# Patient Record
Sex: Female | Born: 1955 | Race: White | Hispanic: No | Marital: Single | Smoking: Never smoker
Health system: Southern US, Community
[De-identification: ages and names within clinical notes are randomized; demographics above are authoritative.]

## PROBLEM LIST (undated history)

## (undated) DIAGNOSIS — I341 Nonrheumatic mitral (valve) prolapse: Secondary | ICD-10-CM

## (undated) DIAGNOSIS — Z87442 Personal history of urinary calculi: Secondary | ICD-10-CM

## (undated) DIAGNOSIS — F32A Depression, unspecified: Secondary | ICD-10-CM

## (undated) DIAGNOSIS — D649 Anemia, unspecified: Secondary | ICD-10-CM

## (undated) DIAGNOSIS — E785 Hyperlipidemia, unspecified: Secondary | ICD-10-CM

## (undated) DIAGNOSIS — C801 Malignant (primary) neoplasm, unspecified: Secondary | ICD-10-CM

## (undated) DIAGNOSIS — R011 Cardiac murmur, unspecified: Secondary | ICD-10-CM

## (undated) DIAGNOSIS — I10 Essential (primary) hypertension: Secondary | ICD-10-CM

## (undated) DIAGNOSIS — R7303 Prediabetes: Secondary | ICD-10-CM

## (undated) DIAGNOSIS — F329 Major depressive disorder, single episode, unspecified: Secondary | ICD-10-CM

## (undated) DIAGNOSIS — G44209 Tension-type headache, unspecified, not intractable: Secondary | ICD-10-CM

## (undated) DIAGNOSIS — S72001A Fracture of unspecified part of neck of right femur, initial encounter for closed fracture: Secondary | ICD-10-CM

## (undated) DIAGNOSIS — D259 Leiomyoma of uterus, unspecified: Secondary | ICD-10-CM

## (undated) DIAGNOSIS — N959 Unspecified menopausal and perimenopausal disorder: Secondary | ICD-10-CM

## (undated) DIAGNOSIS — M25569 Pain in unspecified knee: Secondary | ICD-10-CM

## (undated) DIAGNOSIS — L719 Rosacea, unspecified: Secondary | ICD-10-CM

## (undated) DIAGNOSIS — N200 Calculus of kidney: Secondary | ICD-10-CM

## (undated) HISTORY — DX: Fracture of unspecified part of neck of right femur, initial encounter for closed fracture: S72.001A

## (undated) HISTORY — DX: Tension-type headache, unspecified, not intractable: G44.209

## (undated) HISTORY — DX: Pain in unspecified knee: M25.569

## (undated) HISTORY — PX: EYE SURGERY: SHX253

## (undated) HISTORY — DX: Rosacea, unspecified: L71.9

## (undated) HISTORY — DX: Calculus of kidney: N20.0

## (undated) HISTORY — DX: Essential (primary) hypertension: I10

## (undated) HISTORY — DX: Major depressive disorder, single episode, unspecified: F32.9

## (undated) HISTORY — DX: Leiomyoma of uterus, unspecified: D25.9

## (undated) HISTORY — DX: Cardiac murmur, unspecified: R01.1

## (undated) HISTORY — DX: Depression, unspecified: F32.A

## (undated) HISTORY — DX: Hyperlipidemia, unspecified: E78.5

## (undated) HISTORY — DX: Unspecified menopausal and perimenopausal disorder: N95.9

---

## 1997-08-19 ENCOUNTER — Encounter: Admission: RE | Admit: 1997-08-19 | Discharge: 1997-08-19 | Payer: Self-pay | Admitting: Sports Medicine

## 1997-11-26 ENCOUNTER — Encounter: Admission: RE | Admit: 1997-11-26 | Discharge: 1997-11-26 | Payer: Self-pay | Admitting: Sports Medicine

## 1998-05-24 ENCOUNTER — Encounter: Payer: Self-pay | Admitting: Internal Medicine

## 1998-05-24 ENCOUNTER — Ambulatory Visit (HOSPITAL_COMMUNITY): Admission: RE | Admit: 1998-05-24 | Discharge: 1998-05-24 | Payer: Self-pay | Admitting: Internal Medicine

## 1998-08-10 ENCOUNTER — Other Ambulatory Visit: Admission: RE | Admit: 1998-08-10 | Discharge: 1998-08-10 | Payer: Self-pay | Admitting: Obstetrics & Gynecology

## 1999-08-15 ENCOUNTER — Other Ambulatory Visit: Admission: RE | Admit: 1999-08-15 | Discharge: 1999-08-15 | Payer: Self-pay | Admitting: Obstetrics & Gynecology

## 2000-05-31 ENCOUNTER — Encounter: Payer: Self-pay | Admitting: Internal Medicine

## 2000-05-31 ENCOUNTER — Ambulatory Visit (HOSPITAL_COMMUNITY): Admission: RE | Admit: 2000-05-31 | Discharge: 2000-05-31 | Payer: Self-pay | Admitting: Internal Medicine

## 2000-09-03 ENCOUNTER — Other Ambulatory Visit: Admission: RE | Admit: 2000-09-03 | Discharge: 2000-09-03 | Payer: Self-pay | Admitting: Obstetrics & Gynecology

## 2001-04-10 ENCOUNTER — Encounter: Admission: RE | Admit: 2001-04-10 | Discharge: 2001-04-10 | Payer: Self-pay | Admitting: Sports Medicine

## 2001-06-03 ENCOUNTER — Ambulatory Visit (HOSPITAL_COMMUNITY): Admission: RE | Admit: 2001-06-03 | Discharge: 2001-06-03 | Payer: Self-pay | Admitting: Sports Medicine

## 2001-09-08 ENCOUNTER — Other Ambulatory Visit: Admission: RE | Admit: 2001-09-08 | Discharge: 2001-09-08 | Payer: Self-pay | Admitting: Obstetrics & Gynecology

## 2002-04-30 ENCOUNTER — Encounter: Admission: RE | Admit: 2002-04-30 | Discharge: 2002-04-30 | Payer: Self-pay | Admitting: Sports Medicine

## 2002-05-19 ENCOUNTER — Encounter: Admission: RE | Admit: 2002-05-19 | Discharge: 2002-05-19 | Payer: Self-pay | Admitting: Family Medicine

## 2002-06-18 ENCOUNTER — Encounter: Admission: RE | Admit: 2002-06-18 | Discharge: 2002-06-18 | Payer: Self-pay | Admitting: Family Medicine

## 2002-07-16 ENCOUNTER — Encounter: Admission: RE | Admit: 2002-07-16 | Discharge: 2002-07-16 | Payer: Self-pay | Admitting: Family Medicine

## 2002-09-11 ENCOUNTER — Other Ambulatory Visit: Admission: RE | Admit: 2002-09-11 | Discharge: 2002-09-11 | Payer: Self-pay | Admitting: Family Medicine

## 2003-05-13 ENCOUNTER — Encounter: Admission: RE | Admit: 2003-05-13 | Discharge: 2003-05-13 | Payer: Self-pay | Admitting: Sports Medicine

## 2003-07-20 ENCOUNTER — Encounter: Admission: RE | Admit: 2003-07-20 | Discharge: 2003-07-20 | Payer: Self-pay | Admitting: Family Medicine

## 2003-08-19 ENCOUNTER — Encounter: Admission: RE | Admit: 2003-08-19 | Discharge: 2003-08-19 | Payer: Self-pay | Admitting: Sports Medicine

## 2003-08-26 ENCOUNTER — Encounter: Admission: RE | Admit: 2003-08-26 | Discharge: 2003-08-26 | Payer: Self-pay | Admitting: Family Medicine

## 2003-09-09 ENCOUNTER — Encounter: Admission: RE | Admit: 2003-09-09 | Discharge: 2003-09-09 | Payer: Self-pay | Admitting: Sports Medicine

## 2003-09-13 ENCOUNTER — Other Ambulatory Visit: Admission: RE | Admit: 2003-09-13 | Discharge: 2003-09-13 | Payer: Self-pay | Admitting: Obstetrics & Gynecology

## 2004-06-08 ENCOUNTER — Ambulatory Visit: Payer: Self-pay | Admitting: Sports Medicine

## 2004-06-28 ENCOUNTER — Ambulatory Visit: Payer: Self-pay | Admitting: Family Medicine

## 2004-08-01 ENCOUNTER — Ambulatory Visit: Payer: Self-pay | Admitting: Sports Medicine

## 2004-08-07 ENCOUNTER — Encounter: Admission: RE | Admit: 2004-08-07 | Discharge: 2004-08-07 | Payer: Self-pay | Admitting: Sports Medicine

## 2004-10-05 ENCOUNTER — Other Ambulatory Visit: Admission: RE | Admit: 2004-10-05 | Discharge: 2004-10-05 | Payer: Self-pay | Admitting: Obstetrics & Gynecology

## 2005-09-20 ENCOUNTER — Ambulatory Visit: Payer: Self-pay | Admitting: Sports Medicine

## 2005-11-01 ENCOUNTER — Ambulatory Visit: Payer: Self-pay | Admitting: Family Medicine

## 2005-11-08 ENCOUNTER — Ambulatory Visit: Payer: Self-pay | Admitting: Family Medicine

## 2005-12-13 ENCOUNTER — Ambulatory Visit: Payer: Self-pay | Admitting: Sports Medicine

## 2006-06-20 DIAGNOSIS — G44209 Tension-type headache, unspecified, not intractable: Secondary | ICD-10-CM

## 2006-06-20 DIAGNOSIS — L719 Rosacea, unspecified: Secondary | ICD-10-CM

## 2006-06-20 DIAGNOSIS — D259 Leiomyoma of uterus, unspecified: Secondary | ICD-10-CM | POA: Insufficient documentation

## 2006-06-20 HISTORY — DX: Rosacea, unspecified: L71.9

## 2006-06-20 HISTORY — DX: Tension-type headache, unspecified, not intractable: G44.209

## 2006-06-20 HISTORY — DX: Leiomyoma of uterus, unspecified: D25.9

## 2006-09-26 ENCOUNTER — Ambulatory Visit: Payer: Self-pay | Admitting: Sports Medicine

## 2006-09-26 DIAGNOSIS — F329 Major depressive disorder, single episode, unspecified: Secondary | ICD-10-CM

## 2006-09-26 DIAGNOSIS — F324 Major depressive disorder, single episode, in partial remission: Secondary | ICD-10-CM | POA: Insufficient documentation

## 2006-09-26 DIAGNOSIS — I10 Essential (primary) hypertension: Secondary | ICD-10-CM | POA: Insufficient documentation

## 2006-09-26 DIAGNOSIS — M542 Cervicalgia: Secondary | ICD-10-CM | POA: Insufficient documentation

## 2006-09-26 LAB — CONVERTED CEMR LAB
Albumin: 4.7 g/dL (ref 3.5–5.2)
Alkaline Phosphatase: 41 units/L (ref 39–117)
BUN: 13 mg/dL (ref 6–23)
CO2: 27 meq/L (ref 19–32)
Cholesterol: 189 mg/dL (ref 0–200)
Glucose, Bld: 85 mg/dL (ref 70–99)
HCT: 36.8 %
HDL: 58 mg/dL (ref >39)
Hemoglobin: 13 g/dL
LDL Cholesterol: 119 mg/dL — ABNORMAL HIGH (ref 0–99)
Total Bilirubin: 1.6 mg/dL — ABNORMAL HIGH (ref 0.3–1.2)
Triglycerides: 62 mg/dL (ref <150)
WBC: 4.9 10*3/uL

## 2006-10-01 ENCOUNTER — Encounter (INDEPENDENT_AMBULATORY_CARE_PROVIDER_SITE_OTHER): Payer: Self-pay | Admitting: *Deleted

## 2006-10-02 ENCOUNTER — Telehealth: Payer: Self-pay | Admitting: *Deleted

## 2006-10-02 ENCOUNTER — Telehealth (INDEPENDENT_AMBULATORY_CARE_PROVIDER_SITE_OTHER): Payer: Self-pay | Admitting: *Deleted

## 2006-10-31 ENCOUNTER — Ambulatory Visit: Payer: Self-pay | Admitting: Sports Medicine

## 2006-11-12 ENCOUNTER — Encounter: Payer: Self-pay | Admitting: Family Medicine

## 2006-11-12 LAB — CONVERTED CEMR LAB

## 2006-12-02 ENCOUNTER — Encounter: Payer: Self-pay | Admitting: Family Medicine

## 2006-12-02 LAB — CONVERTED CEMR LAB

## 2006-12-17 ENCOUNTER — Encounter: Payer: Self-pay | Admitting: Sports Medicine

## 2007-01-09 ENCOUNTER — Ambulatory Visit: Payer: Self-pay | Admitting: Sports Medicine

## 2007-07-21 ENCOUNTER — Ambulatory Visit: Payer: Self-pay | Admitting: Sports Medicine

## 2007-10-09 ENCOUNTER — Ambulatory Visit: Payer: Self-pay | Admitting: Sports Medicine

## 2007-10-09 LAB — CONVERTED CEMR LAB
ALT: 16 units/L (ref 0–35)
AST: 16 units/L (ref 0–37)
Alkaline Phosphatase: 40 units/L (ref 39–117)
BUN: 13 mg/dL (ref 6–23)
Calcium: 10.1 mg/dL (ref 8.4–10.5)
Chloride: 103 meq/L (ref 96–112)
Creatinine, Ser: 0.69 mg/dL (ref 0.40–1.20)
HCT: 38.9 % (ref 36.0–46.0)
MCHC: 32.4 g/dL (ref 30.0–36.0)
Platelets: 258 10*3/uL (ref 150–400)
RDW: 13 % (ref 11.5–15.5)
Total Bilirubin: 1.7 mg/dL — ABNORMAL HIGH (ref 0.3–1.2)

## 2007-10-10 ENCOUNTER — Encounter: Payer: Self-pay | Admitting: Sports Medicine

## 2007-10-10 LAB — CONVERTED CEMR LAB
HDL: 61 mg/dL (ref >39)
LDL Cholesterol: 130 mg/dL — ABNORMAL HIGH (ref 0–99)
Total CHOL/HDL Ratio: 3.3
Triglycerides: 44 mg/dL (ref <150)
VLDL: 9 mg/dL (ref 0–40)

## 2008-01-14 ENCOUNTER — Telehealth: Payer: Self-pay | Admitting: *Deleted

## 2008-01-14 ENCOUNTER — Ambulatory Visit: Payer: Self-pay | Admitting: Family Medicine

## 2008-01-26 ENCOUNTER — Encounter: Payer: Self-pay | Admitting: Family Medicine

## 2008-04-02 ENCOUNTER — Ambulatory Visit: Payer: Self-pay | Admitting: Sports Medicine

## 2008-09-28 ENCOUNTER — Encounter: Payer: Self-pay | Admitting: Family Medicine

## 2008-10-04 ENCOUNTER — Telehealth: Payer: Self-pay | Admitting: *Deleted

## 2008-10-21 ENCOUNTER — Ambulatory Visit: Payer: Self-pay | Admitting: Family Medicine

## 2008-10-28 ENCOUNTER — Encounter: Payer: Self-pay | Admitting: Family Medicine

## 2008-10-28 ENCOUNTER — Ambulatory Visit: Payer: Self-pay | Admitting: Family Medicine

## 2008-10-28 LAB — CONVERTED CEMR LAB
ALT: 17 units/L (ref 0–35)
AST: 14 units/L (ref 0–37)
CO2: 26 meq/L (ref 19–32)
Calcium: 9.6 mg/dL (ref 8.4–10.5)
Chloride: 103 meq/L (ref 96–112)
Cholesterol: 227 mg/dL — ABNORMAL HIGH (ref 0–200)
Creatinine, Ser: 0.83 mg/dL (ref 0.40–1.20)
Platelets: 279 10*3/uL (ref 150–400)
Potassium: 4.7 meq/L (ref 3.5–5.3)
RDW: 12.9 % (ref 11.5–15.5)
Sodium: 141 meq/L (ref 135–145)
Total CHOL/HDL Ratio: 3.6
Total Protein: 7.2 g/dL (ref 6.0–8.3)
WBC: 5.4 10*3/uL (ref 4.0–10.5)

## 2008-11-01 ENCOUNTER — Encounter: Payer: Self-pay | Admitting: Family Medicine

## 2009-01-28 ENCOUNTER — Encounter: Payer: Self-pay | Admitting: Family Medicine

## 2009-01-28 LAB — CONVERTED CEMR LAB

## 2009-03-03 ENCOUNTER — Emergency Department (HOSPITAL_COMMUNITY): Admission: EM | Admit: 2009-03-03 | Discharge: 2009-03-04 | Payer: Self-pay | Admitting: Emergency Medicine

## 2009-03-04 ENCOUNTER — Encounter: Payer: Self-pay | Admitting: Family Medicine

## 2009-03-24 ENCOUNTER — Encounter: Payer: Self-pay | Admitting: Family Medicine

## 2009-03-31 ENCOUNTER — Encounter: Payer: Self-pay | Admitting: Family Medicine

## 2009-05-13 ENCOUNTER — Encounter: Payer: Self-pay | Admitting: Family Medicine

## 2009-10-28 ENCOUNTER — Encounter: Payer: Self-pay | Admitting: Family Medicine

## 2009-10-31 ENCOUNTER — Ambulatory Visit: Payer: Self-pay | Admitting: Family Medicine

## 2009-10-31 DIAGNOSIS — L255 Unspecified contact dermatitis due to plants, except food: Secondary | ICD-10-CM | POA: Insufficient documentation

## 2009-11-03 ENCOUNTER — Ambulatory Visit: Payer: Self-pay | Admitting: Family Medicine

## 2009-11-07 ENCOUNTER — Ambulatory Visit: Payer: Self-pay | Admitting: Family Medicine

## 2009-11-07 DIAGNOSIS — N959 Unspecified menopausal and perimenopausal disorder: Secondary | ICD-10-CM | POA: Insufficient documentation

## 2009-11-07 DIAGNOSIS — M25569 Pain in unspecified knee: Secondary | ICD-10-CM

## 2009-11-07 DIAGNOSIS — R635 Abnormal weight gain: Secondary | ICD-10-CM | POA: Insufficient documentation

## 2009-11-07 HISTORY — DX: Unspecified menopausal and perimenopausal disorder: N95.9

## 2009-11-07 HISTORY — DX: Pain in unspecified knee: M25.569

## 2009-11-07 LAB — CONVERTED CEMR LAB
BUN: 22 mg/dL (ref 6–23)
Chloride: 103 meq/L (ref 96–112)
Direct LDL: 135 mg/dL — ABNORMAL HIGH
Glucose, Bld: 82 mg/dL (ref 70–99)
Potassium: 4 meq/L (ref 3.5–5.3)
Sodium: 142 meq/L (ref 135–145)

## 2009-11-21 ENCOUNTER — Encounter: Payer: Self-pay | Admitting: Family Medicine

## 2009-11-23 ENCOUNTER — Encounter: Payer: Self-pay | Admitting: Family Medicine

## 2009-11-29 ENCOUNTER — Encounter: Payer: Self-pay | Admitting: Family Medicine

## 2009-12-02 ENCOUNTER — Encounter: Payer: Self-pay | Admitting: Family Medicine

## 2010-01-24 ENCOUNTER — Inpatient Hospital Stay (HOSPITAL_COMMUNITY): Admission: EM | Admit: 2010-01-24 | Discharge: 2010-01-27 | Payer: Self-pay | Admitting: Emergency Medicine

## 2010-01-24 HISTORY — PX: OTHER SURGICAL HISTORY: SHX169

## 2010-05-23 NOTE — Assessment & Plan Note (Signed)
Summary: f/u mon visit/eo   Vital Signs:  Patient profile:   55 year old female Height:      65.0 inches Weight:      151 pounds BMI:     25.22 BSA:     1.76 Temp:     98.3 degrees F Pulse rate:   85 / minute BP sitting:   145 / 83  Vitals Entered By: Jone Baseman CMA (November 03, 2009 9:04 AM) CC: f/u poison ivy Is Patient Diabetic? No Pain Assessment Patient in pain? no        Primary Care Provider:  Sarah Swaziland MD  CC:  f/u poison ivy.  History of Present Illness: poison ivy: overall reports it is less itchy.  she has been taking meds as prescribed.  also less swollen.  she denies fevers.  she feels some of the spots are starting to heal up as well.   Habits & Providers  Alcohol-Tobacco-Diet     Tobacco Status: never  Current Medications (verified): 1)  Fluoxetine Hcl 20 Mg  Caps (Fluoxetine Hcl) .Marland Kitchen.. 1 By Mouth Qd 2)  Lisinopril 10 Mg  Tabs (Lisinopril) .Marland Kitchen.. 1 By Mouth Qd 3)  Flexeril 10 Mg Tabs (Cyclobenzaprine Hcl) .... 1/2 To 1 Tablet By Mouth Three Times A Day As Needed Low Back Pain. 4)  Ibuprofen 800 Mg Tabs (Ibuprofen) .... One Tab By Mouth Q8 Hours As Needed Low Back Pain 5)  Tramadol Hcl 50 Mg Tabs (Tramadol Hcl) .... Take One Tab Q6h As Needed Pain 6)  Doxycycline Hyclate 100 Mg Tabs (Doxycycline Hyclate) .Marland Kitchen.. 1 By Mouth Two Times A Day For 10 Days 7)  Prednisone 10 Mg Tabs (Prednisone) .... Taper As Directed  Allergies (verified): No Known Drug Allergies  Past History:  Past medical, surgical, family and social histories (including risk factors) reviewed for relevance to current acute and chronic problems.  Past Medical History: fracture of shoulder in college  pruritis vulvae  rosacea  migraine headaches for which execedrin mig form works - while father dying  NECK PAIN, CHRONIC (ICD-723.1) DISORDER, DEPRESSIVE NEC (ICD-311), Anxiety disorder HYPERTENSION (ICD-401.9) UTERINE FIBROID (ICD-218.9)  h/o microscopic hematuria followed by  [facility]   Past Surgical History: Reviewed history from 10/09/2007 and no changes required. Colonoscopy - 04/23/2000   ETT  9.7 METs - 05/24/2001/ no repeat since then   TAB - 04/30/2002  Family History: Reviewed history from 10/21/2008 and no changes required. father - pancreatic cancer, angioplasty, hip fracture died at 1; HBP   mother - HBP, cough age 71 - still active/ living in retirement home   sister - breast cancer/ thyroid probs  (premenopausal)  Brother - healthy  Social History: Reviewed history from 10/09/2007 and no changes required. 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  no more yellow crusting.  mild surrounding erythema but no longer warm.  mildly indurated however still.  no fluctance.  along remainder of left forearm and along right wrist and right medial knees are linear streaks of blisters that appear to be healing   Impression & Recommendations:  Problem # 1:  RHUS DERMATITIS (ICD-692.6) Assessment Improved  overall improving. continue course. has appt monday for routine physical so can follow up at that time.  precepted with Dr Leveda Anna who agrees with above  Her updated medication list for this problem includes:    Prednisone 10 Mg Tabs (Prednisone) .Marland Kitchen... Taper as directed  Orders: [facility]- Est Level  3 (44010)  Complete Medication List: 1)  Fluoxetine Hcl 20 Mg Caps (Fluoxetine hcl) .Marland Kitchen.. 1 by mouth qd 2)  Lisinopril 10 Mg Tabs (Lisinopril) .Marland Kitchen.. 1 by mouth qd 3)  Flexeril 10 Mg Tabs (Cyclobenzaprine hcl)  .... 1/2 to 1 tablet by mouth three times a day as needed low back pain. 4)  Ibuprofen 800 Mg Tabs (Ibuprofen) .... One tab by mouth q8 hours as needed low back pain 5)  Tramadol Hcl 50 Mg Tabs (Tramadol hcl) .... Take one tab q6h as needed pain 6)  Doxycycline Hyclate 100 Mg Tabs (Doxycycline hyclate) .Marland Kitchen.. 1 by mouth two times a day for 10 days 7)  Prednisone 10 Mg Tabs (Prednisone) .... Taper as directed

## 2010-05-23 NOTE — Letter (Signed)
Summary: Generic Letter  [facility]  [address]   [place], [place] [address]   Phone: [phone]  Fax: [phone]    11/21/2009  April Castillo [address] [place], [place]  [address]  Dear April Castillo, Your LDL cholesterol was 135 at your last visit.  This is fine.  I would continue to try to eat plenty of fruits and vegetables and to try to include exercise in your day.  It was nice to see you last week.  Please feel free to contact us if you need anything.  Sincerely,   Garyson Stelly Swaziland MD  Appended Document: Generic Letter mailed

## 2010-05-23 NOTE — Assessment & Plan Note (Signed)
Summary: cpp/eo   Vital Signs:  Patient profile:   55 year old female Weight:      150 pounds Temp:     98.7 degrees F oral Pulse rate:   71 / minute Pulse rhythm:   regular BP sitting:   150 / 81  (right arm) Cuff size:   regular  Vitals Entered By: Loralee Pacas CMA (November 07, 2009 9:33 AM)  Serial Vital Signs/Assessments:  Time      Position  BP       Pulse  Resp  Temp     By                     142/74                         Sarah Swaziland MD  CC: cpe   Primary Care Provider:  Sarah Swaziland MD  CC:  cpe.  History of Present Illness: Pt here for CPE.  Gets paps, mammograms through Dr. Arlyce Dice.  Also with following issues:  Knee pain - [place] (s[place]pped) onto knee.  Was fine, but a few weeks ago felt sharp pain when kneeling in garden, as though glass was in knee. Felt knee was a little swollen at that time, but no redness.  No pain with walking or climbing stairs. Since then, has been careful squatting and kneeling and knee feels fine.  Feeling bloated for a few days.  Has been on prednisone since last week and feels menses is coming.  Hot flashes - not really bothering her.  Not keeping her up at night.  Some vaginal dryness, but not affecting her life.  Has irreg menses, mostly spotting every few months.    Depression/anxiety  is well controlled.  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  No problems with her HTN meds.  No chest pain or trouble breathing.    Current Medications (verified): 1)  Fluoxetine Hcl 20 Mg  Caps (Fluoxetine Hcl) .Marland Kitchen.. 1 By Mouth Qd 2)  Lisinopril 10 Mg  Tabs (Lisinopril) .Marland Kitchen.. 1 By Mouth Qd 3)  Ibuprofen 800 Mg Tabs (Ibuprofen) .... One Tab By Mouth Q8 Hours As Needed Low Back Pain 4)  Tramadol Hcl 50 Mg Tabs (Tramadol Hcl) .... Take Daily Po As Needed Pain 5)  Doxycycline Hyclate 100 Mg Tabs (Doxycycline Hyclate) .Marland Kitchen.. 1 By Mouth Two Times A Day For 10 Days 6)  Prednisone 10 Mg Tabs (Prednisone) .... Taper As  Directed  Allergies (verified): No Known Drug Allergies  Family History: father - pancreatic cancer, angioplasty, hip fracture died at 66; HBP   mother - HBP, cough age 24 - still active/ living in retirement home   sister - breast cancer - mastectomy 30 years ago/ thyroid probs  (premenopausal) alive and well.  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  The patient denies anorexia, fever, weight loss, vision loss, decreased hearing, chest pain, syncope, dyspnea on exertion, prolonged cough, headaches, hemoptysis, abdominal pain, melena, hematochezia, hematuria, incontinence, genital sores, suspicious skin lesions, depression, abnormal bleeding, and breast masses.    Physical Exam  General:  Well-developed,well-nourished,in  no acute distress; alert,appropriate and cooperative throughout examination Head:  Normocephalic and atraumatic without obvious abnormalities. No apparent alopecia or balding. Eyes:  No corneal or conjunctival inflammation noted. EOMI. Perrla.  Vision grossly normal. Ears:  External ear exam shows no significant lesions or deformities.  Otoscopic examination reveals clear canals, tympanic membranes are intact bilaterally without bulging, retraction, inflammation or discharge. Hearing is grossly normal bilaterally. Mouth:  Oral mucosa and oropharynx without lesions or exudates.  Teeth in good repair. Breasts:  No mass, nodules, thickening, tenderness, bulging, retraction, inflamation, nipple discharge or skin changes noted.   Lungs:  Normal respiratory effort, chest expands symmetrically. Lungs are clear to auscultation, no crackles or wheezes. Heart:  Normal rate and regular rhythm.  S1 and S2 normal without gallop, murmur, click, rub or other extra sounds. Abdomen:  Bowel sounds positive,abdomen soft and non-tender without masses, organomegaly or hernias noted. Msk:  B knees FROM.  Stable ant/post/med/lat.  No erythema or swelling noted. Skin:  Healing areas of plaques L forearm and R leg.  No excoriations,  no surrounding erythema.   Psych:  Cognition and judgment appear intact. Alert and cooperative with normal attention span and concentration. No apparent delusions, illusions, hallucinations   Impression & Recommendations:  Problem # 1:  Preventive Health Care (ICD-V70.0) Doing well.  Due for colonoscopy - she will check with co-workers for [employer] on GI doc with easier prep.  Due for mammogram, but can schedule self at [facility].  Breast exam benign today.  Problem # 2:  PERIMENOPAUSAL SYNDROME (ICD-627.9)  Discussed with pt. She declines any pharmacologic treatment today.  Orders: [facility] - Est  40-64 yrs ([address]81)  Problem # 3:  HYPERTENSION (ICD-401.9) Refill meds.  Repeat 142/74.  Pt would like to try dietary changes and increasing exercise before making med adjustments, and pt is so close to goal that I think this is reasonable.  Check labs today. Her updated medication list for this problem includes:    Lisinopril 10 Mg Tabs (Lisinopril) .Marland Kitchen... 1 by mouth qd  Orders: T-Basic Metabolic Panel [id number]) Direct LDL-[facility] [phone]) [facility] - Est  40-64 yrs ([address]29)  Problem # 4:  DISORDER, DEPRESSIVE NEC (ICD-311)  With anxiety.  Well controlled.  Refill meds. Her updated medication list for this problem includes:    Fluoxetine Hcl 20 Mg Caps (Fluoxetine hcl) .Marland Kitchen... 1 by mouth qd  Orders: [facility] - Est  40-64 yrs ([address])  Problem # 5:  KNEE PAIN, LEFT (ICD-719.46)  Exam benign.  F/u if worsens or affects her activities. The following medications were removed from the medication list:    Flexeril 10 Mg Tabs (Cyclobenzaprine hcl) .Marland Kitchen... 1/2 to 1 tablet by mouth  three times a day as needed low back pain. Her updated medication list for this problem includes:    Ibuprofen 800 Mg Tabs (Ibuprofen) ..... One tab by mouth q8 hours as needed low back pain    Tramadol Hcl 50 Mg Tabs (Tramadol hcl) .Marland Kitchen... Take daily po as needed pain  Orders: [facility] - Est  40-64 yrs ([address]40)  Problem # 6:  WEIGHT GAIN (ICD-783.1)  Pt feels this is due to decreasing exercise as she is now working from home.  Encouraged to try to increase exercise.  If she loses soem weight, this will likely help with HTN as well.    Orders: [facility] - Est  40-64 yrs ([address])  Problem # 7:  RHUS DERMATITIS (ICD-692.6)  Much improved.  C/W prednisone taper.   Her updated  medication list for this problem includes:    Prednisone 10 Mg Tabs (Prednisone) .Marland Kitchen... Taper as directed  Orders: [facility] - Est  40-64 yrs ([address])  Complete Medication List: 1)  Fluoxetine Hcl 20 Mg Caps (Fluoxetine hcl) .Marland Kitchen.. 1 by mouth qd 2)  Lisinopril 10 Mg Tabs (Lisinopril) .Marland Kitchen.. 1 by mouth qd 3)  Ibuprofen 800 Mg Tabs (Ibuprofen) .... One tab by mouth q8 hours as needed low back pain 4)  Tramadol Hcl 50 Mg Tabs (Tramadol hcl) .... Take daily po as needed pain 5)  Doxycycline Hyclate 100 Mg Tabs (Doxycycline hyclate) .Marland Kitchen.. 1 by mouth two times a day for 10 days 6)  Prednisone 10 Mg Tabs (Prednisone) .... Taper as directed  Patient Instructions: 1)  It was good to see you today. 2)  I think you are smart to try to add some exercise in to your day and to watch your intake of salt.  Fruits and veggies are great for you, too, and 5 servings a day  (or more) are suggested. 3)  Come see [place] in a year or sooner if you need [place].   Prevention & Chronic Care Immunizations   Influenza vaccine: Not documented    Tetanus booster: Not documented    Pneumococcal vaccine: Not documented  Colorectal Screening   Hemoccult: Not documented    Colonoscopy: Not documented  Other Screening   Pap smear: Not documented    Mammogram: Not  documented   Smoking status: never  (11/03/2009)  Lipids   Total Cholesterol: 227  (10/28/2008)   LDL: 148  (10/28/2008)   LDL Direct: Not documented   HDL: 63  (10/28/2008)   Triglycerides: 82  (10/28/2008)  Hypertension   Last Blood Pressure: 150 / 81  (11/07/2009)   Serum creatinine: 0.83  (10/28/2008)   BMP action: Ordered   Serum potassium 4.7  (10/28/2008)    Hypertension flowsheet reviewed?: Yes   Progress toward BP goal: At goal  Self-Management Support :    Hypertension self-management support: Not documented    Hypertension self-management support not done because: Good outcomes  (11/07/2009)

## 2010-05-23 NOTE — Miscellaneous (Signed)
Summary: paps and mammograms entered.     Clinical Lists Changes  Observations: Added new observation of DM PROGRESS: N/A (11/29/2009 15:12) Added new observation of DM FSREVIEW: N/A (11/29/2009 15:12) Added new observation of LIPID PROGRS: N/A (11/29/2009 15:12) Added new observation of LIPID FSREVW: N/A (11/29/2009 15:12) Added new observation of PAP SMEAR: Specimen Adequacy: Satisfactory for evaluation.   Interpretation/Result:Negative for intraepithelial Lesion or Malignancy.    (01/28/2009 15:16) Added new observation of PAP SMEAR: Specimen Adequacy: Satisfactory for evaluation.   Interpretation/Result:Negative for intraepithelial Lesion or Malignancy.    (01/26/2008 15:32) Added new observation of PAP SMEAR: Specimen Adequacy: Satisfactory for evaluation.   Interpretation/Result:Negative for intraepithelial Lesion or Malignancy.    (12/02/2006 15:44) Added new observation of MAMMOGRAM: No specific mammographic evidence of malignancy.  Assessment: BIRADS 1.  (12/02/2006 15:43) Added new observation of PAP SMEAR: Specimen Adequacy: Satisfactory for evaluation.   Interpretation/Result:Negative for intraepithelial Lesion or Malignancy.    (11/12/2006 15:46)      Prevention & Chronic Care Immunizations   Influenza vaccine: Not documented    Tetanus booster: Not documented    Pneumococcal vaccine: Not documented  Colorectal Screening   Hemoccult: Not documented    Colonoscopy: Not documented  Other Screening   Pap smear: Specimen Adequacy: Satisfactory for evaluation.   Interpretation/Result:Negative for intraepithelial Lesion or Malignancy.     (01/28/2009)    Mammogram: No specific mammographic evidence of malignancy.  Assessment: BIRADS 1.   (12/02/2006)   Smoking status: never  (11/03/2009)  Lipids   Total Cholesterol: 227  (10/28/2008)   LDL: 148  (10/28/2008)   LDL Direct: 135  (11/07/2009)   HDL: 63  (10/28/2008)   Triglycerides: 82   (10/28/2008)  Hypertension   Last Blood Pressure: 150 / 81  (11/07/2009)   Serum creatinine: 0.75  (11/07/2009)   BMP action: Ordered   Serum potassium 4.0  (11/07/2009)  Self-Management Support :    Hypertension self-management support: Not documented    Hypertension self-management support not done because: Good outcomes  (11/07/2009)   Pap Smear  Procedure date:  01/28/2009  Findings:      Specimen Adequacy: Satisfactory for evaluation.   Interpretation/Result:Negative for intraepithelial Lesion or Malignancy.     Pap Smear  Procedure date:  01/26/2008  Findings:      Specimen Adequacy: Satisfactory for evaluation.   Interpretation/Result:Negative for intraepithelial Lesion or Malignancy.     Mammogram  Procedure date:  12/02/2006  Findings:      No specific mammographic evidence of malignancy.  Assessment: BIRADS 1.   Pap Smear  Procedure date:  11/12/2006  Findings:      Specimen Adequacy: Satisfactory for evaluation.   Interpretation/Result:Negative for intraepithelial Lesion or Malignancy.    Last 3 paps entered.  Also with neg paps on: 10/11/05 10/04/04 09/14/03 09/11/02 09/08/01 09/03/00 08/15/99   Neg mammograms on 10/02/03 09/24/02 (repeat from 09/11/02, which was Bi-Rads 0) 04/07/01 06/06/00 (with ultrasound for palpable mass)

## 2010-05-23 NOTE — Assessment & Plan Note (Signed)
Summary: poison ivy,df   Vital Signs:  Patient profile:   55 year old female Height:      65.0 inches Weight:      145 pounds BMI:     24.22 BSA:     1.73 Temp:     98.2 degrees F Pulse rate:   78 / minute BP sitting:   120 / 76  Vitals Entered By: Jone Baseman CMA (October 31, 2009 10:07 AM) CC: poison ivy x 5 days Is Patient Diabetic? No Pain Assessment Patient in pain? no        Primary Care Provider:  Sarah Swaziland MD  CC:  poison ivy x 5 days.  History of Present Illness: poison ivy: broke out with it about 5 days ago. started as what she thought was a bug bite (she does spend a great deal of time outdoors and around horses) but then later this area on left forearm got swollen, painful and  she developed streaks of blisters around it, around wrists bilaterally and on knees bilaterally consistent with previous poison ivy exposures. she has been using ivyrid (calamine lotion), neosporin and benadryl cream.  when this didn't help and main lesion on left forearm got worse she [facility] and was dx with infection and started on PCN VK.  she has been taking this but despite this she thinks it continues to get worse.  the rash is very itchy. it is also warm over main lesion left forearm. she denies systemic symptoms and denies fevers  Habits & Providers  Alcohol-Tobacco-Diet     Tobacco Status: never  Current Medications (verified): 1)  Fluoxetine Hcl 20 Mg  Caps (Fluoxetine Hcl) .Marland Kitchen.. 1 By Mouth Qd 2)  Lisinopril 10 Mg  Tabs (Lisinopril) .Marland Kitchen.. 1 By Mouth Qd 3)  Flexeril 10 Mg Tabs (Cyclobenzaprine Hcl) .... 1/2 To 1 Tablet By Mouth Three Times A Day As Needed Low Back Pain. 4)  Ibuprofen 800 Mg Tabs (Ibuprofen) .... One Tab By Mouth Q8 Hours As Needed Low Back Pain 5)  Tramadol Hcl 50 Mg Tabs (Tramadol Hcl) .... Take One Tab Q6h As Needed Pain 6)  Doxycycline Hyclate 100 Mg Tabs (Doxycycline Hyclate) .Marland Kitchen.. 1 By Mouth Two Times A Day For 10 Days 7)  Prednisone 10  Mg Tabs (Prednisone) .... Taper As Directed  Allergies (verified): No Known Drug Allergies  Past History:  Past medical, surgical, family and social histories (including risk factors) reviewed for relevance to current acute and chronic problems.  Past Medical History: fracture of shoulder in college  pruritis vulvae  rosacea  migraine headaches for which execedrin mig form works - while father dying  NECK PAIN, CHRONIC (ICD-723.1) DISORDER, DEPRESSIVE NEC (ICD-311) HYPERTENSION (ICD-401.9) UTERINE FIBROID (ICD-218.9)  Past Surgical History: Reviewed history from 10/09/2007 and no changes required. Colonoscopy - 04/23/2000   ETT  9.7 METs - 05/24/2001/ no repeat since then   TAB - 04/30/2002  Family History: Reviewed history from 10/21/2008 and no changes required. father - pancreatic cancer, angioplasty, hip fracture died at 53; HBP   mother - HBP, cough age 15 - still active/ living in retirement home   sister - breast cancer/ thyroid probs  (premenopausal)  Brother - healthy  Social History: Reviewed history from 10/09/2007 and no changes required. 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  no fluctance.  along remainder of left forearm and along right wrist and bilateral medial knees are linear streaks of blisters with some yellow crusting   Impression & Recommendations:  Problem # 1:  RHUS DERMATITIS (ICD-692.6) Assessment New  given that main lesions looks  infected and isn't getting better with PCN will cover for possible MRSA with doxycycline.  to help with associated inflammation and itching will add prednisone taper as well.  keep lesion clean and if desired with ointment - polysporin, bacitracin or vaseline (i wonder if she has some irritation from neomycin in neosporin) f/u later this week or if worsens sooner.   Her updated medication list for this problem includes:    Prednisone 10 Mg Tabs (Prednisone) .Marland Kitchen... Taper as directed  Orders: [facility]- Est Level  3 ([address])  Complete Medication List: 1)  Fluoxetine Hcl 20 Mg Caps (Fluoxetine hcl) .Marland Kitchen.. 1 by mouth qd 2)  Lisinopril 10 Mg Tabs (Lisinopril) .Marland Kitchen.. 1 by mouth qd 3)  Flexeril 10 Mg Tabs (Cyclobenzaprine hcl) .... 1/2 to 1 tablet by mouth three times a day as needed low back pain. 4)  Ibuprofen 800 Mg Tabs (Ibuprofen) .... One tab by mouth q8 hours as needed low back pain 5)  Tramadol Hcl 50 Mg Tabs (Tramadol hcl) .... Take one tab q6h as needed pain 6)  Doxycycline Hyclate 100 Mg Tabs (Doxycycline hyclate) .Marland Kitchen.. 1 by mouth two times a day for 10 days 7)  Prednisone 10 Mg Tabs (Prednisone) .... Taper as directed  Patient Instructions: 1)  Please follow up thursday or friday to see how your infection/poison ivy is doing. 2)  Take the antibiotic - it makes you senstiive to the sun so be careful. 3)  Take the prednisone to help calm down the poison ivy - it will make you hungry and feel a little jittery.  it may also keep you awake at night.  just be prepared for these symptoms.  4)  Directions for the prednisone: 6 tabs by mouth daily for 2 days then 5 tabs/day for 2 days then 4 tabs/day for 2d then 3/day for 2d then 2/day for 2d then 1/day for 2d then 1/2 for 2d and stop.  5)  Call if things get worse. Prescriptions: PREDNISONE 10 MG TABS (PREDNISONE) taper as directed  #43 x 0   Entered and Authorized by:   Ancil Boozer  MD   Signed by:   Ancil Boozer  MD on 10/31/2009   Method used:    Electronically to        [facility]. [phone]* (retail)       [address]       [place], [place]  [address]       Ph: [phone]       Fax: [phone]   RxID:   [id number] DOXYCYCLINE HYCLATE 100 MG TABS (DOXYCYCLINE HYCLATE) 1 by mouth two times a day for 10 days  #20 x 0   Entered and Authorized by:   Ancil Boozer  MD   Signed by:   Ancil Boozer  MD on 10/31/2009   Method used:   Electronically  to        [facility]. [phone]* (retail)       [address]       [place], [place]  [address]       Ph: [phone]       Fax: [phone]   RxID:   [id number]

## 2010-05-23 NOTE — Consult Note (Signed)
[facility]   Im[facility] By: Clydell Hakim 11/22/2009 16:14:22  _____________________________________________________________________  External Attachment:    Type:   Image     Comment:   External Document

## 2010-05-23 NOTE — Miscellaneous (Signed)
Summary: [facility]   Imported By: Bradly Bienenstock 11/23/2009 12:54:18  _____________________________________________________________________  External Attachment:    Type:   Image     Comment:   External Document

## 2010-05-26 NOTE — Consult Note (Signed)
Summary: [facility]   Imported By: Clydell Hakim 05/18/2009 11:54:29  _____________________________________________________________________  External Attachment:    Type:   Image     Comment:   External Document

## 2010-07-06 LAB — CBC
Platelets: 268 10*3/uL (ref 150–400)
RBC: 3.81 MIL/uL — ABNORMAL LOW (ref 3.87–5.11)
RDW: 12.8 % (ref 11.5–15.5)
WBC: 9.6 10*3/uL (ref 4.0–10.5)

## 2010-07-06 LAB — BASIC METABOLIC PANEL
BUN: 19 mg/dL (ref 6–23)
Creatinine, Ser: 0.93 mg/dL (ref 0.4–1.2)
GFR calc Af Amer: >60 mL/min (ref >60)
GFR calc non Af Amer: >60 mL/min (ref >60)
Potassium: 3.7 mEq/L (ref 3.5–5.1)

## 2010-07-06 LAB — DIFFERENTIAL
Basophils Absolute: 0 10*3/uL (ref 0.0–0.1)
Lymphocytes Relative: 16 % (ref 12–46)
Lymphs Abs: 1.6 10*3/uL (ref 0.7–4.0)
Neutro Abs: 7.5 10*3/uL (ref 1.7–7.7)
Neutrophils Relative %: 78 % — ABNORMAL HIGH (ref 43–77)

## 2010-07-06 LAB — ABO/RH: ABO/RH(D): A POS

## 2010-07-06 LAB — TYPE AND SCREEN
ABO/RH(D): A POS
Antibody Screen: NEGATIVE

## 2010-07-06 LAB — PROTIME-INR: INR: 1.04 (ref 0.00–1.49)

## 2010-07-26 LAB — URINALYSIS, ROUTINE W REFLEX MICROSCOPIC
Glucose, UA: NEGATIVE mg/dL
Protein, ur: NEGATIVE mg/dL
Specific Gravity, Urine: 1.017 (ref 1.005–1.030)
pH: 7.5 (ref 5.0–8.0)

## 2010-09-23 ENCOUNTER — Encounter: Payer: Self-pay | Admitting: Gastroenterology

## 2010-11-23 ENCOUNTER — Ambulatory Visit (INDEPENDENT_AMBULATORY_CARE_PROVIDER_SITE_OTHER): Payer: Managed Care, Other (non HMO) | Admitting: Family Medicine

## 2010-11-23 ENCOUNTER — Encounter: Payer: Self-pay | Admitting: Family Medicine

## 2010-11-23 DIAGNOSIS — N6310 Unspecified lump in the right breast, unspecified quadrant: Secondary | ICD-10-CM | POA: Insufficient documentation

## 2010-11-23 DIAGNOSIS — F329 Major depressive disorder, single episode, unspecified: Secondary | ICD-10-CM

## 2010-11-23 DIAGNOSIS — S72009A Fracture of unspecified part of neck of unspecified femur, initial encounter for closed fracture: Secondary | ICD-10-CM

## 2010-11-23 DIAGNOSIS — I10 Essential (primary) hypertension: Secondary | ICD-10-CM

## 2010-11-23 DIAGNOSIS — S72001A Fracture of unspecified part of neck of right femur, initial encounter for closed fracture: Secondary | ICD-10-CM

## 2010-11-23 DIAGNOSIS — N63 Unspecified lump in unspecified breast: Secondary | ICD-10-CM

## 2010-11-23 DIAGNOSIS — F3289 Other specified depressive episodes: Secondary | ICD-10-CM

## 2010-11-23 HISTORY — DX: Fracture of unspecified part of neck of right femur, initial encounter for closed fracture: S72.001A

## 2010-11-23 MED ORDER — FLUOXETINE HCL 20 MG PO CAPS: 20.0000 mg | ORAL_CAPSULE | Freq: Every day | ORAL | Status: DC

## 2010-11-23 MED ORDER — LISINOPRIL 10 MG PO TABS: 10.0000 mg | ORAL_TABLET | Freq: Every day | ORAL | Status: DC

## 2010-11-23 NOTE — Assessment & Plan Note (Signed)
Traumatic fracture.  Will check vit D level.

## 2010-11-23 NOTE — Assessment & Plan Note (Signed)
WEll controlled on low does of lisinopril.  Check BMP and lipids today.

## 2010-11-23 NOTE — Assessment & Plan Note (Signed)
Very TTP, so unlikely to be of concern.  Pt is due for mammogram anyway - will schedule.  F/u in 2 weeks if not scheduled for mammogram

## 2010-11-23 NOTE — Progress Notes (Signed)
  Subjective:    Patient ID: April Castillo, female    DOB: 1955/07/14, 55 y.o.   MRN: [id number]  HPI 21 you here for CPE.  Has pap done at gyn.  Would like breast exam today. Doing well except had R hip fracture January 24, 2010 when fell off horse.  No other injury, in fact, helped her neck.  Had surgical repair with 3 screws,  Doing wel except some limited range of motion, saw PT already with relief. She is riding horses again without problems.  O/w no concerns. Mood is fine on fluoxetine. Denies hematuria or other urinary c/o.  No symptoms of kidney stone.  Had urine checked at gyn.    Review of Systems see HPI     Objective:   Physical Exam  Constitutional: She appears well-developed and well-nourished. No distress.  HENT:  Head: Normocephalic and atraumatic.  Right Ear: External ear normal.  Left Ear: External ear normal.  Nose: Nose normal.  Mouth/Throat: Oropharynx is clear and moist. No oropharyngeal exudate.  Eyes: Conjunctivae and EOM are normal. Pupils are equal, round, and reactive to light. Right eye exhibits no discharge. Left eye exhibits no discharge. No scleral icterus.  Neck: Normal range of motion. Neck supple. No tracheal deviation present. No thyromegaly present.  Cardiovascular: Normal rate, regular rhythm and normal heart sounds.  Exam reveals no gallop and no friction rub.   No murmur heard. Pulmonary/Chest: Effort normal and breath sounds normal. No stridor. No respiratory distress. She has no wheezes. She has no rales.       Breast exam: No axillary LAD.  NO nipple discharge.  R breast with tender, mobile smooth nodule 1.5 cm x 0.5 cm. Proximal to nipple at 4 o'clock postion.  L breast without mass.   Abdominal: Soft. She exhibits no distension.  Musculoskeletal:       Well healed scar R hip.  Decreased external motion R hip.  O/w normal exam without erythema.   Lymphadenopathy:    She has no cervical adenopathy.  Skin: She is not diaphoretic.          Assessment & Plan:

## 2010-11-23 NOTE — Assessment & Plan Note (Signed)
Doing well on current dose of fluoxetine.  Refill meds.

## 2010-11-23 NOTE — Patient Instructions (Addendum)
Please let us know if you have trouble scheduling your colonoscopy.  I think [facility] does the easier prep. Your last mammogram was 12/02/09, so it is almost time for another one. Please come back for a lab appointment (fasting) and we will check your cholesterol, electrolytes, vitamin d levels. Please come back and see me in 1 year if your blood pressure stays under 140/90 at home.  Feel free to come back sooner if you need anything.

## 2010-11-27 ENCOUNTER — Other Ambulatory Visit: Payer: Managed Care, Other (non HMO)

## 2010-11-27 DIAGNOSIS — I10 Essential (primary) hypertension: Secondary | ICD-10-CM

## 2010-11-27 DIAGNOSIS — E785 Hyperlipidemia, unspecified: Secondary | ICD-10-CM

## 2010-11-27 DIAGNOSIS — S72009A Fracture of unspecified part of neck of unspecified femur, initial encounter for closed fracture: Secondary | ICD-10-CM

## 2010-11-27 LAB — LIPID PANEL
LDL Cholesterol: 149 mg/dL — ABNORMAL HIGH (ref 0–99)
Triglycerides: 109 mg/dL (ref <150)

## 2010-11-27 LAB — BASIC METABOLIC PANEL
BUN: 17 mg/dL (ref 6–23)
Chloride: 104 mEq/L (ref 96–112)
Creat: 0.72 mg/dL (ref 0.50–1.10)

## 2010-11-27 NOTE — Progress Notes (Signed)
BMP,FLP AND VIT D DONE TODAY April Castillo

## 2010-11-28 LAB — VITAMIN D 25 HYDROXY (VIT D DEFICIENCY, FRACTURES): Vit D, 25-Hydroxy: 43 ng/mL (ref 30–89)

## 2010-12-05 ENCOUNTER — Telehealth: Payer: Self-pay | Admitting: Family Medicine

## 2010-12-05 NOTE — Telephone Encounter (Signed)
Pt is there for mammogram and she told them that Dr Sw[place]and felt a nodule in breast.  They need orders for diagnostic test.  pls fax to [phone] - patient is there.

## 2010-12-18 ENCOUNTER — Encounter: Payer: Self-pay | Admitting: Family Medicine

## 2010-12-18 NOTE — Progress Notes (Signed)
Addended by: [place], Adrick Kestler T on: 12/18/2010 10:52 AM   Modules accepted: Orders

## 2011-03-02 ENCOUNTER — Telehealth: Payer: Self-pay | Admitting: Family Medicine

## 2011-03-02 NOTE — Telephone Encounter (Signed)
Pt faxed physical form here for her job, recently had a physical done by Dr. Sw[place]iland, needs this filled out asap and faxed back to the # on the form. Given to D. Loring for any clinical completion.

## 2011-03-05 ENCOUNTER — Encounter: Payer: Self-pay | Admitting: *Deleted

## 2011-03-05 NOTE — Telephone Encounter (Signed)
Forwarded to d. Loring.Loralee Pacas [place]

## 2011-03-05 NOTE — Telephone Encounter (Signed)
Made in error

## 2011-03-05 NOTE — Telephone Encounter (Signed)
Form completed and faxed out.

## 2011-09-10 ENCOUNTER — Ambulatory Visit (INDEPENDENT_AMBULATORY_CARE_PROVIDER_SITE_OTHER): Payer: Managed Care, Other (non HMO) | Admitting: Family Medicine

## 2011-09-10 ENCOUNTER — Encounter: Payer: Self-pay | Admitting: Family Medicine

## 2011-09-10 VITALS — BP 133/78 | HR 93 | Temp 98.6°F | Ht 66.0 in | Wt 156.0 lb

## 2011-09-10 DIAGNOSIS — L255 Unspecified contact dermatitis due to plants, except food: Secondary | ICD-10-CM

## 2011-09-10 DIAGNOSIS — L237 Allergic contact dermatitis due to plants, except food: Secondary | ICD-10-CM | POA: Insufficient documentation

## 2011-09-10 MED ORDER — TRIAMCINOLONE ACETONIDE 0.1 % EX LOTN: TOPICAL_LOTION | Freq: Two times a day (BID) | CUTANEOUS | Status: DC

## 2011-09-10 NOTE — Progress Notes (Signed)
  Subjective:    Patient ID: April Castillo, female    DOB: [birth year], 56 y.o.   MRN: [id number]  HPI  Ms. Nobile presents to clinic with poison ivy.  She says it started about a week ago after she was outside working in the yard.  It was initially just a few spots on fore arms, but she has scratched it and it was gotten worse, more spots on fore arms, a few on upper arms.  Denies tongue swelling, wheezing, difficulty breathing.   Review of Systems Pertinent items in HPI.    Objective:   Physical Exam BP 133/78  Pulse 93  Temp(Src) 98.6 F (37 C) (Oral)  Ht 5\' 6"  (1.676 m)  Wt 156 lb (70.761 kg)  BMI 25.18 kg/m2 General appearance: alert, cooperative and no distress Skin: 1.5x10 cm raised contact dermatitis on left fore arm, smaller 2x5 cm lesion on right fore arm.  There are excoriations and some small pustules surrounding the lesions.        Assessment & Plan:

## 2011-09-10 NOTE — Assessment & Plan Note (Signed)
Localized to arms, no signs of systemic reaction.  Will Rx triamcinolone ointment for treatment.  Patient given warning signs for supra-infection that she should return to, also instructed to return if not getting better with triamcinolone.

## 2011-09-10 NOTE — Patient Instructions (Signed)
Poison Ivy Poison ivy is a inflammation of the skin (contact dermatitis) caused by touching the allergens on the leaves of the ivy plant following previous exposure to the plant. The rash usually appears 48 hours after exposure. The rash is usually bumps (papules) or blisters (vesicles) in a linear pattern. Depending on your own sensitivity, the rash may simply cause redness and itching, or it may also progress to blisters which may break open. These must be well cared for to prevent secondary bacterial (germ) infection, followed by scarring. Keep any open areas dry, clean, dressed, and covered with an antibacterial ointment if needed. The eyes may also get puffy. The puffiness is worst in the morning and gets better as the day progresses. This dermatitis usually heals without scarring, within 2 to 3 weeks without treatment. HOME CARE INSTRUCTIONS  Thoroughly wash with soap and water as soon as you have been exposed to poison ivy. You have about one half hour to remove the plant resin before it will cause the rash. This washing will destroy the oil or antigen on the skin that is causing, or will cause, the rash. Be sure to wash under your fingernails as any plant resin there will continue to spread the rash. Do not rub skin vigorously when washing affected area. Poison ivy cannot spread if no oil from the plant remains on your body. A rash that has progressed to weeping sores will not spread the rash unless you have not washed thoroughly. It is also important to wash any clothes you have been wearing as these may carry active allergens. The rash will return if you wear the unwashed clothing, even several days later. Avoidance of the plant in the future is the best measure. Poison ivy plant can be recognized by the number of leaves. Generally, poison ivy has three leaves with flowering branches on a single stem. Diphenhydramine may be purchased over the counter and used as needed for itching. Do not drive with  this medication if it makes you drowsy.Ask your caregiver about medication for children. SEEK MEDICAL CARE IF:  Open sores develop.   Redness spreads beyond area of rash.   You notice purulent (pus-like) discharge.   You have increased pain.   Other signs of infection develop (such as fever).  Document Released: 04/06/2000 Document Revised: 03/29/2011 Document Reviewed: 02/23/2009 [employer] Patient Information 2012 [employer]. 

## 2011-12-12 ENCOUNTER — Ambulatory Visit (INDEPENDENT_AMBULATORY_CARE_PROVIDER_SITE_OTHER): Payer: Managed Care, Other (non HMO) | Admitting: Sports Medicine

## 2011-12-12 ENCOUNTER — Encounter: Payer: Self-pay | Admitting: Sports Medicine

## 2011-12-12 VITALS — BP 137/76 | HR 79 | Temp 98.9°F | Ht 66.0 in | Wt 156.5 lb

## 2011-12-12 DIAGNOSIS — Z0000 Encounter for general adult medical examination without abnormal findings: Secondary | ICD-10-CM

## 2011-12-12 DIAGNOSIS — F3289 Other specified depressive episodes: Secondary | ICD-10-CM

## 2011-12-12 DIAGNOSIS — I10 Essential (primary) hypertension: Secondary | ICD-10-CM

## 2011-12-12 DIAGNOSIS — F329 Major depressive disorder, single episode, unspecified: Secondary | ICD-10-CM

## 2011-12-12 DIAGNOSIS — R635 Abnormal weight gain: Secondary | ICD-10-CM

## 2011-12-12 DIAGNOSIS — J309 Allergic rhinitis, unspecified: Secondary | ICD-10-CM

## 2011-12-12 LAB — LIPID PANEL
LDL Cholesterol: 169 mg/dL — ABNORMAL HIGH (ref 0–99)
Total CHOL/HDL Ratio: 4.7 Ratio

## 2011-12-12 LAB — BASIC METABOLIC PANEL
BUN: 14 mg/dL (ref 6–23)
CO2: 27 mEq/L (ref 19–32)
Calcium: 9.7 mg/dL (ref 8.4–10.5)
Glucose, Bld: 85 mg/dL (ref 70–99)

## 2011-12-12 LAB — TSH: TSH: 1.127 u[IU]/mL (ref 0.350–4.500)

## 2011-12-12 MED ORDER — FLUOXETINE HCL 20 MG PO CAPS: 20.0000 mg | ORAL_CAPSULE | Freq: Every day | ORAL | Status: DC

## 2011-12-12 MED ORDER — FLUTICASONE PROPIONATE 50 MCG/ACT NA SUSP: 2.0000 | Freq: Every day | NASAL | Status: DC

## 2011-12-12 MED ORDER — LISINOPRIL 10 MG PO TABS: 10.0000 mg | ORAL_TABLET | Freq: Every day | ORAL | Status: DC

## 2011-12-12 NOTE — Patient Instructions (Addendum)
It was nice to meet you today.  We are checking labs.  I have refilled your Prozac and Lisinopril  I have started you on Flonase to use daily for your allergies.  Please follow up with a Gastroenterologist for a repeat Colonoscopy.  Call your insurance to help you find the most affordable option.  We will see you in 1 year.  Allergic Rhinitis Allergic rhinitis is when the mucous membranes in the nose respond to allergens. Allergens are particles in the air that cause your body to have an allergic reaction. This causes you to release allergic antibodies. Through a chain of events, these eventually cause you to release histamine into the blood stream (hence the use of antihistamines). Although meant to be protective to the body, it is this release that causes your discomfort, such as frequent sneezing, congestion and an itchy runny nose.  CAUSES  The pollen allergens may come from grasses, trees, and weeds. This is seasonal allergic rhinitis, or "hay fever." Other allergens cause year-round allergic rhinitis (perennial allergic rhinitis) such as house dust mite allergen, pet dander and mold spores.  SYMPTOMS   Nasal stuffiness (congestion).   Runny, itchy nose with sneezing and tearing of the eyes.   There is often an itching of the mouth, eyes and ears.  It cannot be cured, but it can be controlled with medications. DIAGNOSIS  If you are unable to determine the offending allergen, skin or blood testing may find it. TREATMENT   Avoid the allergen.   Medications and allergy shots (immunotherapy) can help.   Hay fever may often be treated with antihistamines in pill or nasal spray forms. Antihistamines block the effects of histamine. There are over-the-counter medicines that may help with nasal congestion and swelling around the eyes. Check with your caregiver before taking or giving this medicine.  If the treatment above does not work, there are many new medications your caregiver can  prescribe. Stronger medications may be used if initial measures are ineffective. Desensitizing injections can be used if medications and avoidance fails. Desensitization is when a patient is given ongoing shots until the body becomes less sensitive to the allergen. Make sure you follow up with your caregiver if problems continue. SEEK MEDICAL CARE IF:   You develop fever (more than 100.5 F (38.1 C).   You develop a cough that does not stop easily (persistent).   You have shortness of breath.   You start wheezing.   Symptoms interfere with normal daily activities.  Document Released: 01/02/2001 Document Revised: 03/29/2011 Document Reviewed: 07/14/2008 [facility] Patient Information 2012 [place], [place].

## 2011-12-20 NOTE — Progress Notes (Signed)
  [facility]  Patient name: April Castillo MRN [id number]  Date of birth: 09/22/1955  CC & HPI:  April Castillo is a 56 y.o. female presenting today for complete physical examination  HYPERTENSION: chronic well controlled. taking medications as instructed, no medication side effects noted, no TIA's, no chest pain on exertion, no dyspnea on exertion and no swelling of ankles.   Depression: doing well on Prozac.  No acute concerns  Allergies.  Reports nasal congestion and pressures.  Rhinorrhea   ROS:  No chest pain, dyspnea, orthopnea or claudication. No fevers, no chills. No melena, no hematachezia  Pertinent History Reviewed:  Medical & Surgical Hx:  Reviewed: Significant for recent hip Fx s/p ORIF, chronic HTN, tension headaches, postmenopausal.  Followed at GYN for mammogram and PAP.  Has not had a colonoscopy. Medications: Reviewed & Updated - see associated section Social History: Reviewed - Significant for non-smoker  Objective Findings:  Vitals:  Filed Vitals:   12/12/11 0905  BP: 137/76  Pulse: 79  Temp: 98.9 F (37.2 C)    PE: GENERAL:  adult female. In no discomfort; no respiratory distress. PSYCH: Alert and appropriately interactive; Insight:Good   H&N: AT/[place], trachea midline EENT:  MMM, no scleral icterus, EOMi HEART: RRR, S1/S2 heard, no murmur LUNGS: CTA B, no wheezes, no crackles Abdomen:  EXTREMITIES: Moves all 4 extremities spontaneously, warm well perfused, no edema, bilateral DP and PT pulses 2/4.   PELVIC: deferred  Assessment & Plan:

## 2011-12-20 NOTE — Assessment & Plan Note (Signed)
Start flonase

## 2011-12-20 NOTE — Assessment & Plan Note (Signed)
Check TSH 

## 2011-12-20 NOTE — Assessment & Plan Note (Signed)
Stable/Well Controlled - no changes at this time. Check bmet and lipids

## 2011-12-20 NOTE — Assessment & Plan Note (Signed)
Pt reports she will discuss with her insurance provider screening colonoscopy. Up to date on mammogram and PAP.  Fasting lab work in 1-2 weeks

## 2011-12-20 NOTE — Assessment & Plan Note (Addendum)
Fill prozac Will check TSH

## 2011-12-21 ENCOUNTER — Encounter: Payer: Self-pay | Admitting: Sports Medicine

## 2012-01-09 ENCOUNTER — Encounter: Payer: Self-pay | Admitting: Gastroenterology

## 2013-01-01 ENCOUNTER — Ambulatory Visit (INDEPENDENT_AMBULATORY_CARE_PROVIDER_SITE_OTHER): Payer: Managed Care, Other (non HMO) | Admitting: Sports Medicine

## 2013-01-01 ENCOUNTER — Encounter: Payer: Self-pay | Admitting: Sports Medicine

## 2013-01-01 VITALS — BP 126/69 | HR 79 | Temp 99.2°F | Ht 66.0 in | Wt 156.0 lb

## 2013-01-01 DIAGNOSIS — Z23 Encounter for immunization: Secondary | ICD-10-CM

## 2013-01-01 DIAGNOSIS — J309 Allergic rhinitis, unspecified: Secondary | ICD-10-CM

## 2013-01-01 DIAGNOSIS — G44209 Tension-type headache, unspecified, not intractable: Secondary | ICD-10-CM

## 2013-01-01 DIAGNOSIS — Z0000 Encounter for general adult medical examination without abnormal findings: Secondary | ICD-10-CM

## 2013-01-01 DIAGNOSIS — I10 Essential (primary) hypertension: Secondary | ICD-10-CM

## 2013-01-01 DIAGNOSIS — M25562 Pain in left knee: Secondary | ICD-10-CM

## 2013-01-01 DIAGNOSIS — L719 Rosacea, unspecified: Secondary | ICD-10-CM

## 2013-01-01 DIAGNOSIS — D259 Leiomyoma of uterus, unspecified: Secondary | ICD-10-CM

## 2013-01-01 DIAGNOSIS — N959 Unspecified menopausal and perimenopausal disorder: Secondary | ICD-10-CM

## 2013-01-01 DIAGNOSIS — M25569 Pain in unspecified knee: Secondary | ICD-10-CM

## 2013-01-01 DIAGNOSIS — E785 Hyperlipidemia, unspecified: Secondary | ICD-10-CM | POA: Insufficient documentation

## 2013-01-01 HISTORY — DX: Hyperlipidemia, unspecified: E78.5

## 2013-01-01 LAB — COMPREHENSIVE METABOLIC PANEL
ALT: 15 U/L (ref 0–35)
Alkaline Phosphatase: 76 U/L (ref 39–117)
CO2: 28 mEq/L (ref 19–32)
Chloride: 107 mEq/L (ref 96–112)
Potassium: 4.2 mEq/L (ref 3.5–5.3)
Total Bilirubin: 0.8 mg/dL (ref 0.3–1.2)
Total Protein: 6.8 g/dL (ref 6.0–8.3)

## 2013-01-01 LAB — LIPID PANEL
Cholesterol: 239 mg/dL — ABNORMAL HIGH (ref 0–200)
Total CHOL/HDL Ratio: 4.7 Ratio
Triglycerides: 100 mg/dL (ref <150)
VLDL: 20 mg/dL (ref 0–40)

## 2013-01-01 MED ORDER — FLUOXETINE HCL 20 MG PO CAPS: 20.0000 mg | ORAL_CAPSULE | Freq: Every day | ORAL | Status: DC

## 2013-01-01 MED ORDER — LISINOPRIL 10 MG PO TABS: 10.0000 mg | ORAL_TABLET | Freq: Every day | ORAL | Status: DC

## 2013-01-01 MED ORDER — FLUTICASONE PROPIONATE 50 MCG/ACT NA SUSP: 2.0000 | Freq: Every day | NASAL | Status: DC

## 2013-01-01 NOTE — Assessment & Plan Note (Signed)
Stable/Well Controlled - no changes at this time. 

## 2013-01-01 NOTE — Patient Instructions (Addendum)
It was nice to see you today, thanks for coming in!  1. HYPERTENSION Keep taking your lisinopril  2. Health care maintenance I would like to get records from [facility] regarding your colonoscopy  3. Other and unspecified hyperlipidemia We are checking your cholesterol & would like to start a medication if it is still elevated  4. Allergic rhinitis Restart your: - fluticasone (FLONASE) 50 MCG/ACT nasal spray; Place 2 sprays into the nose daily.  Dispense: 16 g; Refill: 6    Please plan to return to see me in 6 months if  We'll start a new to for your cholesterol; otherwise please return in one year..    If you need anything prior to your next visit please call the clinic. Please Bring all medications or accurate medication list with you to each appointment; an accurate medication list is essential in providing you the best care possible.

## 2013-01-01 NOTE — Progress Notes (Signed)
  [facility]  KHADEEJA ELDEN - 57 y.o. female MRN [id number]  Date of birth: 01-29-1956  HPI & ROS  April Castillo presents today for Annual exam.  Reports doing well. Some mild hearing changes in the past 6 weeks.  No fevers, chills, cough, congestion, rhinorrhea.  No taking allergy medication.  Reports mood is stable given hx of depression.  No Side effects from prozac.  HTN: The patient is taking hypertensive medications compliantly without side effects.  Denies chest pain, dyspnea, edema, or TIA's.  HLD: was elevated previously.  Not been on statin.  No prior MI/CVA/TIA.  No stroke like symptoms  Care Coordination & Pertinent History  See above for pertinent hx.  No prior MI/CVA.  Otherwise please see associated EMR sections for complete problem List, past medical history, past surgical history, family history and social history. Medications   Prior to Admission medications   Medication Sig Start Date End Date Taking? Authorizing Provider  FLUoxetine (PROZAC) 20 MG capsule Take 1 capsule (20 mg total) by mouth daily. 01/01/13   Andrena Mews, DO  fluticasone (FLONASE) 50 MCG/ACT nasal spray Place 2 sprays into the nose daily. 01/01/13 01/01/14  Andrena Mews, DO  ibuprofen (ADVIL,MOTRIN) 800 MG tablet Take 800 mg by mouth every 8 (eight) hours as needed. for low back pain    Historical Provider, MD  lisinopril (PRINIVIL,ZESTRIL) 10 MG tablet Take 1 tablet (10 mg total) by mouth daily. 01/01/13   Andrena Mews, DO    Objective Findings:  Vitals: BP 126/69  Pulse 79  Temp(Src) 99.2 F (37.3 C) (Oral)  Ht 5\' 6"  (1.676 m)  Wt 156 lb (70.761 kg)  BMI 25.19 kg/m2 PE: GENERAL:   Adult caucasian  female.  In no discomfort; no respiratory distress.   PSYCH:   Alert and appropriate.  Good insight; slightly flattened affect but pleasant and interactive HNEENT:   B middle ear effusions without erythema, no air fluid level.  No TM erythema or pus CARDIO:   RRR, S1/S2  heard, no murmur LUNGS:   CTA B, no wheezes, no crackles ABDOMEN:   +BS soft, nontender EXTREMITIES:   No edema, warm well perfused, no lesions.  No lateralizations GU:    SKIN:    NEUROMSK:    Assessment & Plan:  See problem associated charting

## 2013-01-01 NOTE — Assessment & Plan Note (Signed)
Recheck FLP today; CMET for liver enzymes Start statin if indicated after new cholesterol meds

## 2013-01-01 NOTE — Assessment & Plan Note (Signed)
Uptodate on Colonoscopy Upcoming appointment with Dr. Arlyce Dice for PAP smear Flu shot today Tetanus <5 years

## 2013-01-01 NOTE — Assessment & Plan Note (Signed)
?   Hearing loss associated with middle ear effusion Restart Flonase.

## 2013-01-02 ENCOUNTER — Encounter: Payer: Self-pay | Admitting: Sports Medicine

## 2013-01-16 ENCOUNTER — Encounter: Payer: Self-pay | Admitting: Sports Medicine

## 2013-08-03 HISTORY — PX: CATARACT EXTRACTION W/ INTRAOCULAR LENS IMPLANT: SHX1309

## 2013-08-10 ENCOUNTER — Encounter: Payer: Self-pay | Admitting: Sports Medicine

## 2013-10-28 ENCOUNTER — Telehealth: Payer: Self-pay | Admitting: Family Medicine

## 2013-10-28 NOTE — Telephone Encounter (Signed)
Refill request for Lisinopril & Prozac. Patient is not due for her yearly physical until 12/2013 and will make an appt then. Please refill 90 supply.

## 2013-11-02 MED ORDER — LISINOPRIL 10 MG PO TABS: 10.0000 mg | ORAL_TABLET | Freq: Every day | ORAL | Status: DC

## 2013-11-02 MED ORDER — FLUOXETINE HCL 20 MG PO CAPS: 20.0000 mg | ORAL_CAPSULE | Freq: Every day | ORAL | Status: DC

## 2013-11-02 NOTE — Telephone Encounter (Signed)
Refilled lisinopril and prozac.

## 2013-11-02 NOTE — Telephone Encounter (Signed)
LVM for patient to call back. ?

## 2013-12-16 ENCOUNTER — Ambulatory Visit (INDEPENDENT_AMBULATORY_CARE_PROVIDER_SITE_OTHER): Payer: Managed Care, Other (non HMO) | Admitting: Family Medicine

## 2013-12-16 ENCOUNTER — Encounter: Payer: Self-pay | Admitting: Family Medicine

## 2013-12-16 VITALS — BP 124/68 | HR 74 | Temp 97.1°F | Ht 66.0 in | Wt 157.4 lb

## 2013-12-16 DIAGNOSIS — Z0000 Encounter for general adult medical examination without abnormal findings: Secondary | ICD-10-CM

## 2013-12-16 DIAGNOSIS — I10 Essential (primary) hypertension: Secondary | ICD-10-CM

## 2013-12-16 LAB — CBC WITH DIFFERENTIAL/PLATELET
Basophils Absolute: 0 10*3/uL (ref 0.0–0.1)
Basophils Relative: 0 % (ref 0–1)
Eosinophils Absolute: 0.1 10*3/uL (ref 0.0–0.7)
Eosinophils Relative: 2 % (ref 0–5)
HCT: 36.4 % (ref 36.0–46.0)
Hemoglobin: 12.1 g/dL (ref 12.0–15.0)
LYMPHS ABS: 1.2 10*3/uL (ref 0.7–4.0)
LYMPHS PCT: 28 % (ref 12–46)
MCH: 30.5 pg (ref 26.0–34.0)
MCHC: 33.2 g/dL (ref 30.0–36.0)
MCV: 91.7 fL (ref 78.0–100.0)
Monocytes Absolute: 0.3 10*3/uL (ref 0.1–1.0)
Monocytes Relative: 7 % (ref 3–12)
NEUTROS ABS: 2.8 10*3/uL (ref 1.7–7.7)
NEUTROS PCT: 63 % (ref 43–77)
Platelets: 289 10*3/uL (ref 150–400)
RBC: 3.97 MIL/uL (ref 3.87–5.11)
RDW: 13.3 % (ref 11.5–15.5)
WBC: 4.4 10*3/uL (ref 4.0–10.5)

## 2013-12-16 LAB — COMPREHENSIVE METABOLIC PANEL
ALBUMIN: 4.6 g/dL (ref 3.5–5.2)
ALK PHOS: 67 U/L (ref 39–117)
ALT: 13 U/L (ref 0–35)
AST: 14 U/L (ref 0–37)
BILIRUBIN TOTAL: 1 mg/dL (ref 0.2–1.2)
BUN: 21 mg/dL (ref 6–23)
CO2: 29 mEq/L (ref 19–32)
Calcium: 9.5 mg/dL (ref 8.4–10.5)
Chloride: 105 mEq/L (ref 96–112)
Creat: 0.71 mg/dL (ref 0.50–1.10)
GLUCOSE: 94 mg/dL (ref 70–99)
Potassium: 4 mEq/L (ref 3.5–5.3)
Sodium: 141 mEq/L (ref 135–145)
Total Protein: 7 g/dL (ref 6.0–8.3)

## 2013-12-16 LAB — LIPID PANEL
CHOL/HDL RATIO: 4.1 ratio
CHOLESTEROL: 216 mg/dL — AB (ref 0–200)
HDL: 53 mg/dL (ref >39)
LDL Cholesterol: 144 mg/dL — ABNORMAL HIGH (ref 0–99)
Triglycerides: 93 mg/dL (ref <150)
VLDL: 19 mg/dL (ref 0–40)

## 2013-12-16 LAB — POCT GLYCOSYLATED HEMOGLOBIN (HGB A1C): Hemoglobin A1C: 5.7

## 2013-12-16 MED ORDER — LISINOPRIL 10 MG PO TABS: 10.0000 mg | ORAL_TABLET | Freq: Every day | ORAL | Status: DC

## 2013-12-16 MED ORDER — FLUOXETINE HCL 20 MG PO CAPS: 20.0000 mg | ORAL_CAPSULE | Freq: Every day | ORAL | Status: DC

## 2013-12-16 NOTE — Progress Notes (Signed)
   Subjective:    Patient ID: April Castillo, female    DOB: [birth year], 58 y.o.   MRN: [id number]  HPI  Patient presents for yearly physical  Concerns: None  Past Medical History  Diagnosis Date  . Depression   . Heart murmur   . Hypertension   . Kidney stone   . Other and unspecified hyperlipidemia 01/01/2013  . Tension headache 06/20/2006        . Rosacea 06/20/2006  . PERIMENOPAUSAL SYNDROME 11/07/2009  . KNEE PAIN, LEFT 11/07/2009  . Hip fracture, right 11/23/2010  . UTERINE FIBROID 06/20/2006   Past Surgical History  Procedure Laterality Date  . R hip fracture and repair  January 24, 2010  . Cataract extraction w/ intraocular lens implant Left 08/03/13    Dr. Katy Fitch @ [facility]  . Eye surgery Right     Cataract   Family History  Problem Relation Age of Onset  . Cancer Father   . Hypertension Father   . Cancer Sister   . Thyroid disease Sister   . Breast cancer Sister   . Cancer Maternal Uncle   . Diabetes Neg Hx   . Heart disease Neg Hx    History  Substance Use Topics  . Smoking status: Never Smoker   . Smokeless tobacco: Never Used  . Alcohol Use: 1.8 oz/week    2 Cans of beer, 1 Glasses of wine per week     Comment: few drinks a week   Current Outpatient Prescriptions on File Prior to Visit  Medication Sig Dispense Refill  . fluticasone (FLONASE) 50 MCG/ACT nasal spray Place 2 sprays into the nose daily.  16 g  6  . ibuprofen (ADVIL,MOTRIN) 800 MG tablet Take 800 mg by mouth every 8 (eight) hours as needed. for low back pain       No current facility-administered medications on file prior to visit.   No Known Allergies  Review of Systems  All other systems reviewed and are negative.      Objective:   Physical Exam  Constitutional: She is oriented to person, place, and time. She appears well-developed and well-nourished.  Eyes: Conjunctivae and EOM are normal. Pupils are equal, round, and reactive to light.  Neck: Normal range of motion. Neck supple.  No thyromegaly present.  Cardiovascular: Normal rate, regular rhythm and normal heart sounds.   Pulmonary/Chest: Effort normal and breath sounds normal. No respiratory distress. She has no wheezes.  Abdominal: Soft. Bowel sounds are normal. She exhibits no distension. There is no tenderness.  Musculoskeletal: Normal range of motion. She exhibits no edema and no tenderness.  Neurological: She is alert and oriented to person, place, and time. She has normal reflexes. No cranial nerve deficit.  Skin: Skin is warm and dry.        Assessment & Plan:   58yo here for routine well adult visit  - see OB/GYN for pap smears - CBC, Cmet, Lipid panel and A1C - Follow-up in 3 months - Refill medication (Lisinopril and Prozac)

## 2013-12-16 NOTE — Patient Instructions (Addendum)
Thank you for coming to see me today. It was a pleasure. Today we talked about your overall health. We made some goals: Lose 10 lbs by exercising (biking 3 times per week) and healthier eating (eating more fruits and vegetables and regular meals) in 6 months. I have gotten some lab work as well. You will be called with the results.  Please make an appointment to see me in 3 months for a follow-up.  If you have any questions or concerns, please do not hesitate to call the office at [phone].  Sincerely,  Cordelia Poche, MD

## 2013-12-21 ENCOUNTER — Telehealth: Payer: Self-pay | Admitting: *Deleted

## 2013-12-21 ENCOUNTER — Encounter: Payer: Self-pay | Admitting: Family Medicine

## 2013-12-21 NOTE — Telephone Encounter (Signed)
Message copied by Corinna Capra on Mon Dec 21, 2013  8:57 AM ------      Message from: April Castillo A      Created: Mon Dec 21, 2013 12:03 AM       Patient has high cholesterol. Otherwise labs are within normal limits. Will sent some information via mail to patient regarding cholesterol. Will hopefully Prevent starting new medication if patient makes some lifestyle changes. ------

## 2014-03-26 ENCOUNTER — Ambulatory Visit (INDEPENDENT_AMBULATORY_CARE_PROVIDER_SITE_OTHER): Payer: Managed Care, Other (non HMO) | Admitting: Family Medicine

## 2014-03-26 ENCOUNTER — Encounter: Payer: Self-pay | Admitting: Family Medicine

## 2014-03-26 VITALS — BP 155/77 | HR 78 | Temp 98.5°F | Ht 66.0 in | Wt 163.1 lb

## 2014-03-26 DIAGNOSIS — R42 Dizziness and giddiness: Secondary | ICD-10-CM

## 2014-03-26 NOTE — Patient Instructions (Signed)
  The most likely cause for your lightheadedness is from your sinus pressure changes. Please use the flonase to help with this (2 sprays each nostril once daily) as well as nasal saline If no improvement in about one week or change in symptoms, please call.  Thanks, Dr. Awanda Mink

## 2014-03-26 NOTE — Progress Notes (Signed)
  Subjective:     April Castillo is a 58 y.o. female who presents for evaluation of lightheadedness. The symptoms started 2 weeks ago and are unchanged. The attacks occur daily and last intermittent periods from several minutes to about one hr.  She does have hx of sinus congestion but has not noticed anything with her sinuses at this point. Associated CNS symptoms: none and including dizziness, blurred vision, tinnitus, change in positioning worsening condition, CP/SOB, N/V/D, hearing loss, weakness, ataxia. Recent infections: none. Head trauma: denied. Drug ingestion: none. Noise exposure: no occupational exposure. Family history: non-contributory.  The following portions of the patient's history were reviewed and updated as appropriate: allergies, current medications, past family history, past medical history, past social history, past surgical history and problem list.  Review of Systems Pertinent items are noted in HPI.    Objective:    BP 155/77 mmHg  Pulse 78  Temp(Src) 98.5 F (36.9 C) (Oral)  Ht 5\' 6"  (1.676 m)  Wt 163 lb 1.6 oz (73.982 kg)  BMI 26.34 kg/m2 General appearance: alert, AAO x 3 Head: Normocephalic, without obvious abnormality, atraumatic Eyes: conjunctivae/corneas clear. PERRL, EOM's intact. Fundi benign. Ears: normal TM's and external ear canals both ears Nose: turbinates red, no sinus tenderness Throat: lips, mucosa, and tongue normal; teeth and gums normal Neck: no adenopathy Lungs: clear to auscultation bilaterally Heart: regular rate and rhythm, S1, S2 normal, no murmur, click, rub or gallop      Assessment:    Lightheaded    Plan:    Most likely related to sinus pressure changes as no associated Sx and no concerning findings on history or exam   Sinus congestion and erythema on exam today.  Recommend Flonase 2 sprays each nostril qd, mucinex PRN, and nasal saline. F/U if no improvement over the next week or any changing Sx.

## 2014-04-02 ENCOUNTER — Ambulatory Visit (INDEPENDENT_AMBULATORY_CARE_PROVIDER_SITE_OTHER): Payer: Managed Care, Other (non HMO) | Admitting: Family Medicine

## 2014-04-02 ENCOUNTER — Encounter: Payer: Self-pay | Admitting: Family Medicine

## 2014-04-02 VITALS — BP 142/84 | HR 92 | Temp 98.9°F | Wt 157.0 lb

## 2014-04-02 DIAGNOSIS — R197 Diarrhea, unspecified: Secondary | ICD-10-CM

## 2014-04-02 NOTE — Patient Instructions (Signed)
It was nice seeing you today, however am sorry you don't feel okay. Your diarrhea could be due to viral infection which should improve over the next few days. Other wise please return next week for stool testing. Please hydrate yourself and eat regular meal. Call if you have any questions or if your symptom is not getting better.  Diarrhea Diarrhea is watery poop (stool). It can make you feel weak, tired, thirsty, or give you a dry mouth (signs of dehydration). Watery poop is a sign of another problem, most often an infection. It often lasts 2-3 days. It can last longer if it is a sign of something serious. Take care of yourself as told by your doctor. HOME CARE   Drink 1 cup (8 ounces) of fluid each time you have watery poop.  Do not drink the following fluids:  Those that contain simple sugars (fructose, glucose, galactose, lactose, sucrose, maltose).  Sports drinks.  Fruit juices.  Whole milk products.  Sodas.  Drinks with caffeine (coffee, tea, soda) or alcohol.  Oral rehydration solution may be used if the doctor says it is okay. You may make your own solution. Follow this recipe:   - teaspoon table salt.   teaspoon baking soda.   teaspoon salt substitute containing potassium chloride.  1 tablespoons sugar.  1 liter (34 ounces) of water.  Avoid the following foods:  High fiber foods, such as raw fruits and vegetables.  Nuts, seeds, and whole grain breads and cereals.   Those that are sweetened with sugar alcohols (xylitol, sorbitol, mannitol).  Try eating the following foods:  Starchy foods, such as rice, toast, pasta, low-sugar cereal, oatmeal, baked potatoes, crackers, and bagels.  Bananas.  Applesauce.  Eat probiotic-rich foods, such as yogurt and milk products that are fermented.  Wash your hands well after each time you have watery poop.  Only take medicine as told by your doctor.  Take a warm bath to help lessen burning or pain from having watery  poop. GET HELP RIGHT AWAY IF:   You cannot drink fluids without throwing up (vomiting).  You keep throwing up.  You have blood in your poop, or your poop looks black and tarry.  You do not pee (urinate) in 6-8 hours, or there is only a small amount of very dark pee.  You have belly (abdominal) pain that gets worse or stays in the same spot (localizes).  You are weak, dizzy, confused, or light-headed.  You have a very bad headache.  Your watery poop gets worse or does not get better.  You have a fever or lasting symptoms for more than 2-3 days.  You have a fever and your symptoms suddenly get worse. MAKE SURE YOU:   Understand these instructions.  Will watch your condition.  Will get help right away if you are not doing well or get worse. Document Released: 09/26/2007 Document Revised: 08/24/2013 Document Reviewed: 12/16/2011 [facility] Patient Information 2015 [place], [place]. This information is not intended to replace advice given to you by your health care provider. Make sure you discuss any questions you have with your health care provider.

## 2014-04-02 NOTE — Progress Notes (Signed)
Subjective:     Patient ID: April Castillo, female   DOB: 01/31/1956, 58 y.o.   MRN: [id number]  Diarrhea  This is a new problem. The current episode started in the past 7 days (Started 3 days ago). The problem occurs 5 to 10 times per day. The problem has been unchanged. The stool consistency is described as watery (No blood or mucus). The patient states that diarrhea awakens her from sleep. Associated symptoms include weight loss. Pertinent negatives include no abdominal pain, bloating, chills, coughing, fever, URI or vomiting. Associated symptoms comments: Lost 5 lbs since Tuesday. Appetite is low.. Exacerbated by: Eating. She has tried nothing for the symptoms.  Denies sick contact, no change in diet or eating outside, denies recent travel. She does have a cat who is not sick. She practice good hand hygiene after cleaning cat's litter box.  Current Outpatient Prescriptions on File Prior to Visit  Medication Sig Dispense Refill  . FLUoxetine (PROZAC) 20 MG capsule Take 1 capsule (20 mg total) by mouth daily. 90 capsule 3  . lisinopril (PRINIVIL,ZESTRIL) 10 MG tablet Take 1 tablet (10 mg total) by mouth daily. 90 tablet 3  . fluticasone (FLONASE) 50 MCG/ACT nasal spray Place 2 sprays into the nose daily. 16 g 6  . ibuprofen (ADVIL,MOTRIN) 800 MG tablet Take 800 mg by mouth every 8 (eight) hours as needed. for low back pain     No current facility-administered medications on file prior to visit.   Past Medical History  Diagnosis Date  . Depression   . Heart murmur   . Hypertension   . Kidney stone   . Other and unspecified hyperlipidemia 01/01/2013  . Tension headache 06/20/2006        . Rosacea 06/20/2006  . PERIMENOPAUSAL SYNDROME 11/07/2009  . KNEE PAIN, LEFT 11/07/2009  . Hip fracture, right 11/23/2010  . UTERINE FIBROID 06/20/2006      Review of Systems  Constitutional: Positive for weight loss. Negative for fever and chills.  Respiratory: Negative.  Negative for cough.    Cardiovascular: Negative.   Gastrointestinal: Positive for diarrhea. Negative for vomiting, abdominal pain and bloating.  Genitourinary: Negative.   Skin: Negative for rash.   Filed Vitals:   04/02/14 0957  BP: 142/84  Pulse: 92  Temp: 98.9 F (37.2 C)  TempSrc: Oral  Weight: 157 lb (71.215 kg)       Objective:   Physical Exam  Constitutional: She appears well-developed. No distress.  Cardiovascular: Normal rate, regular rhythm, normal heart sounds and intact distal pulses.   No murmur heard. Pulmonary/Chest: Effort normal and breath sounds normal. No respiratory distress. She has no wheezes.  Abdominal: Soft. Bowel sounds are normal. She exhibits no distension and no mass. There is no tenderness. There is no rebound and no guarding.  Nursing note and vitals reviewed.      Assessment:     Diarrhea     Plan:     Check problem list.      Total time for this face to face encounter and coordination of care was more than 15 minute.

## 2014-04-02 NOTE — Assessment & Plan Note (Signed)
Likely viral illness. Patient does not appear overtly dehydrated. I encouraged oral hydration and regular diet as tolerated. Patient reassured this should resolve soon otherwise to return next week for stool testing if no improvement. Hand hygiene also discussed. Follow up as needed. She agreed with plan.

## 2015-02-02 ENCOUNTER — Encounter: Payer: Self-pay | Admitting: Family Medicine

## 2015-02-02 ENCOUNTER — Ambulatory Visit (INDEPENDENT_AMBULATORY_CARE_PROVIDER_SITE_OTHER): Payer: BLUE CROSS/BLUE SHIELD | Admitting: Family Medicine

## 2015-02-02 ENCOUNTER — Other Ambulatory Visit: Payer: Self-pay | Admitting: Family Medicine

## 2015-02-02 VITALS — BP 126/64 | HR 69 | Temp 98.7°F | Ht 66.0 in | Wt 161.0 lb

## 2015-02-02 DIAGNOSIS — Z0000 Encounter for general adult medical examination without abnormal findings: Secondary | ICD-10-CM | POA: Diagnosis not present

## 2015-02-02 DIAGNOSIS — Z1159 Encounter for screening for other viral diseases: Secondary | ICD-10-CM | POA: Diagnosis not present

## 2015-02-02 DIAGNOSIS — I10 Essential (primary) hypertension: Secondary | ICD-10-CM | POA: Diagnosis not present

## 2015-02-02 DIAGNOSIS — F32A Depression, unspecified: Secondary | ICD-10-CM

## 2015-02-02 DIAGNOSIS — Z23 Encounter for immunization: Secondary | ICD-10-CM

## 2015-02-02 DIAGNOSIS — F329 Major depressive disorder, single episode, unspecified: Secondary | ICD-10-CM

## 2015-02-02 LAB — CBC WITH DIFFERENTIAL/PLATELET
BASOS PCT: 0 % (ref 0–1)
Basophils Absolute: 0 10*3/uL (ref 0.0–0.1)
EOS PCT: 1 % (ref 0–5)
Eosinophils Absolute: 0.1 10*3/uL (ref 0.0–0.7)
HCT: 36.7 % (ref 36.0–46.0)
Hemoglobin: 12.6 g/dL (ref 12.0–15.0)
LYMPHS PCT: 31 % (ref 12–46)
Lymphs Abs: 1.8 10*3/uL (ref 0.7–4.0)
MCH: 31.9 pg (ref 26.0–34.0)
MCHC: 34.3 g/dL (ref 30.0–36.0)
MCV: 92.9 fL (ref 78.0–100.0)
MPV: 9.5 fL (ref 8.6–12.4)
Monocytes Absolute: 0.4 10*3/uL (ref 0.1–1.0)
Monocytes Relative: 7 % (ref 3–12)
Neutro Abs: 3.5 10*3/uL (ref 1.7–7.7)
Neutrophils Relative %: 61 % (ref 43–77)
Platelets: 308 10*3/uL (ref 150–400)
RBC: 3.95 MIL/uL (ref 3.87–5.11)
RDW: 13.7 % (ref 11.5–15.5)
WBC: 5.7 10*3/uL (ref 4.0–10.5)

## 2015-02-02 LAB — COMPLETE METABOLIC PANEL WITH GFR
ALT: 15 U/L (ref 6–29)
AST: 15 U/L (ref 10–35)
Albumin: 4.6 g/dL (ref 3.6–5.1)
Alkaline Phosphatase: 74 U/L (ref 33–130)
BILIRUBIN TOTAL: 0.8 mg/dL (ref 0.2–1.2)
BUN: 23 mg/dL (ref 7–25)
CALCIUM: 9.6 mg/dL (ref 8.6–10.4)
CHLORIDE: 103 mmol/L (ref 98–110)
CO2: 29 mmol/L (ref 20–31)
CREATININE: 0.76 mg/dL (ref 0.50–0.99)
GFR, Est African American: >89 mL/min (ref >=60)
GFR, Est Non African American: 86 mL/min (ref >=60)
Glucose, Bld: 85 mg/dL (ref 65–99)
Potassium: 4.4 mmol/L (ref 3.5–5.3)
SODIUM: 141 mmol/L (ref 135–146)
Total Protein: 7.1 g/dL (ref 6.1–8.1)

## 2015-02-02 LAB — LIPID PANEL
Cholesterol: 252 mg/dL — ABNORMAL HIGH (ref 125–200)
HDL: 58 mg/dL (ref >=46)
LDL CALC: 179 mg/dL — AB (ref <130)
Total CHOL/HDL Ratio: 4.3 Ratio (ref <=5.0)
Triglycerides: 75 mg/dL (ref <150)
VLDL: 15 mg/dL (ref <30)

## 2015-02-02 MED ORDER — FLUOXETINE HCL 20 MG PO CAPS: 20.0000 mg | ORAL_CAPSULE | Freq: Every day | ORAL | Status: DC

## 2015-02-02 MED ORDER — LISINOPRIL 10 MG PO TABS
10.0000 mg | ORAL_TABLET | Freq: Every day | ORAL | Status: DC
Start: 2015-02-02 — End: 2016-01-24

## 2015-02-02 MED ORDER — LISINOPRIL 10 MG PO TABS: 10.0000 mg | ORAL_TABLET | Freq: Every day | ORAL | Status: DC

## 2015-02-02 NOTE — Assessment & Plan Note (Signed)
Refill lisinopril 

## 2015-02-02 NOTE — Patient Instructions (Signed)
Thank you for coming to see me today. It was a pleasure. Today we talked about:   I am getting some lab work. You should get a letter in the mail.  We discussed a plan to lose some weight. This is included in the goal section. I will give you a card to see the nutritionist.  Please make an appointment to be seen in one year for follow-up  If you have any questions or concerns, please do not hesitate to call the office at [phone].  Sincerely,  Cordelia Poche, MD

## 2015-02-02 NOTE — Progress Notes (Signed)
Subjective    April Castillo is a 59 y.o. female that presents for yearly physical exam.   Concerns:  1. Weight gain  2. Hair thinning  Goals    . Weight < 145 lb (65.772 kg)     Exercise 80min per day for five days out of the week during lunch break.        Annual Gynecological Exam  No obstetric history on file. Wt Readings from Last 3 Encounters:  02/02/15 161 lb (73.029 kg)  04/02/14 157 lb (71.215 kg)  03/26/14 163 lb 1.6 oz (73.982 kg)   Last period:   Regular periods: No Heavy bleeding: No  Sexually active: Yes Birth control or hormonal therapy: N/A Hot flashes: No Vaginal discharge:  Dysuria: No   Last mammogram: 2016 Breast mass or concerns: No Last Pap: 2013  History of abnormal pap: No   Past Medical History  Diagnosis Date  . Depression   . Heart murmur   . Hypertension   . Kidney stone   . Other and unspecified hyperlipidemia 01/01/2013  . Tension headache 06/20/2006        . Rosacea 06/20/2006  . PERIMENOPAUSAL SYNDROME 11/07/2009  . KNEE PAIN, LEFT 11/07/2009  . Hip fracture, right 11/23/2010  . UTERINE FIBROID 06/20/2006    Past Surgical History  Procedure Laterality Date  . R hip fracture and repair  January 24, 2010  . Cataract extraction w/ intraocular lens implant Left 08/03/13    Dr. Katy Fitch @ [facility]  . Eye surgery Right     Cataract    Current Outpatient Prescriptions on File Prior to Visit  Medication Sig Dispense Refill  . FLUoxetine (PROZAC) 20 MG capsule Take 1 capsule (20 mg total) by mouth daily. 90 capsule 3  . fluticasone (FLONASE) 50 MCG/ACT nasal spray Place 2 sprays into the nose daily. 16 g 6  . ibuprofen (ADVIL,MOTRIN) 800 MG tablet Take 800 mg by mouth every 8 (eight) hours as needed. for low back pain    . lisinopril (PRINIVIL,ZESTRIL) 10 MG tablet Take 1 tablet (10 mg total) by mouth daily. 90 tablet 3   No current facility-administered medications on file prior to visit.    No Known Allergies  Social  History   Social History  . Marital Status: Single    Spouse Name: N/A  . Number of Children: N/A  . Years of Education: N/A   Occupational History  . travel agent    Social History Main Topics  . Smoking status: Never Smoker   . Smokeless tobacco: Never Used  . Alcohol Use: 1.8 oz/week    2 Cans of beer, 1 Glasses of wine per week     Comment: few drinks a week  . Drug Use: No  . Sexual Activity: Yes    Birth Control/ Protection: None   Other Topics Concern  . Not on file   Social History Narrative   In relationship with female partner who lives in [place].  Works as travel Music therapist, job has become less stressful.      Family History  Problem Relation Age of Onset  . Cancer Father   . Hypertension Father   . Cancer Sister   . Thyroid disease Sister   . Breast cancer Sister   . Cancer Maternal Uncle   . Diabetes Neg Hx   . Heart disease Neg Hx     ROS  Per HPI   Objective   BP 126/64 mmHg  Pulse 69  Temp(Src) 98.7 F (37.1 C) (Oral)  Ht 5\' 6"  (1.676 m)  Wt 161 lb (73.029 kg)  BMI 26.00 kg/m2  General: Well appearing, no distress HEENT: TMs normal with some ear wax, Nares patent, oropharynx clear and moist with no cervical adenopathy Respiratory/Chest: Clear to auscultation bilaterally Cardiovascular: Regular rate and rhythm Gastrointestinal: Soft, non-tender, non-distended Genitourinary: Not examined    Musculoskeletal: Normal tone Neuro: Alert, oriented, CN intact, reflexes symmetrical in upper/lower extremity Dermatologic: No obvious rashes Psychiatric: Slightly flat affect  No orders of the defined types were placed in this encounter.    Assessment and Plan    Health Maintenance Due  Topic Date Due  . Hepatitis C Screening  1956-04-04  . HIV Screening  07/11/1969  . TETANUS/TDAP  07/11/1973  . PAP SMEAR  06/22/2014  . ZOSTAVAX  07/12/2014  . INFLUENZA VACCINE  11/22/2014   - Pap smear: Sees OB/Gyn - CBC, CMP, Lipid panel, TSH

## 2015-02-02 NOTE — Assessment & Plan Note (Signed)
Refill Prozac

## 2015-02-03 LAB — TSH: TSH: 1.053 u[IU]/mL (ref 0.350–4.500)

## 2015-02-03 LAB — HEPATITIS C ANTIBODY: HCV AB: NEGATIVE

## 2015-02-07 ENCOUNTER — Encounter: Payer: Self-pay | Admitting: Family Medicine

## 2015-08-24 ENCOUNTER — Ambulatory Visit (INDEPENDENT_AMBULATORY_CARE_PROVIDER_SITE_OTHER): Payer: BLUE CROSS/BLUE SHIELD | Admitting: Internal Medicine

## 2015-08-24 ENCOUNTER — Encounter: Payer: Self-pay | Admitting: Internal Medicine

## 2015-08-24 VITALS — BP 138/70 | HR 71 | Temp 98.7°F | Wt 163.6 lb

## 2015-08-24 DIAGNOSIS — L03211 Cellulitis of face: Secondary | ICD-10-CM | POA: Diagnosis not present

## 2015-08-24 MED ORDER — CLINDAMYCIN HCL 300 MG PO CAPS: 300.0000 mg | ORAL_CAPSULE | Freq: Four times a day (QID) | ORAL | Status: DC

## 2015-08-24 NOTE — Assessment & Plan Note (Addendum)
-   Prescribed clindamycin 300 mg qid for 7 days.  - Gave patient strict return precautions and warned of potential need for IV antibiotics if erythema begins to extend closer to the eye. - Asked patient to follow-up by Friday if redness is not improving. - Recommended ibuprofen and tylenol for discomfort.

## 2015-08-24 NOTE — Progress Notes (Addendum)
   Subjective:    Patient ID: April Castillo, female    DOB: 1956-02-06, 60 y.o.   MRN: [id number]  HPI  April Castillo presents today for worsening swelling of right cheek after possible insect bite Sunday. She was gardening Sunday evening and started itching an area in front of her ear. Over the next couple of days, redness has spread, and she also notes right ear pain and tenderness with "a lump" under her jaw on the right. She tried benadryl with no improvement in swelling or erythema. She has no history of MRSA infection or boils.  Review of Systems  Constitutional: Negative for fever and chills.  HENT: Negative for ear discharge, ear pain and rhinorrhea.   Eyes: Negative for discharge.  Respiratory: Negative for cough, choking and chest tightness.   Gastrointestinal: Negative for nausea, vomiting and diarrhea.  Musculoskeletal: Negative for myalgias and joint swelling.   Social: Never Smoker    Objective:   Physical Exam  HENT:  Head: Normocephalic.  Right Ear: External ear normal.  Left Ear: External ear normal.  Eyes: Conjunctivae and EOM are normal.  Neck: Normal range of motion.  Lymphadenopathy:    She has cervical adenopathy (right-sided).  Skin: There is erythema.  Excoriated area with induration about 2x2 cm in front of right ear without fluctuance. Skin over right cheek with erythema and somewhat more warm to touch than on left. Please see pictures below. Minimal TTP over right cheek but mild TTP at angle of jaw on the right.            Assessment & Plan:  April Castillo is a 60-y.o. female with right facial cellulitis likely 2/2 insect bite. She has been afebrile and has minimal discomfort.   Facial cellulitis - Prescribed clindamycin 300 mg qid for 7 days.  - Gave patient strict return precautions and warned of potential need for IV antibiotics if erythema begins to extend closer to the eye. - Asked patient to follow-up by Friday if redness is not improving. -  Recommended ibuprofen and tylenol for discomfort.    Precepted patient with Dr. Gwendlyn Deutscher.   Olene Floss, MD [facility], PGY-1

## 2015-08-26 ENCOUNTER — Ambulatory Visit (INDEPENDENT_AMBULATORY_CARE_PROVIDER_SITE_OTHER): Payer: BLUE CROSS/BLUE SHIELD | Admitting: Internal Medicine

## 2015-08-26 ENCOUNTER — Ambulatory Visit: Payer: BLUE CROSS/BLUE SHIELD | Admitting: Family Medicine

## 2015-08-26 ENCOUNTER — Encounter: Payer: Self-pay | Admitting: Internal Medicine

## 2015-08-26 VITALS — BP 140/60 | HR 79 | Temp 98.0°F | Ht 66.0 in | Wt 162.9 lb

## 2015-08-26 DIAGNOSIS — L03211 Cellulitis of face: Secondary | ICD-10-CM

## 2015-08-26 MED ORDER — SULFAMETHOXAZOLE-TRIMETHOPRIM 800-160 MG PO TABS: 2.0000 | ORAL_TABLET | Freq: Two times a day (BID) | ORAL | Status: DC

## 2015-08-26 NOTE — Progress Notes (Signed)
   Subjective:    Patient ID: April Castillo, female    DOB: Sep 03, 1955, 60 y.o.   MRN: [id number]  HPI  April Castillo is a 60-y/o female here for follow-up of right facial cellulitis. Erythema began the evening of 4/30 surrounding two small bumps that had developed in front of her right ear. It is itchy when she scratches it. She suspects a bug bite since she had been out gardening earlier in the day. Benadryl did not help. She was started on clindamycin 300 mg qid the afternoon of 4/3 and has had 2 days of doses without improvement. She reports increased tenderness last night. She tried a warm compress to try to bring something to a head but with no success. She has some jaw pain when opening her mouth fully but has had no trouble eating or change in appetite. She denies chills or fever, no nausea or vomiting. The excoriated area near her ear has not drained anything. She has no vision changes.   Review of Systems  Constitutional: Negative for fever and chills.  HENT: Positive for ear pain. Negative for trouble swallowing.   Gastrointestinal: Negative for nausea and vomiting.  Musculoskeletal: Negative for myalgias and neck stiffness.   Social: Never Smoker    Objective:   Physical Exam  Constitutional: She appears well-developed and well-nourished. No distress.  HENT:  Right side of face fullness > L side.  Eyes: Conjunctivae and EOM are normal. Pupils are equal, round, and reactive to light.  Neck: Normal range of motion. Neck supple.  Lymphadenopathy:    She has cervical adenopathy (Right-sided with TTP and induration at angle of mandible and shotty lymph nodes along rigth jawline).  Skin:  Area of erythema similar to that noted 5/3 (see note from that date for picture) with perhaps some increased erythema along r. anterior jawline and expanded induration around excoriated region (2x1 cm). Warmth slightly increased over right cheek compared to left.       Assessment & Plan:  Given  that erythema has not extended closer to the eye and that patient is having no systemic symptoms, IV antibiotics and imaging not needed at this time. No improvement on clindamycin, so antibiotic therapy will be changed.  Patient asked to return Monday to assess for improvement.    Facial cellulitis - Stop clindamycin. Begin bactrim DS 2 tablets twice daily for a week. - Patient given strict return precautions.  April Floss, MD [facility], PGY-1

## 2015-08-26 NOTE — Assessment & Plan Note (Signed)
-   Stop clindamycin. Begin bactrim DS 2 tablets twice daily for a week. - Patient given strict return precautions.

## 2015-08-26 NOTE — Patient Instructions (Signed)
Ms. Ozog,  Please look out for spread to your eye, inability to open your jaw, fevers and chills.   The antibiotic is called bactrim. Please take 2 tablets twice a day for a week. Stop this medicine if you develop a new skin rash or oral ulcers.  I would like to see you again Monday to check for resolution. Please make an appointment at [facility].  Best, Dr. Ola Spurr

## 2015-08-29 ENCOUNTER — Encounter: Payer: Self-pay | Admitting: Family Medicine

## 2015-08-29 ENCOUNTER — Ambulatory Visit (INDEPENDENT_AMBULATORY_CARE_PROVIDER_SITE_OTHER): Payer: BLUE CROSS/BLUE SHIELD | Admitting: Family Medicine

## 2015-08-29 VITALS — BP 122/69 | HR 89 | Temp 98.3°F | Wt 159.8 lb

## 2015-08-29 DIAGNOSIS — L03211 Cellulitis of face: Secondary | ICD-10-CM | POA: Diagnosis not present

## 2015-08-29 LAB — BASIC METABOLIC PANEL WITH GFR
BUN: 20 mg/dL (ref 7–25)
CALCIUM: 9.8 mg/dL (ref 8.6–10.4)
CO2: 26 mmol/L (ref 20–31)
CREATININE: 1.09 mg/dL — AB (ref 0.50–0.99)
Chloride: 100 mmol/L (ref 98–110)
GFR, EST NON AFRICAN AMERICAN: 55 mL/min — AB (ref >=60)
GFR, Est African American: 63 mL/min (ref >=60)
GLUCOSE: 81 mg/dL (ref 65–99)
Potassium: 4.7 mmol/L (ref 3.5–5.3)
Sodium: 138 mmol/L (ref 135–146)

## 2015-08-29 NOTE — Progress Notes (Signed)
   Subjective:    Patient ID: April Castillo, female    DOB: 1955-07-10, 60 y.o.   MRN: [id number]  HPI  April Castillo is a 60-y/o female here for follow-up of right facial cellulitis. Erythema began the evening of 4/30 surrounding two small bumps that had developed in front of her right ear. She was started on clindamycin 300 mg qid the afternoon of 5/3 and has had 2 days of doses without improvement. She was switched to bactrim on 5/5 and has noticed significant improvement in the redness and swelling since then. She reports some decreased appetite and jumpy feeling since starting the new antibiotic. She denies chills or fever, no nausea or vomiting. The excoriated area near her ear has not drained anything. She has no vision changes.   Review of Systems  Constitutional: Negative for fever and chills.  HENT: Positive for ear pain. Negative for trouble swallowing.   Gastrointestinal: Negative for nausea and vomiting.  Musculoskeletal: Negative for myalgias and neck stiffness.   Social: Never Smoker    Objective:   Physical Exam  Constitutional: She appears well-developed and well-nourished. No distress.  Eyes: Conjunctivae and EOM are normal. Pupils are equal, round, and reactive to light.  Neck: Normal range of motion. Neck supple.  Lymphadenopathy:    She has no cervical adenopathy.  Skin:  Area of erythema similar to that noted 5/3 (see note from that date for picture) with significant decreased size and swelling. mild induration around excoriated region (2x1 cm). Warmth resolved.      Assessment & Plan:  Facial cellulitis Dramatic improvement after 2 days of bactrim, having some jumpiness and decreased appetite but otherwise tolerating well - given coadministration with lisinopril will check BMP to eval for hyperkalemia causing "jumpy" feeling - call with results and may need to hold lisinopril until completed abx course - f/u Friday, continue bactrim until then

## 2015-08-29 NOTE — Patient Instructions (Signed)
I will call with the results of the blood test, probably tomorrow.

## 2015-08-29 NOTE — Assessment & Plan Note (Signed)
Dramatic improvement after 2 days of bactrim, having some jumpiness and decreased appetite but otherwise tolerating well - given coadministration with lisinopril will check BMP to eval for hyperkalemia causing "jumpy" feeling - call with results and may need to hold lisinopril until completed abx course - f/u Friday, continue bactrim until then

## 2015-09-02 ENCOUNTER — Encounter: Payer: Self-pay | Admitting: Family Medicine

## 2015-09-02 ENCOUNTER — Ambulatory Visit (INDEPENDENT_AMBULATORY_CARE_PROVIDER_SITE_OTHER): Payer: BLUE CROSS/BLUE SHIELD | Admitting: Family Medicine

## 2015-09-02 VITALS — BP 144/54 | HR 90 | Temp 98.6°F | Ht 66.0 in | Wt 160.2 lb

## 2015-09-02 DIAGNOSIS — R748 Abnormal levels of other serum enzymes: Secondary | ICD-10-CM | POA: Diagnosis not present

## 2015-09-02 DIAGNOSIS — R739 Hyperglycemia, unspecified: Secondary | ICD-10-CM | POA: Diagnosis not present

## 2015-09-02 DIAGNOSIS — R7989 Other specified abnormal findings of blood chemistry: Secondary | ICD-10-CM

## 2015-09-02 DIAGNOSIS — L03211 Cellulitis of face: Secondary | ICD-10-CM | POA: Diagnosis not present

## 2015-09-02 LAB — POCT GLYCOSYLATED HEMOGLOBIN (HGB A1C): HEMOGLOBIN A1C: 5.4

## 2015-09-02 NOTE — Assessment & Plan Note (Signed)
Significantly improved. Finish last dose of antibiotics. Follow-up if worsening symptoms.

## 2015-09-02 NOTE — Progress Notes (Signed)
    Subjective    April Castillo is a 60 y.o. female that presents for a follow-up visit for:   1. Facial cellulitis: Patient presents today for follow-up of cellulitis. Symptoms have nearly resolved. No fevers, nausea of vomiting. Patient states she has one more pill of her bactrim. She reports some abdominal discomfort at times, described as bloating, which has occurred about two times. No abdominal pain. She reports normal bowels. She reports polyuria, but admits she has been drinking more fluids.  Social History  Substance Use Topics  . Smoking status: Never Smoker   . Smokeless tobacco: Never Used  . Alcohol Use: 1.8 oz/week    1 Glasses of wine, 2 Cans of beer per week     Comment: few drinks a week    No Known Allergies  No orders of the defined types were placed in this encounter.    ROS  Per HPI   Objective   Ht 5\' 6"  (1.676 m)  Wt 160 lb 3.2 oz (72.666 kg)  BMI 25.87 kg/m2  Vital signs reviewed  General: Well appearing, no distress Skin: slight erythema of right cheek  Assessment and Plan    Facial cellulitis Significantly improved. Finish last dose of antibiotics. Follow-up if worsening symptoms.

## 2015-09-02 NOTE — Patient Instructions (Signed)
Thank you for coming to see me today. It was a pleasure. Today we talked about:   Cellulitis: I am so glad your symptoms are better. Take your last antibiotic pill as directed.   Please make an appointment to see your new doctor in 5 months for a yearly physical.  If you have any questions or concerns, please do not hesitate to call the office at [phone].  Sincerely,  Cordelia Poche, MD

## 2015-09-03 LAB — BASIC METABOLIC PANEL WITH GFR
BUN: 20 mg/dL (ref 7–25)
CHLORIDE: 99 mmol/L (ref 98–110)
CO2: 24 mmol/L (ref 20–31)
Calcium: 9.6 mg/dL (ref 8.6–10.4)
Creat: 1.02 mg/dL — ABNORMAL HIGH (ref 0.50–0.99)
GFR, Est African American: 69 mL/min (ref >=60)
GFR, Est Non African American: 60 mL/min (ref >=60)
GLUCOSE: 95 mg/dL (ref 65–99)
POTASSIUM: 4.2 mmol/L (ref 3.5–5.3)
Sodium: 135 mmol/L (ref 135–146)

## 2016-01-24 ENCOUNTER — Other Ambulatory Visit: Payer: Self-pay | Admitting: *Deleted

## 2016-01-24 MED ORDER — FLUOXETINE HCL 20 MG PO CAPS: 20.0000 mg | ORAL_CAPSULE | Freq: Every day | ORAL | 0 refills | Status: DC

## 2016-01-24 MED ORDER — LISINOPRIL 10 MG PO TABS: 10.0000 mg | ORAL_TABLET | Freq: Every day | ORAL | 0 refills | Status: DC

## 2016-01-24 NOTE — Telephone Encounter (Signed)
Patient is due for a preventative annual exam. Please have her schedule an appointment.   Phill Myron, D.O. 01/24/2016, 10:21 AM PGY-2, [place]

## 2016-01-26 NOTE — Telephone Encounter (Signed)
LVM for pt to call the office. If pt calls, please schedule an annual exam. Ottis Stain, CMA

## 2016-01-27 NOTE — Telephone Encounter (Signed)
Pt has appointment scheduled on 02/10/16. Katharina Caper, Aster [address]ws [address], [place]

## 2016-02-10 ENCOUNTER — Ambulatory Visit (INDEPENDENT_AMBULATORY_CARE_PROVIDER_SITE_OTHER): Payer: BLUE CROSS/BLUE SHIELD | Admitting: Internal Medicine

## 2016-02-10 ENCOUNTER — Encounter: Payer: Self-pay | Admitting: Internal Medicine

## 2016-02-10 VITALS — BP 130/78 | HR 77 | Temp 98.2°F | Ht 66.0 in | Wt 165.4 lb

## 2016-02-10 DIAGNOSIS — I10 Essential (primary) hypertension: Secondary | ICD-10-CM

## 2016-02-10 DIAGNOSIS — Z113 Encounter for screening for infections with a predominantly sexual mode of transmission: Secondary | ICD-10-CM | POA: Diagnosis not present

## 2016-02-10 DIAGNOSIS — Z23 Encounter for immunization: Secondary | ICD-10-CM

## 2016-02-10 DIAGNOSIS — E785 Hyperlipidemia, unspecified: Secondary | ICD-10-CM

## 2016-02-10 DIAGNOSIS — J309 Allergic rhinitis, unspecified: Secondary | ICD-10-CM

## 2016-02-10 LAB — COMPLETE METABOLIC PANEL WITH GFR
ALT: 16 U/L (ref 6–29)
AST: 18 U/L (ref 10–35)
Albumin: 4.5 g/dL (ref 3.6–5.1)
Alkaline Phosphatase: 69 U/L (ref 33–130)
BUN: 15 mg/dL (ref 7–25)
CHLORIDE: 105 mmol/L (ref 98–110)
CO2: 28 mmol/L (ref 20–31)
Calcium: 9.3 mg/dL (ref 8.6–10.4)
Creat: 0.81 mg/dL (ref 0.50–0.99)
GFR, Est African American: >89 mL/min (ref >=60)
GFR, Est Non African American: 79 mL/min (ref >=60)
GLUCOSE: 86 mg/dL (ref 65–99)
POTASSIUM: 4.3 mmol/L (ref 3.5–5.3)
SODIUM: 142 mmol/L (ref 135–146)
Total Bilirubin: 1 mg/dL (ref 0.2–1.2)
Total Protein: 7.1 g/dL (ref 6.1–8.1)

## 2016-02-10 LAB — LIPID PANEL
CHOL/HDL RATIO: 4.1 ratio (ref <=5.0)
Cholesterol: 227 mg/dL — ABNORMAL HIGH (ref 125–200)
HDL: 55 mg/dL (ref >=46)
LDL Cholesterol: 150 mg/dL — ABNORMAL HIGH (ref <130)
Triglycerides: 111 mg/dL (ref <150)
VLDL: 22 mg/dL (ref <30)

## 2016-02-10 MED ORDER — FLUTICASONE PROPIONATE 50 MCG/ACT NA SUSP: 2.0000 | Freq: Every day | NASAL | 6 refills | Status: DC

## 2016-02-10 MED ORDER — LISINOPRIL 10 MG PO TABS: 10.0000 mg | ORAL_TABLET | Freq: Every day | ORAL | 3 refills | Status: DC

## 2016-02-10 MED ORDER — OLOPATADINE HCL 0.1 % OP SOLN: 1.0000 [drp] | Freq: Two times a day (BID) | OPHTHALMIC | 5 refills | Status: DC

## 2016-02-10 NOTE — Patient Instructions (Addendum)
Please check with your Gynecologist that you are up to date with your mammograms.   I have sent in Flonase and Pataday (eye drops) for your allergic symptoms.   I have refilled your Lisinopril.   I will call you with lab results and let you know if a Statin is indicated.   Take Care,   Dr. Juleen China

## 2016-02-10 NOTE — Progress Notes (Signed)
   Subjective:    April Castillo - 60 y.o. female MRN [id number]  Date of birth: April 14, 1956  HPI  [name] is here for annual physical exam.   Concerns today: Allergy symptoms. Reports nasal congestion and itchy, watery, red eyes bilaterally. Has some old Flonase that she has been using at home that seems to help when she does use it. Has been using OTC red eye relief eye drops.  Periods: Post-menopausal  Contraception: None  Pelvic symptoms: None  Sexual activity: Active with one partner  STD Screening: Declines  Pap smear status: Reports UTD (goes to [employer])  Exercise: Not regular. Bike rides once per week. Does heavy gardening weekly.  Diet: No particular diet.  Smoking: Never smoker  Alcohol: Drinks 2-4 times per month, 1-2 drinks each time.  Drugs: none  Mood: PHQ-2 score of 0    ROS:  Patient reports no  vision/ hearing changes,anorexia, weight change, fever ,adenopathy, persistant / recurrent hoarseness, swallowing issues, chest pain, edema,persistant / recurrent cough, hemoptysis, dyspnea(rest, exertional, paroxysmal nocturnal), gastrointestinal  bleeding (melena, rectal bleeding), abdominal pain, excessive heart burn, GU symptoms(dysuria, hematuria, pyuria, voiding/incontinence  Issues) syncope, focal weakness, severe memory loss, concerning skin lesions, depression, anxiety, abnormal bruising/bleeding, major joint swelling, breast masses or abnormal vaginal bleeding.     Health Maintenance Due  Topic Date Due  . HIV Screening  07/11/1969  . PAP SMEAR  06/22/2014  . ZOSTAVAX  07/12/2014  . INFLUENZA VACCINE  11/22/2015    -  reports that she has never smoked. She has never used smokeless tobacco. - Review of Systems: Per HPI. - Past Medical History: Patient Active Problem List   Diagnosis Date Noted  . Facial cellulitis 08/24/2015  . Hyperlipidemia 01/01/2013  . Allergic rhinitis 12/12/2011  . Breast mass, right 11/23/2010  . WEIGHT GAIN  11/07/2009  . Depression 09/26/2006  . Essential hypertension 09/26/2006   - Medications: reviewed and updated    Objective:   Physical Exam BP 130/78   Pulse 77   Temp 98.2 F (36.8 C) (Oral)   Ht 5\' 6"  (1.676 m)   Wt 165 lb 6.4 oz (75 kg)   SpO2 95%   BMI 26.70 kg/m  Gen: NAD, alert, cooperative with exam, well-appearing HEENT: NCAT, PERRL, mildly injected sclera bilaterally, oropharynx clear, swollen, red turbinates bilaterally, supple neck CV: RRR, good S1/S2, no murmur, no edema, capillary refill brisk  Resp: CTABL, no wheezes, non-labored Abd: SNTND, BS present, no guarding or organomegaly Skin: no rashes, normal turgor  Neuro: no gross deficits.  Psych: good insight, alert and oriented   Assessment & Plan:   Essential hypertension At goal today.  -refill for Lisinopril given  -CMET ordered   Hyperlipidemia LDL has been elevated in the past. Patient has never been on statin therapy. Has never had MI or CVA.  -Lipid panel and CMET today  -will calculate ASCVD risk score and recommend statin therapy if indicated   Allergic rhinitis Symptoms consistent with allergic rhinitis and conjunctivitis.  -Rx for Flonase and Olopatadine    Health Maintenance:  Asked patient to request records from PAP and Mammogram. Flu shot given today. HIV collected.    Phill Myron, D.O. 02/10/2016, 10:10 AM PGY-2, [place]

## 2016-02-10 NOTE — Assessment & Plan Note (Signed)
At goal today.  -refill for Lisinopril given  -CMET ordered

## 2016-02-10 NOTE — Assessment & Plan Note (Signed)
LDL has been elevated in the past. Patient has never been on statin therapy. Has never had MI or CVA.  -Lipid panel and CMET today  -will calculate ASCVD risk score and recommend statin therapy if indicated

## 2016-02-10 NOTE — Assessment & Plan Note (Addendum)
Symptoms consistent with allergic rhinitis and conjunctivitis.  -Rx for Flonase and Olopatadine

## 2016-02-11 LAB — HIV ANTIBODY (ROUTINE TESTING W REFLEX): HIV: NONREACTIVE

## 2016-02-17 ENCOUNTER — Telehealth: Payer: Self-pay

## 2016-02-17 NOTE — Telephone Encounter (Signed)
LVM for pt to call the office on Monday. If she calls, please inform her of the information below.  Per Dr. Juleen China:  Please call patient and let her know that HIV was negative. Liver enzymes, kidney function, and electrolytes normal. I calculated her ten year risk of having a stroke and heart attack based on her cholesterol results, age, sex, and medical history. Her ten year risk was 5.5% so it is not recommended that she take the statin medication to lower cholesterol because she is low risk. Please have her call me if she has any questions Ottis Stain, CMA

## 2016-03-05 ENCOUNTER — Encounter: Payer: Self-pay | Admitting: Internal Medicine

## 2016-04-27 ENCOUNTER — Other Ambulatory Visit: Payer: Self-pay | Admitting: Internal Medicine

## 2016-07-19 ENCOUNTER — Other Ambulatory Visit: Payer: Self-pay | Admitting: Internal Medicine

## 2016-10-17 ENCOUNTER — Other Ambulatory Visit: Payer: Self-pay | Admitting: Internal Medicine

## 2016-10-28 ENCOUNTER — Other Ambulatory Visit: Payer: Self-pay | Admitting: Internal Medicine

## 2017-01-26 ENCOUNTER — Other Ambulatory Visit: Payer: Self-pay | Admitting: Internal Medicine

## 2017-04-17 ENCOUNTER — Other Ambulatory Visit: Payer: Self-pay | Admitting: Internal Medicine

## 2017-05-23 ENCOUNTER — Encounter: Payer: BLUE CROSS/BLUE SHIELD | Admitting: Internal Medicine

## 2017-05-29 ENCOUNTER — Other Ambulatory Visit: Payer: Self-pay

## 2017-05-29 ENCOUNTER — Ambulatory Visit (INDEPENDENT_AMBULATORY_CARE_PROVIDER_SITE_OTHER): Payer: Managed Care, Other (non HMO) | Admitting: Internal Medicine

## 2017-05-29 ENCOUNTER — Encounter: Payer: Self-pay | Admitting: Internal Medicine

## 2017-05-29 VITALS — BP 120/72 | HR 67 | Temp 98.1°F | Wt 168.0 lb

## 2017-05-29 DIAGNOSIS — I10 Essential (primary) hypertension: Secondary | ICD-10-CM

## 2017-05-29 DIAGNOSIS — Z23 Encounter for immunization: Secondary | ICD-10-CM | POA: Diagnosis not present

## 2017-05-29 DIAGNOSIS — Z0000 Encounter for general adult medical examination without abnormal findings: Secondary | ICD-10-CM | POA: Diagnosis not present

## 2017-05-29 DIAGNOSIS — E785 Hyperlipidemia, unspecified: Secondary | ICD-10-CM

## 2017-05-29 DIAGNOSIS — J069 Acute upper respiratory infection, unspecified: Secondary | ICD-10-CM

## 2017-05-29 NOTE — Progress Notes (Signed)
   Subjective:    April Castillo - 62 y.o. female MRN [id number]  Date of birth: 10-09-1955  HPI   April Castillo is here for annual exam. Concerns about congestion.   Congestion: Has had significant nasal congestion x2 weeks. Staying the same. No cough, fevers, or SOB. No facial pressure or pain, no headaches. Has not tried anything at home.    ROS:  Patient reports no  vision/ hearing changes,anorexia, weight change, fever ,adenopathy, persistant / recurrent hoarseness, swallowing issues, chest pain, edema,persistant / recurrent cough, hemoptysis, dyspnea(rest, exertional, paroxysmal nocturnal), gastrointestinal  bleeding (melena, rectal bleeding), abdominal pain, excessive heart burn, GU symptoms(dysuria, hematuria, pyuria, voiding/incontinence  Issues) syncope, focal weakness, severe memory loss, concerning skin lesions, depression, anxiety, abnormal bruising/bleeding, major joint swelling, breast masses or abnormal vaginal bleeding.     Health Maintenance Due  Topic Date Due  . PAP SMEAR  06/22/2014  . MAMMOGRAM  03/13/2016  . INFLUENZA VACCINE  11/21/2016    -  reports that  has never smoked. she has never used smokeless tobacco. - Review of Systems: Per HPI. - Past Medical History: Patient Active Problem List   Diagnosis Date Noted  . Facial cellulitis 08/24/2015  . Hyperlipidemia 01/01/2013  . Allergic rhinitis 12/12/2011  . Breast mass, right 11/23/2010  . WEIGHT GAIN 11/07/2009  . Depression 09/26/2006  . Essential hypertension 09/26/2006   - Medications: reviewed and updated   Objective:   Physical Exam BP 120/72   Pulse 67   Temp 98.1 F (36.7 C) (Oral)   Wt 168 lb (76.2 kg)   SpO2 97%   BMI 27.12 kg/m  Gen: NAD, alert, cooperative with exam, well-appearing HEENT: NCAT, PERRL, clear conjunctiva, oropharynx clear, supple neck, nasal turbinates edematous bilaterally  CV: RRR, good S1/S2, no murmur, no edema, capillary refill brisk  Resp: CTABL, no wheezes,  non-labored Abd: SNTND, BS present, no guarding or organomegaly Skin: no rashes, normal turgor  Neuro: no gross deficits.  Psych: good insight, alert and oriented    Assessment & Plan:    1. Essential hypertension Well controlled today with Lisinopril 10 mg.  - Basic Metabolic Panel  2. Hyperlipidemia, unspecified hyperlipidemia type History of elevated LDL in the past. Never been on statin therapy. Has never had MI or CVA.  - Lipid Panel -will calculate ASCVD risk score and recommend statin therapy if indicated   3. Need for immunization against influenza - Flu Vaccine QUAD 36+ mos IM  4. Viral URI Exam and history consistent with viral URI. Have recommended nasal saline spray for congestion. Continue home Flonase. Return precautions discussed.   5. Healthcare maintenance -reports she had a mammogram 2-3 weeks ago, report pending  -due for PAP but follows with GYN, says she will call for an appointment   Phill Myron, D.O. 05/29/2017, 11:35 AM PGY-3, [place]

## 2017-05-29 NOTE — Patient Instructions (Signed)
I am checking some basic labs today and will call you with the results. Please contact your Gynecologist about getting your PAP smear.   I think you have a virus causing your congestion. I would recommend nasal saline spray to help with congestion and to help keep your nasal passages more moist. If you start having fevers, are feeling better and then suddenly congestion worsens, or if you have facial pressure/pain please let us know. These may be signs of a bacterial infection.

## 2017-05-30 LAB — BASIC METABOLIC PANEL
BUN/Creatinine Ratio: 28 (ref 12–28)
BUN: 18 mg/dL (ref 8–27)
CHLORIDE: 104 mmol/L (ref 96–106)
CO2: 27 mmol/L (ref 20–29)
Calcium: 9.5 mg/dL (ref 8.7–10.3)
Creatinine, Ser: 0.65 mg/dL (ref 0.57–1.00)
GFR calc Af Amer: 110 mL/min/{1.73_m2} (ref >59)
GFR, EST NON AFRICAN AMERICAN: 95 mL/min/{1.73_m2} (ref >59)
Glucose: 88 mg/dL (ref 65–99)
POTASSIUM: 4.7 mmol/L (ref 3.5–5.2)
SODIUM: 144 mmol/L (ref 134–144)

## 2017-05-30 LAB — LIPID PANEL
CHOL/HDL RATIO: 4.2 ratio (ref 0.0–4.4)
Cholesterol, Total: 224 mg/dL — ABNORMAL HIGH (ref 100–199)
HDL: 53 mg/dL (ref >39)
LDL Calculated: 151 mg/dL — ABNORMAL HIGH (ref 0–99)
TRIGLYCERIDES: 101 mg/dL (ref 0–149)
VLDL Cholesterol Cal: 20 mg/dL (ref 5–40)

## 2017-06-05 ENCOUNTER — Encounter: Payer: Self-pay | Admitting: Internal Medicine

## 2017-06-05 NOTE — Progress Notes (Signed)
Letter with results routed to admin pool to be sent to patient.   Phill Myron, D.O. 06/05/2017, 11:10 AM PGY-3, [place]

## 2017-07-10 ENCOUNTER — Other Ambulatory Visit: Payer: Self-pay | Admitting: Internal Medicine

## 2017-10-02 ENCOUNTER — Other Ambulatory Visit: Payer: Self-pay | Admitting: Internal Medicine

## 2017-10-03 ENCOUNTER — Other Ambulatory Visit: Payer: Self-pay | Admitting: Internal Medicine

## 2017-10-31 ENCOUNTER — Telehealth: Payer: Self-pay

## 2017-10-31 NOTE — Telephone Encounter (Signed)
Patient called nurse line asking for appt for a swollen, itchy eye. Left call back number of [phone].  Called patient x 2 and both times call was answered but patient could not hear me. Will await call back.  Danley Danker, RN [facility] RN)

## 2017-11-07 ENCOUNTER — Encounter: Payer: Self-pay | Admitting: Family Medicine

## 2017-11-07 ENCOUNTER — Other Ambulatory Visit: Payer: Self-pay

## 2017-11-07 ENCOUNTER — Ambulatory Visit (INDEPENDENT_AMBULATORY_CARE_PROVIDER_SITE_OTHER): Payer: Managed Care, Other (non HMO) | Admitting: Family Medicine

## 2017-11-07 DIAGNOSIS — H109 Unspecified conjunctivitis: Secondary | ICD-10-CM | POA: Insufficient documentation

## 2017-11-07 MED ORDER — OLOPATADINE HCL 0.1 % OP SOLN: 1.0000 [drp] | Freq: Two times a day (BID) | OPHTHALMIC | 12 refills | Status: DC

## 2017-11-07 NOTE — Patient Instructions (Signed)
I believe this is an allergic conjunctivits.  Much less likely is a prolonged viral infection.  Either way, the drops I prescibed should work. Pay attention to how long it last and whether it comes back about the same time next year. I would use your fluticazone nasal spray regularly.  It has been shown to help eye allergies.

## 2017-11-07 NOTE — Assessment & Plan Note (Addendum)
Likely allergic.  Less likely prolonged viral.  Rx eye drops.  Encourage regular use of flonase.

## 2017-11-08 ENCOUNTER — Encounter: Payer: Self-pay | Admitting: Family Medicine

## 2017-11-08 NOTE — Progress Notes (Signed)
   Subjective:    Patient ID: April Castillo, female    DOB: 1956/04/15, 62 y.o.   MRN: [id number]  HPI Patient with swollen, red, itchy eye x 1 month Bilateral with Left>right.  Neither worsening nor improving.  She has sensitive skin and per doc (derm versus ophth?) instructions has been using hydrocortisone on her upper eyelid.  Only visual change is that is tough to read at night.  Vision reliably clears with blinking.  No eye pain or photopobia.  No systemic sx such as fever.  She is up to date on PAP and mammo.  Sees gyn Deatra Ina.    Review of Systems     Objective:   Physical Exam Bilateral conjunctivitis Lt> right.  Some swelling but no redness of left upper eyelid.  No perilimbal injection.  Media clear on fundoscopic.          Assessment & Plan:

## 2018-01-13 ENCOUNTER — Other Ambulatory Visit: Payer: Self-pay

## 2018-01-15 MED ORDER — FLUOXETINE HCL 20 MG PO CAPS: 20.0000 mg | ORAL_CAPSULE | Freq: Every day | ORAL | 1 refills | Status: AC

## 2018-01-15 NOTE — Addendum Note (Signed)
Addended by: Esau Grew on: 01/15/2018 08:39 AM   Modules accepted: Orders

## 2019-10-27 ENCOUNTER — Inpatient Hospital Stay (HOSPITAL_COMMUNITY)
Admission: EM | Admit: 2019-10-27 | Discharge: 2019-11-06 | DRG: 517 | Disposition: A | Discharge status: Skilled Nursing Facility | Payer: Medicare Other | Attending: Internal Medicine | Admitting: Internal Medicine

## 2019-10-27 ENCOUNTER — Encounter (HOSPITAL_COMMUNITY): Payer: Self-pay

## 2019-10-27 ENCOUNTER — Emergency Department (HOSPITAL_COMMUNITY): Payer: Medicare Other

## 2019-10-27 DIAGNOSIS — W5519XA Other contact with horse, initial encounter: Secondary | ICD-10-CM

## 2019-10-27 DIAGNOSIS — S82011A Displaced osteochondral fracture of right patella, initial encounter for closed fracture: Principal | ICD-10-CM | POA: Diagnosis present

## 2019-10-27 DIAGNOSIS — W19XXXA Unspecified fall, initial encounter: Secondary | ICD-10-CM

## 2019-10-27 DIAGNOSIS — S82091A Other fracture of right patella, initial encounter for closed fracture: Secondary | ICD-10-CM | POA: Diagnosis present

## 2019-10-27 DIAGNOSIS — F329 Major depressive disorder, single episode, unspecified: Secondary | ICD-10-CM | POA: Diagnosis present

## 2019-10-27 DIAGNOSIS — S62609A Fracture of unspecified phalanx of unspecified finger, initial encounter for closed fracture: Secondary | ICD-10-CM | POA: Diagnosis present

## 2019-10-27 DIAGNOSIS — F324 Major depressive disorder, single episode, in partial remission: Secondary | ICD-10-CM | POA: Diagnosis present

## 2019-10-27 DIAGNOSIS — Z419 Encounter for procedure for purposes other than remedying health state, unspecified: Secondary | ICD-10-CM

## 2019-10-27 DIAGNOSIS — Z7983 Long term (current) use of bisphosphonates: Secondary | ICD-10-CM

## 2019-10-27 DIAGNOSIS — Z803 Family history of malignant neoplasm of breast: Secondary | ICD-10-CM

## 2019-10-27 DIAGNOSIS — S82001A Unspecified fracture of right patella, initial encounter for closed fracture: Secondary | ICD-10-CM

## 2019-10-27 DIAGNOSIS — Z8349 Family history of other endocrine, nutritional and metabolic diseases: Secondary | ICD-10-CM

## 2019-10-27 DIAGNOSIS — I10 Essential (primary) hypertension: Secondary | ICD-10-CM | POA: Diagnosis present

## 2019-10-27 DIAGNOSIS — S82041A Displaced comminuted fracture of right patella, initial encounter for closed fracture: Secondary | ICD-10-CM

## 2019-10-27 DIAGNOSIS — R52 Pain, unspecified: Secondary | ICD-10-CM | POA: Diagnosis present

## 2019-10-27 DIAGNOSIS — Z79899 Other long term (current) drug therapy: Secondary | ICD-10-CM

## 2019-10-27 DIAGNOSIS — S62650A Nondisplaced fracture of medial phalanx of right index finger, initial encounter for closed fracture: Secondary | ICD-10-CM

## 2019-10-27 DIAGNOSIS — E785 Hyperlipidemia, unspecified: Secondary | ICD-10-CM | POA: Diagnosis present

## 2019-10-27 DIAGNOSIS — Z8249 Family history of ischemic heart disease and other diseases of the circulatory system: Secondary | ICD-10-CM

## 2019-10-27 DIAGNOSIS — Z20822 Contact with and (suspected) exposure to covid-19: Secondary | ICD-10-CM | POA: Diagnosis present

## 2019-10-27 DIAGNOSIS — Z7982 Long term (current) use of aspirin: Secondary | ICD-10-CM

## 2019-10-27 DIAGNOSIS — Z87442 Personal history of urinary calculi: Secondary | ICD-10-CM

## 2019-10-27 LAB — BASIC METABOLIC PANEL
Anion gap: 11 (ref 5–15)
BUN: 20 mg/dL (ref 8–23)
CO2: 23 mmol/L (ref 22–32)
Calcium: 9.2 mg/dL (ref 8.9–10.3)
Chloride: 107 mmol/L (ref 98–111)
Creatinine, Ser: 0.78 mg/dL (ref 0.44–1.00)
GFR calc Af Amer: >60 mL/min (ref >60)
GFR calc non Af Amer: >60 mL/min (ref >60)
Glucose, Bld: 118 mg/dL — ABNORMAL HIGH (ref 70–99)
Potassium: 4.2 mmol/L (ref 3.5–5.1)
Sodium: 141 mmol/L (ref 135–145)

## 2019-10-27 LAB — CBC
HCT: 36.3 % (ref 36.0–46.0)
Hemoglobin: 11.6 g/dL — ABNORMAL LOW (ref 12.0–15.0)
MCH: 31.1 pg (ref 26.0–34.0)
MCHC: 32 g/dL (ref 30.0–36.0)
MCV: 97.3 fL (ref 80.0–100.0)
Platelets: 217 10*3/uL (ref 150–400)
RBC: 3.73 MIL/uL — ABNORMAL LOW (ref 3.87–5.11)
RDW: 12.7 % (ref 11.5–15.5)
WBC: 10.9 10*3/uL — ABNORMAL HIGH (ref 4.0–10.5)
nRBC: 0 % (ref 0.0–0.2)

## 2019-10-27 MED ORDER — ONDANSETRON 4 MG PO TBDP
4.0000 mg | ORAL_TABLET | Freq: Once | ORAL | Status: AC
  Administered 2019-10-27: 4 mg via ORAL
  Filled 2019-10-27: qty 1

## 2019-10-27 MED ORDER — FENTANYL CITRATE (PF) 100 MCG/2ML IJ SOLN: 50.0000 ug | Freq: Once | INTRAMUSCULAR | Status: DC

## 2019-10-27 MED ORDER — MORPHINE SULFATE (PF) 4 MG/ML IV SOLN
4.0000 mg | Freq: Once | INTRAVENOUS | Status: AC
  Administered 2019-10-27: 4 mg via INTRAVENOUS
  Filled 2019-10-27: qty 1

## 2019-10-27 MED ORDER — OXYCODONE-ACETAMINOPHEN 5-325 MG PO TABS
1.0000 | ORAL_TABLET | Freq: Once | ORAL | Status: AC
  Administered 2019-10-27: 1 via ORAL
  Filled 2019-10-27: qty 1

## 2019-10-27 NOTE — Progress Notes (Signed)
Orthopedic Tech Progress Note Patient Details:  April Castillo 04-01-1956 [id number]  Ortho Devices Type of Ortho Device: Finger splint, Knee Immobilizer, Crutches Ortho Device/Splint Location: RUE, RLE Ortho Device/Splint Interventions: Ordered, Application, Adjustment   Post Interventions Patient Tolerated: Well Instructions Provided: Adjustment of device, Care of device, Poper ambulation with device   [name] 10/27/2019, 9:38 PM

## 2019-10-27 NOTE — ED Provider Notes (Signed)
Patient care signed out at end of shift by Phia Caccavale, PA-C, pending admission for pain control after sustaining a patellar fracture during accident involving horse earlier today Needs admission for pain control Morphine, fentanyl and percocet given in attempt to control pain unsuccessful - uncomfortable but tolerating pain at rest but can't mobilize.   Labs nonconcerning. Not on blood thinners. COVID pending.   Ortho consulted [facility] Fortes). Dr. Marlou Sa will see her in the morning.   Discussed with the hospitalist, Dr. Hannah Beat, [facility], who accepts for admission.   Charlann Lange, PA-C 10/28/19 0015    Margette Fast, MD 10/30/19 1400

## 2019-10-27 NOTE — ED Provider Notes (Signed)
[facility] Provider Note   CSN: [id number] Arrival date & time: 10/27/19  1712     History Chief Complaint  Patient presents with  . Knee Pain    April Castillo is a 64 y.o. female presenting for evaluation of bilateral knee and R hand pain.   Pt states just prior to arrival the horse that was tied to the fence broke the fence and the wooden plank hit her on the back of the head. This caused her to fall to the ground onto concrete, injuring both knees. She has been unable to ambulate since, and reports severe pain of there R knee. She denies HA, neck pain, CP, abd pain, pelvis pain, or back pain. She denies LOC. She is not on blood thinners. She has seen Dr. Marlou Sa with orthopedics previously, but not for knee issues. She has not taken anything for pain.   HPI     Past Medical History:  Diagnosis Date  . Depression   . Heart murmur   . Hip fracture, right ([place]) 11/23/2010  . Hypertension   . Kidney stone   . KNEE PAIN, LEFT 11/07/2009  . Other and unspecified hyperlipidemia 01/01/2013  . PERIMENOPAUSAL SYNDROME 11/07/2009  . Rosacea 06/20/2006  . Tension headache 06/20/2006       . UTERINE FIBROID 06/20/2006    Patient Active Problem List   Diagnosis Date Noted  . Conjunctivitis 11/07/2017  . Hyperlipidemia 01/01/2013  . Allergic rhinitis 12/12/2011  . Breast mass, right 11/23/2010  . WEIGHT GAIN 11/07/2009  . Depression 09/26/2006  . Essential hypertension 09/26/2006    Past Surgical History:  Procedure Laterality Date  . CATARACT EXTRACTION W/ INTRAOCULAR LENS IMPLANT Left 08/03/13   Dr. Katy Fitch @ [facility]  . EYE SURGERY Right    Cataract  . r hip fracture and repair  January 24, 2010     OB History   No obstetric history on file.     Family History  Problem Relation Age of Onset  . Cancer Father   . Hypertension Father   . Cancer Sister   . Thyroid disease Sister   . Breast cancer Sister   . Cancer Maternal Uncle   .  Diabetes Neg Hx   . Heart disease Neg Hx     Social History   Tobacco Use  . Smoking status: Never Smoker  . Smokeless tobacco: Never Used  Substance Use Topics  . Alcohol use: Yes    Alcohol/week: 3.0 standard drinks    Types: 1 Glasses of wine, 2 Cans of beer per week    Comment: few drinks a week  . Drug use: No    Home Medications Prior to Admission medications   Medication Sig Start Date End Date Taking? Authorizing Provider  alendronate (FOSAMAX) 70 MG tablet Take 70 mg by mouth once a week. Take with a full glass of water on an empty stomach.   Yes [provider]  aspirin EC 81 MG tablet Take 81 mg by mouth daily. Swallow whole.   Yes [provider]  FLUoxetine (PROZAC) 20 MG capsule Take 1 capsule (20 mg total) by mouth daily. 01/15/18  Yes Bland, Scott, DO  ibuprofen (ADVIL,MOTRIN) 800 MG tablet Take 800 mg by mouth every 8 (eight) hours as needed. for low back pain   Yes [provider]  lisinopril (PRINIVIL,ZESTRIL) 10 MG tablet TAKE 1 TABLET DAILY Patient taking differently: Take 10 mg by mouth daily.  10/03/17  Yes Nicolette Bang, DO  rosuvastatin (CRESTOR) 20 MG tablet Take 20 mg by mouth daily.   Yes [provider]  fluticasone (FLONASE) 50 MCG/ACT nasal spray Place 2 sprays into both nostrils daily. Patient not taking: Reported on 10/27/2019 02/10/16   Nicolette Bang, DO  olopatadine (PATANOL) 0.1 % ophthalmic solution Place 1 drop into both eyes 2 (two) times daily. Patient not taking: Reported on 10/27/2019 11/07/17   Zenia Resides, MD    Allergies    Patient has no known allergies.  Review of Systems   Review of Systems  Musculoskeletal: Positive for arthralgias and joint swelling.  All other systems reviewed and are negative.   Physical Exam Updated Vital Signs BP 121/70 (BP Location: Right Arm)   Pulse 93   Temp 99 F (37.2 C) (Oral)   Resp 19   Ht 5\' 6"  (1.676 m)   Wt 72.6 kg   SpO2 93%    BMI 25.82 kg/m   Physical Exam Vitals and nursing note reviewed.  Constitutional:      General: She is not in acute distress.    Appearance: She is well-developed.     Comments: Appears nontoxic  HENT:     Head: Normocephalic and atraumatic.     Comments: No signs of head trauma. No hemotympanum or nasal septal hematoma.  No trismus or malocclusion.  No tenderness palpation of the head or scalp. Eyes:     Conjunctiva/sclera: Conjunctivae normal.     Pupils: Pupils are equal, round, and reactive to light.  Neck:     Comments: No tenderness palpation midline C-spine.  No step-offs or deformities.  Full active range of motion of the head without pain. Cardiovascular:     Rate and Rhythm: Normal rate and regular rhythm.     Pulses: Normal pulses.  Pulmonary:     Effort: Pulmonary effort is normal. No respiratory distress.     Breath sounds: Normal breath sounds. No wheezing.     Comments: no ttp of the chest wall Chest:     Chest wall: No tenderness.  Abdominal:     General: There is no distension.     Palpations: Abdomen is soft. There is no mass.     Tenderness: There is no abdominal tenderness. There is no guarding or rebound.     Comments: No tenderness palpation of the abdomen  Musculoskeletal:        General: Swelling, tenderness and deformity present.     Cervical back: Normal range of motion and neck supple.     Comments: Significant swelling of the anterior right knee.  Tenderness palpation of the anterior knee.  No tenderness palpation of the posterior knee.  No calf or thigh tenderness.  Pedal pulses 2+ bilaterally.  Good distal sensation and cap refill of the feet.  Full active range of motion the ankle and toes without difficulty. Mild swelling of the anterior left knee with tenderness over the patella.  No tenderness palpation elsewhere over the knee. Tenderness and swelling of the right index finger PIP.  Patient with limited range of motion due to pain.  Good distal  sensation and cap refill of the right fingers.  Full active range of motion of the wrist without pain.  No obvious injury deformity elsewhere in the extremities. No tenderness palpation of back or midline spine.  No step-offs or deformities.  No tenderness palpation of the pelvis, is stable and intact.  Skin:    General: Skin is  warm and dry.     Capillary Refill: Capillary refill takes less than 2 seconds.  Neurological:     Mental Status: She is alert and oriented to person, place, and time.     ED Results / Procedures / Treatments   Labs (all labs ordered are listed, but only abnormal results are displayed) Labs Reviewed  CBC - Abnormal; Notable for the following components:      Result Value   WBC 10.9 (*)    RBC 3.73 (*)    Hemoglobin 11.6 (*)    All other components within normal limits  BASIC METABOLIC PANEL - Abnormal; Notable for the following components:   Glucose, Bld 118 (*)    All other components within normal limits  SARS CORONAVIRUS 2 BY RT PCR (HOSPITAL ORDER, [facility])    EKG None  Radiology DG Knee Complete 4 Views Left  Result Date: 10/27/2019 CLINICAL DATA:  Status post trauma. EXAM: LEFT KNEE - COMPLETE 4+ VIEW COMPARISON:  None. FINDINGS: No evidence of acute fracture or dislocation. No evidence of arthropathy or other focal bone abnormality. There is a small joint effusion. IMPRESSION: No acute fracture or dislocation. Small joint effusion. Electronically Signed   By: Virgina Norfolk M.D.   On: 10/27/2019 19:31   DG Knee Complete 4 Views Right  Result Date: 10/27/2019 CLINICAL DATA:  64 year old female with fall bilateral knee pain. EXAM: RIGHT KNEE - COMPLETE 4+ VIEW COMPARISON:  Left knee radiograph dated 10/27/2019. FINDINGS: There is a comminuted fracture of the patella with 3 main displaced fracture fragments. There is no dislocation. The bones are osteopenic. There is a moderate joint effusion. The soft tissue swelling of  the anterior knee. No radiopaque foreign object or soft tissue gas. IMPRESSION: 1. Displaced patellar fractures. No dislocation. 2. Moderate joint effusion. Electronically Signed   By: Anner Crete M.D.   On: 10/27/2019 19:30   DG Hand Complete Right  Result Date: 10/27/2019 CLINICAL DATA:  Status post trauma. EXAM: RIGHT HAND - COMPLETE 3+ VIEW COMPARISON:  None. FINDINGS: Acute fracture is seen involving the middle phalanx of the second right finger. There is no evidence of dislocation. A radiopaque ring (i.e. Jewelry) is seen overlying the proximal phalanx of the fourth right finger. A radiopaque pulse on motor is seen overlying the distal phalanx of the fourth right finger. There is no evidence of arthropathy or other focal bone abnormality. Soft tissues are unremarkable. IMPRESSION: Acute fracture of the middle phalanx of the second right finger. Electronically Signed   By: Virgina Norfolk M.D.   On: 10/27/2019 19:29    Procedures Procedures (including critical care time)  Medications Ordered in ED Medications  fentaNYL (SUBLIMAZE) injection 50 mcg (has no administration in time range)  morphine 4 MG/ML injection 4 mg (4 mg Intravenous Given 10/27/19 1836)  oxyCODONE-acetaminophen (PERCOCET/ROXICET) 5-325 MG per tablet 1 tablet (1 tablet Oral Given 10/27/19 2132)  ondansetron (ZOFRAN-ODT) disintegrating tablet 4 mg (4 mg Oral Given 10/27/19 2133)    ED Course  I have reviewed the triage vital signs and the nursing notes.  Pertinent labs & imaging results that were available during my care of the patient were reviewed by me and considered in my medical decision making (see chart for details).    MDM Rules/Calculators/A&P                          Patient presenting for evaluation of bilateral knee  pain and right hand pain after being knocked down by a wooden plank.  On exam, patient is neurovascularly intact.  She appears nontoxic.  She has no head or neck pain, I do not believe she  needs a CT of the head or neck at this time.  Will obtain x-rays of both knees and the right hand.  Morphine for pain.  X-rays viewed interpreted by me, shows fracture of the right patella and fracture of the middle phalanx of the index finger.  No fracture of the left knee.  Will consult with Ortho.  Discussed with Dr. Durward Fortes from Ortho care who recommends knee immobilization, crutches, and follow-up in the office.  Discussed findings and plan with patient, is agreeable.  Patient attempted to ambulate with crutches (remaining NWB on the R leg), and unable to do so due to pain.  Will give further pain control and reattempt.  After second time, patient still unable to ambulate.  As such, I do not believe she is able to go home.  She states she will not be able to go home safely.  The case discussed with attending, Dr. Laverta Baltimore evaluated the patient.  Will be consult with Ortho.  Discussed with Dr. Durward Fortes from orthopedics.  Renal consult on the patient in the morning, will admit to medicine for pain control.  Labs and Covid test ordered.  Pt signed out to Sharlene Motts, PA-C for admission.   Final Clinical Impression(s) / ED Diagnoses Final diagnoses:  Closed displaced comminuted fracture of right patella, initial encounter  Nondisplaced fracture of middle phalanx of right index finger, initial encounter for closed fracture  Fall, initial encounter    Rx / DC Orders ED Discharge Orders    None       Franchot Heidelberg, PA-C 10/27/19 2353    Margette Fast, MD 10/30/19 1400

## 2019-10-27 NOTE — ED Triage Notes (Signed)
Pt bib [facility] w/ c/o R knee pain after pt was caring for a horse. The horse got spooked, knocked into a fence, which hit the pt causing the pt to fall. Pt c/o 2/10 R knee pain, possible deformity noted by EMS. Pt denies LOC, AOx4, neuro intact. Pt denies head/neck pain, c-collar stabilization in place on arrival. EMS VSS.

## 2019-10-27 NOTE — Care Management (Signed)
ED CM consulted concerning DME needs light weight wheelchair. CM met with patient at bedside in rm34,discussed recommendations for wheelchair in which patient declined.  Stating that she does not have the room in her house to maneuver a wheel chair,  Updated the EDP.

## 2019-10-28 ENCOUNTER — Other Ambulatory Visit: Payer: Self-pay

## 2019-10-28 ENCOUNTER — Encounter (HOSPITAL_COMMUNITY): Payer: Self-pay | Admitting: Internal Medicine

## 2019-10-28 DIAGNOSIS — I10 Essential (primary) hypertension: Secondary | ICD-10-CM

## 2019-10-28 DIAGNOSIS — S82011A Displaced osteochondral fracture of right patella, initial encounter for closed fracture: Secondary | ICD-10-CM | POA: Diagnosis not present

## 2019-10-28 DIAGNOSIS — S62650A Nondisplaced fracture of medial phalanx of right index finger, initial encounter for closed fracture: Secondary | ICD-10-CM

## 2019-10-28 DIAGNOSIS — R52 Pain, unspecified: Secondary | ICD-10-CM | POA: Diagnosis present

## 2019-10-28 DIAGNOSIS — S82041A Displaced comminuted fracture of right patella, initial encounter for closed fracture: Secondary | ICD-10-CM

## 2019-10-28 DIAGNOSIS — Z20822 Contact with and (suspected) exposure to covid-19: Secondary | ICD-10-CM | POA: Diagnosis not present

## 2019-10-28 DIAGNOSIS — Z79899 Other long term (current) drug therapy: Secondary | ICD-10-CM | POA: Diagnosis not present

## 2019-10-28 DIAGNOSIS — S62609A Fracture of unspecified phalanx of unspecified finger, initial encounter for closed fracture: Secondary | ICD-10-CM | POA: Diagnosis present

## 2019-10-28 DIAGNOSIS — F329 Major depressive disorder, single episode, unspecified: Secondary | ICD-10-CM | POA: Diagnosis not present

## 2019-10-28 DIAGNOSIS — E785 Hyperlipidemia, unspecified: Secondary | ICD-10-CM

## 2019-10-28 DIAGNOSIS — S82091A Other fracture of right patella, initial encounter for closed fracture: Secondary | ICD-10-CM | POA: Diagnosis present

## 2019-10-28 DIAGNOSIS — Z8349 Family history of other endocrine, nutritional and metabolic diseases: Secondary | ICD-10-CM | POA: Diagnosis not present

## 2019-10-28 DIAGNOSIS — Z7982 Long term (current) use of aspirin: Secondary | ICD-10-CM | POA: Diagnosis not present

## 2019-10-28 DIAGNOSIS — Z7983 Long term (current) use of bisphosphonates: Secondary | ICD-10-CM | POA: Diagnosis not present

## 2019-10-28 DIAGNOSIS — Z8249 Family history of ischemic heart disease and other diseases of the circulatory system: Secondary | ICD-10-CM | POA: Diagnosis not present

## 2019-10-28 DIAGNOSIS — S82032G Displaced transverse fracture of left patella, subsequent encounter for closed fracture with delayed healing: Secondary | ICD-10-CM | POA: Diagnosis not present

## 2019-10-28 DIAGNOSIS — W5519XA Other contact with horse, initial encounter: Secondary | ICD-10-CM | POA: Diagnosis not present

## 2019-10-28 DIAGNOSIS — Z87442 Personal history of urinary calculi: Secondary | ICD-10-CM | POA: Diagnosis not present

## 2019-10-28 DIAGNOSIS — Z803 Family history of malignant neoplasm of breast: Secondary | ICD-10-CM | POA: Diagnosis not present

## 2019-10-28 LAB — CBC WITH DIFFERENTIAL/PLATELET
Abs Immature Granulocytes: 0.01 10*3/uL (ref 0.00–0.07)
Basophils Absolute: 0 10*3/uL (ref 0.0–0.1)
Basophils Relative: 0 %
Eosinophils Absolute: 0 10*3/uL (ref 0.0–0.5)
Eosinophils Relative: 0 %
HCT: 35.2 % — ABNORMAL LOW (ref 36.0–46.0)
Hemoglobin: 11.2 g/dL — ABNORMAL LOW (ref 12.0–15.0)
Immature Granulocytes: 0 %
Lymphocytes Relative: 23 %
Lymphs Abs: 1.5 10*3/uL (ref 0.7–4.0)
MCH: 31.1 pg (ref 26.0–34.0)
MCHC: 31.8 g/dL (ref 30.0–36.0)
MCV: 97.8 fL (ref 80.0–100.0)
Monocytes Absolute: 0.5 10*3/uL (ref 0.1–1.0)
Monocytes Relative: 8 %
Neutro Abs: 4.6 10*3/uL (ref 1.7–7.7)
Neutrophils Relative %: 69 %
Platelets: 208 10*3/uL (ref 150–400)
RBC: 3.6 MIL/uL — ABNORMAL LOW (ref 3.87–5.11)
RDW: 12.9 % (ref 11.5–15.5)
WBC: 6.7 10*3/uL (ref 4.0–10.5)
nRBC: 0 % (ref 0.0–0.2)

## 2019-10-28 LAB — BASIC METABOLIC PANEL
Anion gap: 10 (ref 5–15)
BUN: 20 mg/dL (ref 8–23)
CO2: 25 mmol/L (ref 22–32)
Calcium: 9 mg/dL (ref 8.9–10.3)
Chloride: 104 mmol/L (ref 98–111)
Creatinine, Ser: 0.8 mg/dL (ref 0.44–1.00)
GFR calc Af Amer: >60 mL/min (ref >60)
GFR calc non Af Amer: >60 mL/min (ref >60)
Glucose, Bld: 108 mg/dL — ABNORMAL HIGH (ref 70–99)
Potassium: 4.1 mmol/L (ref 3.5–5.1)
Sodium: 139 mmol/L (ref 135–145)

## 2019-10-28 LAB — SURGICAL PCR SCREEN
MRSA, PCR: NEGATIVE
Staphylococcus aureus: POSITIVE — AB

## 2019-10-28 LAB — MAGNESIUM: Magnesium: 2.3 mg/dL (ref 1.7–2.4)

## 2019-10-28 LAB — HIV ANTIBODY (ROUTINE TESTING W REFLEX): HIV Screen 4th Generation wRfx: NONREACTIVE

## 2019-10-28 LAB — SARS CORONAVIRUS 2 BY RT PCR (HOSPITAL ORDER, PERFORMED IN ~~LOC~~ HOSPITAL LAB): SARS Coronavirus 2: NEGATIVE

## 2019-10-28 MED ORDER — FLUOXETINE HCL 20 MG PO CAPS
20.0000 mg | ORAL_CAPSULE | Freq: Every day | ORAL | Status: DC
  Administered 2019-10-28 – 2019-11-06 (×9): 20 mg via ORAL
  Filled 2019-10-28 (×9): qty 1

## 2019-10-28 MED ORDER — ENOXAPARIN SODIUM 40 MG/0.4ML ~~LOC~~ SOLN
40.0000 mg | Freq: Every day | SUBCUTANEOUS | Status: DC
  Administered 2019-10-30 – 2019-11-06 (×8): 40 mg via SUBCUTANEOUS
  Filled 2019-10-28 (×8): qty 0.4

## 2019-10-28 MED ORDER — IBUPROFEN 800 MG PO TABS: 800.0000 mg | ORAL_TABLET | Freq: Four times a day (QID) | ORAL | Status: DC | PRN

## 2019-10-28 MED ORDER — ONDANSETRON HCL 4 MG PO TABS: 4.0000 mg | ORAL_TABLET | Freq: Four times a day (QID) | ORAL | Status: DC | PRN

## 2019-10-28 MED ORDER — ONDANSETRON HCL 4 MG/2ML IJ SOLN: 4.0000 mg | Freq: Four times a day (QID) | INTRAMUSCULAR | Status: DC | PRN

## 2019-10-28 MED ORDER — ASPIRIN EC 81 MG PO TBEC
81.0000 mg | DELAYED_RELEASE_TABLET | Freq: Every day | ORAL | Status: DC
  Administered 2019-10-28 – 2019-11-06 (×9): 81 mg via ORAL
  Filled 2019-10-28 (×9): qty 1

## 2019-10-28 MED ORDER — ROSUVASTATIN CALCIUM 20 MG PO TABS
20.0000 mg | ORAL_TABLET | Freq: Every day | ORAL | Status: DC
  Administered 2019-10-28 – 2019-11-06 (×9): 20 mg via ORAL
  Filled 2019-10-28 (×9): qty 1

## 2019-10-28 MED ORDER — MORPHINE SULFATE (PF) 4 MG/ML IV SOLN
4.0000 mg | INTRAVENOUS | Status: DC | PRN
  Filled 2019-10-28: qty 1

## 2019-10-28 MED ORDER — SODIUM CHLORIDE 0.9% FLUSH
3.0000 mL | Freq: Two times a day (BID) | INTRAVENOUS | Status: DC
  Administered 2019-10-28 – 2019-11-06 (×17): 3 mL via INTRAVENOUS

## 2019-10-28 MED ORDER — LISINOPRIL 10 MG PO TABS
10.0000 mg | ORAL_TABLET | Freq: Every day | ORAL | Status: DC
  Administered 2019-10-31 – 2019-11-05 (×6): 10 mg via ORAL
  Filled 2019-10-28 (×7): qty 1

## 2019-10-28 MED ORDER — OXYCODONE-ACETAMINOPHEN 5-325 MG PO TABS
1.0000 | ORAL_TABLET | ORAL | Status: DC | PRN
  Administered 2019-10-28 – 2019-11-06 (×24): 1 via ORAL
  Filled 2019-10-28 (×25): qty 1

## 2019-10-28 NOTE — Evaluation (Signed)
Occupational Therapy Evaluation Patient Details Name: April Castillo MRN: [id number] DOB: 10-23-1955 Today's Date: 10/28/2019    History of Present Illness Pt is a 64 year old woman admitted after a fall when a horse she was washing became spooked and broke a fence, which in turn hit her in the back of the head and knocked her forward onto her knees. Pt suffered a patellar fx on the R, mild effusion of the L knee and R first finger fx. PMH: R hip fx, HTN HLD, depression   Clinical Impression   Pt was independent prior to admission. Presents with B knee pain, R>L and poor sitting balance. She requires set up to total assist for ADL. Pt has the potential to reach a modified independent level with intensive rehab. Recommending CIR. Will follow acutely.    Follow Up Recommendations  CIR    Equipment Recommendations  None recommended by OT    Recommendations for Other Services Rehab consult     Precautions / Restrictions Precautions Precautions: Fall Required Braces or Orthoses: Knee Immobilizer - Right Knee Immobilizer - Right: On at all times Restrictions Weight Bearing Restrictions: Yes RLE Weight Bearing: Non weight bearing      Mobility Bed Mobility Overal bed mobility: Needs Assistance Bed Mobility: Supine to Sit;Sit to Supine     Supine to sit: Min assist Sit to supine: Mod assist   General bed mobility comments: assisted R LE over EOB and B LEs back into bed, HOB up, use of rail, increased time   Transfers Overall transfer level: Needs assistance Equipment used: Rolling walker (2 wheeled) Transfers: Sit to/from Stand Sit to Stand: Mod assist         General transfer comment: cues for hand placement, assist to rise and steady, pt unable to attempt transfer or side steps    Balance Overall balance assessment: Needs assistance   Sitting balance-Leahy Scale: Good       Standing balance-Leahy Scale: Poor                             ADL either  performed or assessed with clinical judgement   ADL Overall ADL's : Needs assistance/impaired Eating/Feeding: Independent   Grooming: Set up;Sitting   Upper Body Bathing: Set up;Sitting   Lower Body Bathing: Total assistance;Sit to/from stand   Upper Body Dressing : Set up;Sitting   Lower Body Dressing: Total assistance;Sit to/from stand       Toileting- Water quality scientist and Hygiene: Total assistance;Sit to/from stand;+2 for safety/equipment               Vision Baseline Vision/History: Wears glasses Wears Glasses: At all times Patient Visual Report: No change from baseline       Perception     Praxis      Pertinent Vitals/Pain Pain Assessment: Faces Faces Pain Scale: Hurts even more Pain Location: R knee Pain Descriptors / Indicators: Guarding;Grimacing;Sore Pain Intervention(s): Repositioned;Monitored during session;Premedicated before session;Limited activity within patient's tolerance;Ice applied     Hand Dominance Right   Extremity/Trunk Assessment Upper Extremity Assessment Upper Extremity Assessment: RUE deficits/detail RUE Deficits / Details: first finger fx--in splint RUE Coordination: decreased fine motor   Lower Extremity Assessment Lower Extremity Assessment: Defer to PT evaluation   Cervical / Trunk Assessment Cervical / Trunk Assessment: Normal   Communication Communication Communication: No difficulties   Cognition Arousal/Alertness: Awake/alert Behavior During Therapy: WFL for tasks assessed/performed Overall Cognitive Status: Within Functional Limits for  tasks assessed                                     General Comments       Exercises     Shoulder Instructions      Home Living Family/patient expects to be discharged to:: Private residence Living Arrangements: Alone Available Help at Discharge: Family;Available PRN/intermittently Type of Home: House Home Access: Stairs to enter [employer]  of Steps: 8 Entrance Stairs-Rails: Right;Left Home Layout: One level     Bathroom Shower/Tub: Teacher, early years/pre: Standard     Home Equipment: Bedside commode;Walker - 2 wheels;Tub bench   Additional Comments: sister will check to make sure pt still has DME      Prior Functioning/Environment Level of Independence: Independent                 OT Problem List: Decreased strength;Impaired balance (sitting and/or standing);Decreased knowledge of use of DME or AE;Pain;Impaired UE functional use      OT Treatment/Interventions: Self-care/ADL training;DME and/or AE instruction;Patient/family education;Balance training    OT Goals(Current goals can be found in the care plan section) Acute Rehab OT Goals Patient Stated Goal: return home independent enough for intermittent assist of her sister OT Goal Formulation: With patient Time For Goal Achievement: 11/11/19 Potential to Achieve Goals: Good ADL Goals Pt Will Perform Grooming: with modified independence;sitting Pt Will Perform Lower Body Bathing: with modified independence;with adaptive equipment;sit to/from stand Pt Will Perform Lower Body Dressing: with modified independence;sit to/from stand;with adaptive equipment Pt Will Transfer to Toilet: with modified independence;ambulating;bedside commode Pt Will Perform Toileting - Clothing Manipulation and hygiene: with modified independence;sit to/from stand Pt Will Perform Tub/Shower Transfer: Tub transfer;with supervision;ambulating;tub bench;rolling walker Additional ADL Goal #1: Pt will perform bed mobility modified independently with leg lifter as needed.  OT Frequency: Min 2X/week   Barriers to D/C:            Co-evaluation              AM-PAC OT "6 Clicks" Daily Activity     Outcome Measure Help from another person eating meals?: None Help from another person taking care of personal grooming?: A Little Help from another person toileting, which  includes using toliet, bedpan, or urinal?: Total Help from another person bathing (including washing, rinsing, drying)?: A Lot Help from another person to put on and taking off regular upper body clothing?: A Little Help from another person to put on and taking off regular lower body clothing?: Total 6 Click Score: 14   End of Session Equipment Utilized During Treatment: Gait belt;Rolling walker;Right knee immobilizer  Activity Tolerance: Patient limited by pain Patient left: in bed;with call bell/phone within reach;with family/visitor present;with bed alarm set  OT Visit Diagnosis: Unsteadiness on feet (R26.81);Other abnormalities of gait and mobility (R26.89);Pain;Muscle weakness (generalized) (M62.81)                Time: 1950-9326 OT Time Calculation (min): 33 min Charges:  OT General Charges $OT Visit: 1 Visit OT Evaluation $OT Eval Moderate Complexity: 1 Mod OT Treatments $Self Care/Home Management : 8-22 mins  Nestor Lewandowsky, OTR/L Acute Rehabilitation Services Pager: [phone] Office: [phone]  Malka So 10/28/2019, 3:56 PM

## 2019-10-28 NOTE — H&P (Signed)
History and Physical    PLEASE NOTE THAT DRAGON DICTATION SOFTWARE WAS USED IN THE CONSTRUCTION OF THIS NOTE.   April Castillo [id number]:[id number] DOB: 01-02-1956 DOA: 10/27/2019  PCP: Gifford Shave, MD Patient coming from: home     I have personally briefly reviewed patient's old medical records in [place]  Chief Complaint: knee pain  HPI: April Castillo is a 64 y.o. female with medical history significant for HTN, HLD, who is admitted to [facility] on 10/27/2019 with acute displaced right patellar fracture after presenting from home to [facility] Emergency Department complaining of right knee pain.   The patient reports that she was cleaning her horses stall at home, when something startled the horse, prompted horse to start moving erratically and frantically. In the process the patient reports that she was knocked over from a standing position on the floor of the barn, cause her to fall to the ground with her right knee serving as the principal poor contact with the barn floor, closely followed by hitting her left knee also on the floor the morning.  At that point she reports the horse stepped on her right index finger before the animal excited the barn.  Involving to the floor, the patient reports that she did not hit her head on the ground, and denies any loss of consciousness associated with the above mechanism of injury.  She reports immediate development of bilateral knee discomfort, with right knee pain significantly more intense relative to the left counterpart.  She also reports mild discomfort associated with the right index finger.  She denies any numbness or paresthesias associated with any of her extremities.  Denies any dizziness, vertigo, nausea, vomiting, or headache.  Denies any associated neck discomfort.  In the setting of her inability to ambulate on the basis of poor pain control, she was able to contact EMS, who subsequently brought the patient to [facility]  emergency department for further evaluation.  At baseline, she lives independently, and is able to ambulate without assistance.  She is on a daily baby aspirin, with most recent such dose occurring on the morning of 10/27/2019.  Otherwise she denies any use of antiplatelet or anticoagulant medications at home.  Denies any subjective fever, chills, rigors, or generalized myalgias. Denies any recent rhinitis, rhinorrhea, sore throat, sob, cough, nausea, vomiting, abdominal pain, diarrhea, or rash. No recent traveling or known COVID-19 exposures. Denies dysuria, gross hematuria, or change in urinary urgency/frequency.  Denies any recent chest pain, diaphoresis, or palpitations.    ED Course:   Labs were notable for the following: BMP notable for the following: Sodium 137, bicarbonate 23, creatinine 0.78.  CBC notable for white blood cell count of 10,900, hemoglobin 11.6, and platelets 217.  Routine screening nasopharyngeal COVID-19 PCR performed in the ED this evening was found to be negative.  Plain films of the right knee demonstrated evidence of a displaced patellar fracture with out dislocation, but was associated with moderate joint effusion.  Plain films of the left knee showed no evidence of acute fracture or dislocation but showed mild joint effusion.  Plain films of the right hand showed an acute fracture of the middle phalanx of the second right finger.  The emergency department physician discussed the patient's case and imaging with the on-call orthopedic surgeon, Dr. Marlou Sa, who recommended overnight observation to the hospitalist service for improved pain control, with plan for Dr. Marlou Sa to evaluate the patient in the morning.   While in  the ED, the following were administered: Morphine 4 mg IV x1 and Percocet 5/325 mg p.o. x1.    Review of Systems: As per HPI otherwise 10 point review of systems negative.   Past Medical History:  Diagnosis Date  . Depression   . Heart murmur   . Hip  fracture, right ([place]) 11/23/2010  . Hypertension   . Kidney stone   . KNEE PAIN, LEFT 11/07/2009  . Other and unspecified hyperlipidemia 01/01/2013  . PERIMENOPAUSAL SYNDROME 11/07/2009  . Rosacea 06/20/2006  . Tension headache 06/20/2006       . UTERINE FIBROID 06/20/2006    Past Surgical History:  Procedure Laterality Date  . CATARACT EXTRACTION W/ INTRAOCULAR LENS IMPLANT Left 08/03/13   Dr. Katy Fitch @ [facility] [place]  . EYE SURGERY Right    Cataract  . r hip fracture and repair  January 24, 2010    Social History:  reports that she has never smoked. She has never used smokeless tobacco. She reports current alcohol use of about 3.0 standard drinks of alcohol per week. She reports that she does not use drugs.   No Known Allergies  Family History  Problem Relation Age of Onset  . Cancer Father   . Hypertension Father   . Cancer Sister   . Thyroid disease Sister   . Breast cancer Sister   . Cancer Maternal Uncle   . Diabetes Neg Hx   . Heart disease Neg Hx      Prior to Admission medications   Medication Sig Start Date End Date Taking? Authorizing Provider  alendronate (FOSAMAX) 70 MG tablet Take 70 mg by mouth once a week. Take with a full glass of water on an empty stomach.   Yes [provider]  aspirin EC 81 MG tablet Take 81 mg by mouth daily. Swallow whole.   Yes [provider]  FLUoxetine (PROZAC) 20 MG capsule Take 1 capsule (20 mg total) by mouth daily. 01/15/18  Yes Bland, Scott, DO  ibuprofen (ADVIL,MOTRIN) 800 MG tablet Take 800 mg by mouth every 8 (eight) hours as needed. for low back pain   Yes [provider]  lisinopril (PRINIVIL,ZESTRIL) 10 MG tablet TAKE 1 TABLET DAILY Patient taking differently: Take 10 mg by mouth daily.  10/03/17  Yes Nicolette Bang, DO  rosuvastatin (CRESTOR) 20 MG tablet Take 20 mg by mouth daily.   Yes [provider]  fluticasone (FLONASE) 50 MCG/ACT nasal spray Place 2 sprays into both nostrils  daily. Patient not taking: Reported on 10/27/2019 02/10/16   Nicolette Bang, DO  olopatadine (PATANOL) 0.1 % ophthalmic solution Place 1 drop into both eyes 2 (two) times daily. Patient not taking: Reported on 10/27/2019 11/07/17   Zenia Resides, MD     Objective    Physical Exam: Vitals:   10/27/19 1721 10/27/19 2131  BP: (!) 141/75 121/70  Pulse: 98 93  Resp: 16 19  Temp: 99 F (37.2 C)   TempSrc: Oral   SpO2: 95% 93%  Weight: 72.6 kg   Height: 5\' 6"  (1.676 m)     General: appears to be stated age; alert, oriented Skin: warm, dry; bruising a/w right 2nd finger. Head:  AT/[place] Eyes:  PEARL b/l, EOMI Mouth:  Oral mucosa membranes appear moist, normal dentition Neck: supple; trachea midline Heart:  RRR; did not appreciate any M/R/G Lungs: CTAB, did not appreciate any wheezes, rales, or rhonchi Abdomen: + BS; soft, ND, NT Vascular: 2+ pedal pulses b/l; 2+  radial pulses b/l Extremities: gross swelling a/w bilateral knees, right greater than left; no muscle wasting; splint noted to be associated right 2nd finger. Neuro: sensation intact in upper and lower extremities b/l    Labs on Admission: I have personally reviewed following labs and imaging studies  CBC: Recent Labs  Lab 10/27/19 2246  WBC 10.9*  HGB 11.6*  HCT 36.3  MCV 97.3  PLT 741   Basic Metabolic Panel: Recent Labs  Lab 10/27/19 2246  NA 141  K 4.2  CL 107  CO2 23  GLUCOSE 118*  BUN 20  CREATININE 0.78  CALCIUM 9.2   GFR: Estimated Creatinine Clearance: 71.5 mL/min (by C-G formula based on SCr of 0.78 mg/dL). Liver Function Tests: No results for input(s): AST, ALT, ALKPHOS, BILITOT, PROT, ALBUMIN in the last 168 hours. No results for input(s): LIPASE, AMYLASE in the last 168 hours. No results for input(s): AMMONIA in the last 168 hours. Coagulation Profile: No results for input(s): INR, PROTIME in the last 168 hours. Cardiac Enzymes: No results for input(s): CKTOTAL, CKMB,  CKMBINDEX, TROPONINI in the last 168 hours. BNP (last 3 results) No results for input(s): PROBNP in the last 8760 hours. HbA1C: No results for input(s): HGBA1C in the last 72 hours. CBG: No results for input(s): GLUCAP in the last 168 hours. Lipid Profile: No results for input(s): CHOL, HDL, LDLCALC, TRIG, CHOLHDL, LDLDIRECT in the last 72 hours. Thyroid Function Tests: No results for input(s): TSH, T4TOTAL, FREET4, T3FREE, THYROIDAB in the last 72 hours. Anemia Panel: No results for input(s): VITAMINB12, FOLATE, FERRITIN, TIBC, IRON, RETICCTPCT in the last 72 hours. Urine analysis:    Component Value Date/Time   COLORURINE YELLOW 03/03/2009 2310   APPEARANCEUR CLOUDY (A) 03/03/2009 2310   LABSPEC 1.017 03/03/2009 2310   PHURINE 7.5 03/03/2009 2310   GLUCOSEU NEGATIVE 03/03/2009 2310   HGBUR NEGATIVE 03/03/2009 2310   BILIRUBINUR NEGATIVE 03/03/2009 2310   KETONESUR NEGATIVE 03/03/2009 2310   PROTEINUR NEGATIVE 03/03/2009 2310   UROBILINOGEN 0.2 03/03/2009 2310   NITRITE NEGATIVE 03/03/2009 2310   LEUKOCYTESUR  03/03/2009 2310    NEGATIVE MICROSCOPIC NOT DONE ON URINES WITH NEGATIVE PROTEIN, BLOOD, LEUKOCYTES, NITRITE, OR GLUCOSE <1000 mg/dL.    Radiological Exams on Admission: DG Knee Complete 4 Views Left  Result Date: 10/27/2019 CLINICAL DATA:  Status post trauma. EXAM: LEFT KNEE - COMPLETE 4+ VIEW COMPARISON:  None. FINDINGS: No evidence of acute fracture or dislocation. No evidence of arthropathy or other focal bone abnormality. There is a small joint effusion. IMPRESSION: No acute fracture or dislocation. Small joint effusion. Electronically Signed   By: Virgina Norfolk M.D.   On: 10/27/2019 19:31   DG Knee Complete 4 Views Right  Result Date: 10/27/2019 CLINICAL DATA:  64 year old female with fall bilateral knee pain. EXAM: RIGHT KNEE - COMPLETE 4+ VIEW COMPARISON:  Left knee radiograph dated 10/27/2019. FINDINGS: There is a comminuted fracture of the patella with 3 main  displaced fracture fragments. There is no dislocation. The bones are osteopenic. There is a moderate joint effusion. The soft tissue swelling of the anterior knee. No radiopaque foreign object or soft tissue gas. IMPRESSION: 1. Displaced patellar fractures. No dislocation. 2. Moderate joint effusion. Electronically Signed   By: Anner Crete M.D.   On: 10/27/2019 19:30   DG Hand Complete Right  Result Date: 10/27/2019 CLINICAL DATA:  Status post trauma. EXAM: RIGHT HAND - COMPLETE 3+ VIEW COMPARISON:  None. FINDINGS: Acute fracture is seen involving the middle phalanx of  the second right finger. There is no evidence of dislocation. A radiopaque ring (i.e. Jewelry) is seen overlying the proximal phalanx of the fourth right finger. A radiopaque pulse on motor is seen overlying the distal phalanx of the fourth right finger. There is no evidence of arthropathy or other focal bone abnormality. Soft tissues are unremarkable. IMPRESSION: Acute fracture of the middle phalanx of the second right finger. Electronically Signed   By: Virgina Norfolk M.D.   On: 10/27/2019 19:29     Assessment/Plan   April Castillo is a 64 y.o. female with medical history significant for HTN, HLD, who is admitted to [facility] on 10/27/2019 with acute displaced right patellar fracture after presenting from home to [facility] Emergency Department complaining of right knee pain.    Principal Problem:   Closed patellar sleeve fracture of right knee Active Problems:   Depression   Essential hypertension   Hyperlipidemia   Finger fracture, right    #) Acute displaced right patellar fracture: Acute right knee discomfort, following trauma involving horse earlier in the day, as further described above, with plain films of the right knee showing evidence of an acute displaced right patellar fracture with associated moderate joint effusion, in the absence of any associated dislocation.  The right lower extremity  appears to be neurovascularly intact, although the patient is currently unable to ambulate due to poor pain control associated with the right knee discomfort.  Case was discussed with the on-call orthopedic physician, who recommended overnight obs to the hospitalist service, with plan for Dr. Marlou Sa to see the patient in the morning.   Plan: As needed Percocet.  As needed ice.  Orthopedic surgery consult, with plan for Dr. Marlou Sa to evaluate the patient in person in the morning. PT/OT consults placed for the morning.      #) Acute fracture of middle phalanx of right 2nd finger: Right upper extremity appears neurovascularly intact.   Plan: As needed Percocet.  Nursing communication order placed requesting that right upper extremity be elevated with pillows in order to promote gravitational drainage o swelling associated with this injury.       #) Essential hypertension: Outpatient antihypertensive regimen consists of lisinopril.  Blood pressure normotensive since presentation.  Plan: Continue home lisinopril.  Monitoring of ensuing blood pressure via routine vital signs.     #) Hyperlipidemia: On rosuvastatin as an outpatient.  Plan: Continue home statin.     #) Depression: On fluoxetine as an outpatient.  Plan: Continue home SSRI.    DVT prophylaxis: Lovenox 40 mg sq qdaily  Code Status: Full code Family Communication: none Disposition Plan: Per Rounding Team Consults called: case was discussed with the on-call orthopedic surgeon, Dr. Marlou Sa, as further described above. Admission status: Observation; MedSurg.    PLEASE NOTE THAT DRAGON DICTATION SOFTWARE WAS USED IN THE CONSTRUCTION OF THIS NOTE.   Rhetta Mura DO [facility] Pager [phone] From [place]   10/28/2019, 12:08 AM

## 2019-10-28 NOTE — Progress Notes (Signed)
PROGRESS NOTE    April Castillo  JOA:[id number] DOB: [birth year] DOA: 10/27/2019 PCP: Gifford Shave, MD   Brief Narrative:  April Castillo is a 64 y.o. female with medical history significant for HTN, HLD, who is admitted to [facility] on 10/27/2019 with acute displaced right patellar fracture after presenting from home to [facility] Emergency Department complaining of right knee pain. The patient reports that she was cleaning her horses stall at home, when something startled the horse, prompted horse to start moving erratically and frantically. In the process the patient reports that she was knocked over from a standing position on the floor of the barn, cause her to fall to the ground with her right knee serving as the principal poor contact with the barn floor, closely followed by hitting her left knee also on the floor the morning.  At that point she reports the horse stepped on her right index finger before the animal exited the barn. She reports immediate development of bilateral knee discomfort, with right knee pain significantly more intense relative to the left counterpart.  She also reports mild discomfort associated with the right index finger.  She denies any numbness or paresthesias associated with any of her extremities. Presented April Castillo emergency department for further evaluation.  At baseline, she lives independently, and is able to ambulate without assistance. Plain films of the right knee demonstrated evidence of a displaced patellar fracture with out dislocation, but was associated with moderate joint effusion.  Plain films of the left knee showed no evidence of acute fracture or dislocation but showed mild joint effusion.  Plain films of the right hand showed an acute fracture of the middle phalanx of the second right finger. The emergency department physician discussed the patient's case and imaging with the on-call orthopedic surgeon, Dr. Marlou Sa, who recommended  overnight observation to the hospitalist service for improved pain control, with plan for Dr. Marlou Sa to evaluate the patient in the morning.   Assessment & Plan:   Principal Problem:   Closed patellar sleeve fracture of right knee Active Problems:   Depression   Essential hypertension   Hyperlipidemia   Finger fracture, right   Acute displaced right patellar fracture due to mechanical fall/trauma, POA:  Imaging shows right knee showing evidence of an acute displaced right patellar fracture with associated moderate joint effusion Orthopedics following for surgical repair PT/OT to follow Patient hopefull for disposition home pending postoperative pain and ambulation. Pain currently well controlled on Percocet, morphine  Acute fracture of middle phalanx of right 2nd finger:  Right upper extremity appears neurovascularly intact.  Pain well controlled, nonoperative per orthopedics, currently in splint  Essential hypertension:  Currently well controlled, resume home lisinopril  Hyperlipidemia:  Continue home rosuvastatin  Depression: Continue home fluoxetine   DVT prophylaxis: Lovenox 40 mg sq qdaily  Code Status: Full code Family Communication: None present  Status is: Inpatient  Dispo: The patient is from: Home              Anticipated d/c is to: Home              Anticipated d/c date is: Likely 24 to 48 hours              Patient currently not medically stable for discharge due to ongoing need for IV narcotics, surgical repair of fracture and postoperative care.  Consultants:   Orthopedic surgery  Procedures:   ORIF of left patella  Antimicrobials:  Perioperatively  Subjective: No acute issues or events overnight, patient's pain currently well controlled, denies nausea, vomiting, diarrhea, constipation, headache, fevers, chills, shortness of breath, chest pain.  Objective: Vitals:   10/27/19 2131 10/28/19 0018 10/28/19 0028 10/28/19 0255  BP: 121/70   115/67 120/65  Pulse: 93 78 86 75  Resp: 19  18 16   Temp:    98.5 F (36.9 C)  TempSrc:    Oral  SpO2: 93% 94% 96% 95%  Weight:      Height:       No intake or output data in the 24 hours ending 10/28/19 0741 Filed Weights   10/27/19 1721  Weight: 72.6 kg    Examination:  General:  Pleasantly resting in bed, No acute distress. HEENT:  Normocephalic atraumatic.  Sclerae nonicteric, noninjected.  Extraocular movements intact bilaterally. Neck:  Without mass or deformity.  Trachea is midline. Lungs:  Clear to auscultate bilaterally without rhonchi, wheeze, or rales. Heart:  Regular rate and rhythm.  Without murmurs, rubs, or gallops. Abdomen:  Soft, nontender, nondistended.  Without guarding or rebound. Extremities: Left knee swollen with nonblanching erythema proximally and distally; right first finger ecchymotic past the proximal phalanx in a splint. Otherwise without cyanosis, clubbing, edema, or obvious deformity. Vascular:  Dorsalis pedis and posterior tibial pulses palpable bilaterally. Skin:  Warm and dry, no erythema, no ulcerations.   Data Reviewed: I have personally reviewed following labs and imaging studies  CBC: Recent Labs  Lab 10/27/19 2246 10/28/19 0423  WBC 10.9* 6.7  NEUTROABS  --  4.6  HGB 11.6* 11.2*  HCT 36.3 35.2*  MCV 97.3 97.8  PLT 217 585   Basic Metabolic Panel: Recent Labs  Lab 10/27/19 2246 10/28/19 0423  NA 141 139  K 4.2 4.1  CL 107 104  CO2 23 25  GLUCOSE 118* 108*  BUN 20 20  CREATININE 0.78 0.80  CALCIUM 9.2 9.0  MG  --  2.3   GFR: Estimated Creatinine Clearance: 71.5 mL/min (by C-G formula based on SCr of 0.8 mg/dL). Liver Function Tests: No results for input(s): AST, ALT, ALKPHOS, BILITOT, PROT, ALBUMIN in the last 168 hours. No results for input(s): LIPASE, AMYLASE in the last 168 hours. No results for input(s): AMMONIA in the last 168 hours. Coagulation Profile: No results for input(s): INR, PROTIME in the last 168  hours. Cardiac Enzymes: No results for input(s): CKTOTAL, CKMB, CKMBINDEX, TROPONINI in the last 168 hours. BNP (last 3 results) No results for input(s): PROBNP in the last 8760 hours. HbA1C: No results for input(s): HGBA1C in the last 72 hours. CBG: No results for input(s): GLUCAP in the last 168 hours. Lipid Profile: No results for input(s): CHOL, HDL, LDLCALC, TRIG, CHOLHDL, LDLDIRECT in the last 72 hours. Thyroid Function Tests: No results for input(s): TSH, T4TOTAL, FREET4, T3FREE, THYROIDAB in the last 72 hours. Anemia Panel: No results for input(s): VITAMINB12, FOLATE, FERRITIN, TIBC, IRON, RETICCTPCT in the last 72 hours. Sepsis Labs: No results for input(s): PROCALCITON, LATICACIDVEN in the last 168 hours.  Recent Results (from the past 240 hour(s))  SARS Coronavirus 2 by RT PCR (hospital order, performed in [facility] hospital lab) Nasopharyngeal Nasopharyngeal Swab     Status: None   Collection Time: 10/27/19 11:07 PM   Specimen: Nasopharyngeal Swab  Result Value Ref Range Status   SARS Coronavirus 2 NEGATIVE NEGATIVE Final    Comment: (NOTE) SARS-CoV-2 target nucleic acids are NOT DETECTED.  The SARS-CoV-2 RNA is generally detectable in upper and lower respiratory specimens  during the acute phase of infection. The lowest concentration of SARS-CoV-2 viral copies this assay can detect is 250 copies / mL. A negative result does not preclude SARS-CoV-2 infection and should not be used as the sole basis for treatment or other patient management decisions.  A negative result may occur with improper specimen collection / handling, submission of specimen other than nasopharyngeal swab, presence of viral mutation(s) within the areas targeted by this assay, and inadequate number of viral copies (<250 copies / mL). A negative result must be combined with clinical observations, patient history, and epidemiological information.  Fact Sheet for Patients:     StrictlyIdeas.no  Fact Sheet for Healthcare Providers: BankingDealers.co.za  This test is not yet approved or  cleared by the [place] FDA and has been authorized for detection and/or diagnosis of SARS-CoV-2 by FDA under an Emergency Use Authorization (EUA).  This EUA will remain in effect (meaning this test can be used) for the duration of the COVID-19 declaration under Section 564(b)(1) of the Act, 21 U.S.C. section 360bbb-3(b)(1), unless the authorization is terminated or revoked sooner.  Performed at [facility], [place] [address], [place], [place] [address]   Surgical pcr screen     Status: Abnormal   Collection Time: 10/28/19  3:15 AM   Specimen: Nasal Mucosa; Nasal Swab  Result Value Ref Range Status   MRSA, PCR NEGATIVE NEGATIVE Final   Staphylococcus aureus POSITIVE (A) NEGATIVE Final    Comment: (NOTE) The Xpert SA Assay (FDA approved for NASAL specimens in patients 70 years of age and older), is one component of a comprehensive surveillance program. It is not intended to diagnose infection nor to guide or monitor treatment. Performed at [facility], [place] [address], [place], [place] [address]          Radiology Studies: DG Knee Complete 4 Views Left  Result Date: 10/27/2019 CLINICAL DATA:  Status post trauma. EXAM: LEFT KNEE - COMPLETE 4+ VIEW COMPARISON:  None. FINDINGS: No evidence of acute fracture or dislocation. No evidence of arthropathy or other focal bone abnormality. There is a small joint effusion. IMPRESSION: No acute fracture or dislocation. Small joint effusion. Electronically Signed   By: Virgina Norfolk M.D.   On: 10/27/2019 19:31   DG Knee Complete 4 Views Right  Result Date: 10/27/2019 CLINICAL DATA:  64 year old female with fall bilateral knee pain. EXAM: RIGHT KNEE - COMPLETE 4+ VIEW COMPARISON:  Left knee radiograph dated 10/27/2019. FINDINGS: There is a comminuted fracture of the  patella with 3 main displaced fracture fragments. There is no dislocation. The bones are osteopenic. There is a moderate joint effusion. The soft tissue swelling of the anterior knee. No radiopaque foreign object or soft tissue gas. IMPRESSION: 1. Displaced patellar fractures. No dislocation. 2. Moderate joint effusion. Electronically Signed   By: Anner Crete M.D.   On: 10/27/2019 19:30   DG Hand Complete Right  Result Date: 10/27/2019 CLINICAL DATA:  Status post trauma. EXAM: RIGHT HAND - COMPLETE 3+ VIEW COMPARISON:  None. FINDINGS: Acute fracture is seen involving the middle phalanx of the second right finger. There is no evidence of dislocation. A radiopaque ring (i.e. Jewelry) is seen overlying the proximal phalanx of the fourth right finger. A radiopaque pulse on motor is seen overlying the distal phalanx of the fourth right finger. There is no evidence of arthropathy or other focal bone abnormality. Soft tissues are unremarkable. IMPRESSION: Acute fracture of the middle phalanx of the second right finger.  Electronically Signed   By: Virgina Norfolk M.D.   On: 10/27/2019 19:29        Scheduled Meds: . aspirin EC  81 mg Oral Daily  . [START ON 10/29/2019] enoxaparin (LOVENOX) injection  40 mg Subcutaneous Daily  . FLUoxetine  20 mg Oral Daily  . lisinopril  10 mg Oral Daily  . rosuvastatin  20 mg Oral Daily  . sodium chloride flush  3 mL Intravenous Q12H   Continuous Infusions:   LOS: 0 days   Time spent: 80min  Adoria Kawamoto C Deneen Slager, DO [facility]  If 7PM-7AM, please contact night-coverage [email or web address]  10/28/2019, 7:42 AM

## 2019-10-28 NOTE — Consult Note (Addendum)
ORTHOPAEDIC CONSULTATION  REQUESTING PHYSICIAN: Little Ishikawa, MD  Chief Complaint: "My right knee hurts"  HPI: April Castillo is a 64 y.o. female who presents with bilateral knee pain following fall yesterday.  She was in a barn near a horse when the horse got spooked causing her to get hit by a 2 x 4 and fell to the ground onto her knees.  The horse also stepped on her right index finger.  She complains primarily of right knee pain and swelling.  She is unable to bear weight on the right knee.  She denies any history of right knee surgery.  She denies any history of blood clots.  She does not take any blood thinners.  She denies any history of diabetes or smoking.  Denies loss of consciousness, abdominal pain, groin pain.  She is retired.  Past Medical History:  Diagnosis Date  . Depression   . Heart murmur   . Hip fracture, right ([place]) 11/23/2010  . Hypertension   . Kidney stone   . KNEE PAIN, LEFT 11/07/2009  . Other and unspecified hyperlipidemia 01/01/2013  . PERIMENOPAUSAL SYNDROME 11/07/2009  . Rosacea 06/20/2006  . Tension headache 06/20/2006       . UTERINE FIBROID 06/20/2006   Past Surgical History:  Procedure Laterality Date  . CATARACT EXTRACTION W/ INTRAOCULAR LENS IMPLANT Left 08/03/13   Dr. Katy Fitch @ [facility]  . EYE SURGERY Right    Cataract  . r hip fracture and repair  January 24, 2010   Social History   Socioeconomic History  . Marital status: Single    Spouse name: Not on file  . Number of children: Not on file  . Years of education: Not on file  . Highest education level: Not on file  Occupational History  . 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 Smoking status: Never Smoker  . Smokeless tobacco: Never Used  Substance and Sexual Activity  . Alcohol use: Yes    Alcohol/week: 3.0 standard drinks    Types: 1 Glasses of wine, 2 Cans of beer per week    Comment: few drinks a week  . Drug use: No  . Sexual activity: Yes    Birth  control/protection: None  Other Topics Concern  . Not on file  Social History Narrative   In relationship with female partner who lives in [place].  Works as travel Music therapist, job has become less stressful.     Social Determinants of Health   Financial Resource Strain:   . Difficulty of Paying Living Expenses:   Food Insecurity:   . Worried About Charity fundraiser in the Last Year:   . Arboriculturist in the Last Year:   Transportation Needs:   . Film/video editor (Medical):   Marland Kitchen Lack of Transportation (Non-Medical):   Physical Activity:   . Days of Exercise per Week:   . Minutes of Exercise per Session:   Stress:   . Feeling of Stress :   Social Connections:   . Frequency of Communication with Friends and Family:   . Frequency of Social Gatherings with Friends and Family:   . Attends Religious Services:   . Active Member of Clubs or Organizations:   . Attends Archivist Meetings:   Marland Kitchen Marital Status:    Family History  Problem Relation Age of Onset  . Cancer Father   . Hypertension Father   . Cancer Sister   .  Thyroid disease Sister   . Breast cancer Sister   . Cancer Maternal Uncle   . Diabetes Neg Hx   . Heart disease Neg Hx    - negative except otherwise stated in the family history section No Known Allergies Prior to Admission medications   Medication Sig Start Date End Date Taking? Authorizing Provider  alendronate (FOSAMAX) 70 MG tablet Take 70 mg by mouth once a week. Take with a full glass of water on an empty stomach.   Yes [provider]  aspirin EC 81 MG tablet Take 81 mg by mouth daily. Swallow whole.   Yes [provider]  FLUoxetine (PROZAC) 20 MG capsule Take 1 capsule (20 mg total) by mouth daily. 01/15/18  Yes Bland, Scott, DO  ibuprofen (ADVIL,MOTRIN) 800 MG tablet Take 800 mg by mouth every 8 (eight) hours as needed. for low back pain   Yes [provider]  lisinopril (PRINIVIL,ZESTRIL) 10 MG tablet TAKE 1  TABLET DAILY Patient taking differently: Take 10 mg by mouth daily.  10/03/17  Yes Nicolette Bang, DO  rosuvastatin (CRESTOR) 20 MG tablet Take 20 mg by mouth daily.   Yes [provider]  fluticasone (FLONASE) 50 MCG/ACT nasal spray Place 2 sprays into both nostrils daily. Patient not taking: Reported on 10/27/2019 02/10/16   Nicolette Bang, DO  olopatadine (PATANOL) 0.1 % ophthalmic solution Place 1 drop into both eyes 2 (two) times daily. Patient not taking: Reported on 10/27/2019 11/07/17   Zenia Resides, MD   DG Knee Complete 4 Views Left  Result Date: 10/27/2019 CLINICAL DATA:  Status post trauma. EXAM: LEFT KNEE - COMPLETE 4+ VIEW COMPARISON:  None. FINDINGS: No evidence of acute fracture or dislocation. No evidence of arthropathy or other focal bone abnormality. There is a small joint effusion. IMPRESSION: No acute fracture or dislocation. Small joint effusion. Electronically Signed   By: Virgina Norfolk M.D.   On: 10/27/2019 19:31   DG Knee Complete 4 Views Right  Result Date: 10/27/2019 CLINICAL DATA:  64 year old female with fall bilateral knee pain. EXAM: RIGHT KNEE - COMPLETE 4+ VIEW COMPARISON:  Left knee radiograph dated 10/27/2019. FINDINGS: There is a comminuted fracture of the patella with 3 main displaced fracture fragments. There is no dislocation. The bones are osteopenic. There is a moderate joint effusion. The soft tissue swelling of the anterior knee. No radiopaque foreign object or soft tissue gas. IMPRESSION: 1. Displaced patellar fractures. No dislocation. 2. Moderate joint effusion. Electronically Signed   By: Anner Crete M.D.   On: 10/27/2019 19:30   DG Hand Complete Right  Result Date: 10/27/2019 CLINICAL DATA:  Status post trauma. EXAM: RIGHT HAND - COMPLETE 3+ VIEW COMPARISON:  None. FINDINGS: Acute fracture is seen involving the middle phalanx of the second right finger. There is no evidence of dislocation. A radiopaque ring (i.e.  Jewelry) is seen overlying the proximal phalanx of the fourth right finger. A radiopaque pulse on motor is seen overlying the distal phalanx of the fourth right finger. There is no evidence of arthropathy or other focal bone abnormality. Soft tissues are unremarkable. IMPRESSION: Acute fracture of the middle phalanx of the second right finger. Electronically Signed   By: Virgina Norfolk M.D.   On: 10/27/2019 19:29   - pertinent xrays, CT, MRI studies were reviewed and independently interpreted  Positive ROS: All other systems have been reviewed and were otherwise negative with the exception of those mentioned in the HPI and as above.  Physical Exam: General: Alert, no acute distress Psychiatric: Patient is competent for consent with normal mood and affect Lymphatic: No axillary or cervical lymphadenopathy Cardiovascular: No pedal edema Respiratory: No cyanosis, no use of accessory musculature GI: No organomegaly, abdomen is soft and non-tender    Images:  @ENCIMAGES @  Labs:  Lab Results  Component Value Date   HGBA1C 5.4 09/02/2015   HGBA1C 5.7 12/16/2013    Lab Results  Component Value Date   ALBUMIN 4.5 02/10/2016   ALBUMIN 4.6 02/02/2015   ALBUMIN 4.6 12/16/2013    Neurologic: Patient does not have protective sensation bilateral lower extremities.   MUSCULOSKELETAL:   Large knee effusion of the right knee with diffuse tenderness throughout.  No skin breakdown or open wound noted.  Unable to perform straight leg raise of the right leg.  Able to perform straight leg raise of the left leg.  No significant tenderness to palpation throughout the right tibial shaft, right ankle, right foot.  No pain elicited in the right groin with logroll of the right lower extremity.  Right index finger is bruised and swollen around the middle phalanx.  Mild diffuse tenderness throughout the left knee.  Small effusion present.  No significant tenderness to palpation throughout the left ankle,  left tibial shaft, no pain with logroll of the left lower extremity.  No tenderness throughout the bilateral wrists, forearms, shoulders.  No pain throughout the left hand.  Assessment: Displaced three-part patellar fracture of the right knee  Plan: Discussed patient and radiographs with Dr. Alphonzo Severance.  Plan to proceed with right patellar fracture ORIF this evening, likely around 5 PM.  N.p.o. as of now.  Hold any blood thinners.  Does not seem like she has received Lovenox according to the chart.  Edit: plan changed to proceed with procedure tomorrow afternoon instead of today.  NPO at midnight tonight, she may eat today.  Continue to hold anticoagulation, aspirin is okay.    Thank you for the consult and the opportunity to see April Castillo, April Castillo 9:27 AM

## 2019-10-28 NOTE — Plan of Care (Signed)

## 2019-10-28 NOTE — Plan of Care (Signed)

## 2019-10-29 ENCOUNTER — Inpatient Hospital Stay (HOSPITAL_COMMUNITY): Payer: Medicare Other

## 2019-10-29 ENCOUNTER — Encounter (HOSPITAL_COMMUNITY)
Admission: EM | Disposition: A | Discharge status: Skilled Nursing Facility | Payer: Self-pay | Source: Home / Self Care | Attending: Internal Medicine

## 2019-10-29 ENCOUNTER — Inpatient Hospital Stay (HOSPITAL_COMMUNITY): Payer: Medicare Other | Admitting: Certified Registered Nurse Anesthetist

## 2019-10-29 ENCOUNTER — Encounter (HOSPITAL_COMMUNITY): Payer: Self-pay | Admitting: Internal Medicine

## 2019-10-29 HISTORY — PX: ORIF PATELLA: SHX5033

## 2019-10-29 LAB — BASIC METABOLIC PANEL
Anion gap: 7 (ref 5–15)
BUN: 25 mg/dL — ABNORMAL HIGH (ref 8–23)
CO2: 27 mmol/L (ref 22–32)
Calcium: 8.7 mg/dL — ABNORMAL LOW (ref 8.9–10.3)
Chloride: 106 mmol/L (ref 98–111)
Creatinine, Ser: 0.77 mg/dL (ref 0.44–1.00)
GFR calc Af Amer: >60 mL/min (ref >60)
GFR calc non Af Amer: >60 mL/min (ref >60)
Glucose, Bld: 102 mg/dL — ABNORMAL HIGH (ref 70–99)
Potassium: 4.2 mmol/L (ref 3.5–5.1)
Sodium: 140 mmol/L (ref 135–145)

## 2019-10-29 LAB — CBC
HCT: 32.9 % — ABNORMAL LOW (ref 36.0–46.0)
Hemoglobin: 10.3 g/dL — ABNORMAL LOW (ref 12.0–15.0)
MCH: 31.3 pg (ref 26.0–34.0)
MCHC: 31.3 g/dL (ref 30.0–36.0)
MCV: 100 fL (ref 80.0–100.0)
Platelets: 199 10*3/uL (ref 150–400)
RBC: 3.29 MIL/uL — ABNORMAL LOW (ref 3.87–5.11)
RDW: 13 % (ref 11.5–15.5)
WBC: 6.4 10*3/uL (ref 4.0–10.5)
nRBC: 0 % (ref 0.0–0.2)

## 2019-10-29 SURGERY — OPEN REDUCTION INTERNAL FIXATION (ORIF) PATELLA
Anesthesia: General | Site: Knee | Laterality: Right

## 2019-10-29 MED ORDER — LACTATED RINGERS IV SOLN: INTRAVENOUS | Status: AC

## 2019-10-29 MED ORDER — MORPHINE SULFATE (PF) 4 MG/ML IV SOLN
INTRAVENOUS | Status: AC
  Filled 2019-10-29: qty 2

## 2019-10-29 MED ORDER — CLONIDINE HCL (ANALGESIA) 100 MCG/ML EP SOLN
EPIDURAL | Status: DC | PRN
  Administered 2019-10-29: 100 ug

## 2019-10-29 MED ORDER — METHOCARBAMOL 500 MG PO TABS
500.0000 mg | ORAL_TABLET | Freq: Four times a day (QID) | ORAL | Status: DC | PRN
  Administered 2019-10-29 – 2019-11-05 (×10): 500 mg via ORAL
  Filled 2019-10-29 (×10): qty 1

## 2019-10-29 MED ORDER — ACETAMINOPHEN 500 MG PO TABS
1000.0000 mg | ORAL_TABLET | Freq: Four times a day (QID) | ORAL | Status: AC
  Administered 2019-10-29 – 2019-10-30 (×4): 1000 mg via ORAL
  Filled 2019-10-29 (×4): qty 2

## 2019-10-29 MED ORDER — CEFAZOLIN SODIUM 1 G IJ SOLR
INTRAMUSCULAR | Status: AC
  Filled 2019-10-29: qty 20

## 2019-10-29 MED ORDER — VANCOMYCIN HCL 1000 MG IV SOLR
INTRAVENOUS | Status: DC | PRN
  Administered 2019-10-29: 1 g

## 2019-10-29 MED ORDER — MIDAZOLAM HCL 2 MG/2ML IJ SOLN
INTRAMUSCULAR | Status: AC
  Filled 2019-10-29: qty 2

## 2019-10-29 MED ORDER — ONDANSETRON HCL 4 MG/2ML IJ SOLN
INTRAMUSCULAR | Status: AC
  Filled 2019-10-29: qty 2

## 2019-10-29 MED ORDER — FENTANYL CITRATE (PF) 250 MCG/5ML IJ SOLN
INTRAMUSCULAR | Status: AC
  Filled 2019-10-29: qty 5

## 2019-10-29 MED ORDER — HYDROMORPHONE HCL 1 MG/ML IJ SOLN: 0.5000 mg | INTRAMUSCULAR | Status: DC | PRN

## 2019-10-29 MED ORDER — EPHEDRINE SULFATE-NACL 50-0.9 MG/10ML-% IV SOSY
PREFILLED_SYRINGE | INTRAVENOUS | Status: DC | PRN
  Administered 2019-10-29: 10 mg via INTRAVENOUS

## 2019-10-29 MED ORDER — OXYCODONE HCL 5 MG PO TABS
5.0000 mg | ORAL_TABLET | ORAL | Status: DC | PRN
  Administered 2019-10-29 – 2019-10-31 (×5): 10 mg via ORAL
  Filled 2019-10-29 (×5): qty 2

## 2019-10-29 MED ORDER — OXYCODONE HCL 5 MG/5ML PO SOLN: 5.0000 mg | Freq: Once | ORAL | Status: DC | PRN

## 2019-10-29 MED ORDER — FENTANYL CITRATE (PF) 100 MCG/2ML IJ SOLN: 50.0000 ug | Freq: Once | INTRAMUSCULAR | Status: AC

## 2019-10-29 MED ORDER — ROCURONIUM BROMIDE 10 MG/ML (PF) SYRINGE
PREFILLED_SYRINGE | INTRAVENOUS | Status: AC
  Filled 2019-10-29: qty 10

## 2019-10-29 MED ORDER — DOCUSATE SODIUM 100 MG PO CAPS
100.0000 mg | ORAL_CAPSULE | Freq: Two times a day (BID) | ORAL | Status: DC
  Administered 2019-10-29 – 2019-11-02 (×8): 100 mg via ORAL
  Filled 2019-10-29 (×8): qty 1

## 2019-10-29 MED ORDER — CLONIDINE HCL (ANALGESIA) 100 MCG/ML EP SOLN
EPIDURAL | Status: AC
  Filled 2019-10-29: qty 10

## 2019-10-29 MED ORDER — METOCLOPRAMIDE HCL 5 MG PO TABS: 5.0000 mg | ORAL_TABLET | Freq: Three times a day (TID) | ORAL | Status: DC | PRN

## 2019-10-29 MED ORDER — LACTATED RINGERS IV SOLN: INTRAVENOUS | Status: DC

## 2019-10-29 MED ORDER — FENTANYL CITRATE (PF) 100 MCG/2ML IJ SOLN
INTRAMUSCULAR | Status: DC | PRN
  Administered 2019-10-29 (×6): 25 ug via INTRAVENOUS

## 2019-10-29 MED ORDER — SODIUM CHLORIDE 0.9 % IR SOLN
Status: DC | PRN
  Administered 2019-10-29: 3000 mL

## 2019-10-29 MED ORDER — PROPOFOL 10 MG/ML IV BOLUS
INTRAVENOUS | Status: AC
  Filled 2019-10-29: qty 20

## 2019-10-29 MED ORDER — VANCOMYCIN HCL 1000 MG IV SOLR
INTRAVENOUS | Status: AC
  Filled 2019-10-29: qty 1000

## 2019-10-29 MED ORDER — MORPHINE SULFATE 4 MG/ML IJ SOLN
INTRAMUSCULAR | Status: DC | PRN
  Administered 2019-10-29: 2 mL via INTRA_ARTICULAR

## 2019-10-29 MED ORDER — DEXAMETHASONE SODIUM PHOSPHATE 10 MG/ML IJ SOLN
INTRAMUSCULAR | Status: DC | PRN
  Administered 2019-10-29: 10 mg via INTRAVENOUS

## 2019-10-29 MED ORDER — ROPIVACAINE HCL 7.5 MG/ML IJ SOLN
INTRAMUSCULAR | Status: DC | PRN
  Administered 2019-10-29: 20 mL via PERINEURAL

## 2019-10-29 MED ORDER — CHLORHEXIDINE GLUCONATE CLOTH 2 % EX PADS
6.0000 | MEDICATED_PAD | Freq: Every day | CUTANEOUS | Status: AC
  Administered 2019-10-29 – 2019-11-02 (×4): 6 via TOPICAL

## 2019-10-29 MED ORDER — PROPOFOL 10 MG/ML IV BOLUS
INTRAVENOUS | Status: DC | PRN
  Administered 2019-10-29: 140 mg via INTRAVENOUS

## 2019-10-29 MED ORDER — FENTANYL CITRATE (PF) 100 MCG/2ML IJ SOLN
INTRAMUSCULAR | Status: AC
  Administered 2019-10-29: 50 ug via INTRAVENOUS
  Filled 2019-10-29: qty 2

## 2019-10-29 MED ORDER — LIDOCAINE 2% (20 MG/ML) 5 ML SYRINGE
INTRAMUSCULAR | Status: DC | PRN
  Administered 2019-10-29: 60 mg via INTRAVENOUS

## 2019-10-29 MED ORDER — BUPIVACAINE HCL (PF) 0.25 % IJ SOLN
INTRAMUSCULAR | Status: DC | PRN
  Administered 2019-10-29: 30 mL

## 2019-10-29 MED ORDER — MUPIROCIN 2 % EX OINT
TOPICAL_OINTMENT | CUTANEOUS | Status: AC
  Filled 2019-10-29: qty 22

## 2019-10-29 MED ORDER — BUPIVACAINE HCL (PF) 0.25 % IJ SOLN
INTRAMUSCULAR | Status: AC
  Filled 2019-10-29: qty 30

## 2019-10-29 MED ORDER — LIDOCAINE 2% (20 MG/ML) 5 ML SYRINGE
INTRAMUSCULAR | Status: AC
  Filled 2019-10-29: qty 5

## 2019-10-29 MED ORDER — METOCLOPRAMIDE HCL 5 MG/ML IJ SOLN: 5.0000 mg | Freq: Three times a day (TID) | INTRAMUSCULAR | Status: DC | PRN

## 2019-10-29 MED ORDER — ONDANSETRON HCL 4 MG/2ML IJ SOLN
INTRAMUSCULAR | Status: DC | PRN
  Administered 2019-10-29: 4 mg via INTRAVENOUS

## 2019-10-29 MED ORDER — OXYCODONE HCL 5 MG PO TABS: 5.0000 mg | ORAL_TABLET | Freq: Once | ORAL | Status: DC | PRN

## 2019-10-29 MED ORDER — MIDAZOLAM HCL 5 MG/5ML IJ SOLN
INTRAMUSCULAR | Status: DC | PRN
  Administered 2019-10-29: 2 mg via INTRAVENOUS

## 2019-10-29 MED ORDER — MUPIROCIN 2 % EX OINT
1.0000 "application " | TOPICAL_OINTMENT | Freq: Two times a day (BID) | CUTANEOUS | Status: AC
  Administered 2019-10-29 – 2019-11-03 (×10): 1 via NASAL

## 2019-10-29 MED ORDER — FENTANYL CITRATE (PF) 100 MCG/2ML IJ SOLN: 25.0000 ug | INTRAMUSCULAR | Status: DC | PRN

## 2019-10-29 MED ORDER — CEFAZOLIN SODIUM-DEXTROSE 2-3 GM-%(50ML) IV SOLR
INTRAVENOUS | Status: DC | PRN
  Administered 2019-10-29: 2 g via INTRAVENOUS

## 2019-10-29 MED ORDER — ONDANSETRON HCL 4 MG/2ML IJ SOLN: 4.0000 mg | Freq: Once | INTRAMUSCULAR | Status: DC | PRN

## 2019-10-29 MED ORDER — METHOCARBAMOL 1000 MG/10ML IJ SOLN
500.0000 mg | Freq: Four times a day (QID) | INTRAVENOUS | Status: DC | PRN
  Filled 2019-10-29: qty 5

## 2019-10-29 MED ORDER — CEFAZOLIN SODIUM-DEXTROSE 1-4 GM/50ML-% IV SOLN
1.0000 g | Freq: Three times a day (TID) | INTRAVENOUS | Status: AC
  Administered 2019-10-29 – 2019-10-30 (×3): 1 g via INTRAVENOUS
  Filled 2019-10-29 (×3): qty 50

## 2019-10-29 MED ORDER — CHLORHEXIDINE GLUCONATE 0.12 % MT SOLN: 15.0000 mL | Freq: Once | OROMUCOSAL | Status: AC

## 2019-10-29 MED ORDER — DEXAMETHASONE SODIUM PHOSPHATE 10 MG/ML IJ SOLN
INTRAMUSCULAR | Status: AC
  Filled 2019-10-29: qty 1

## 2019-10-29 MED ORDER — CHLORHEXIDINE GLUCONATE 0.12 % MT SOLN
OROMUCOSAL | Status: AC
  Administered 2019-10-29: 15 mL via OROMUCOSAL
  Filled 2019-10-29: qty 15

## 2019-10-29 SURGICAL SUPPLY — 49 items
BANDAGE ESMARK 6X9 LF (GAUZE/BANDAGES/DRESSINGS) ×1 IMPLANT
BIT DRILL 2 CANN GRADUATED (BIT) ×2 IMPLANT
BNDG CMPR 9X6 STRL LF SNTH (GAUZE/BANDAGES/DRESSINGS) ×1
BNDG CMPR MED 10X6 ELC LF (GAUZE/BANDAGES/DRESSINGS) ×1
BNDG ELASTIC 6X10 VLCR STRL LF (GAUZE/BANDAGES/DRESSINGS) ×2 IMPLANT
BNDG ELASTIC 6X5.8 VLCR STR LF (GAUZE/BANDAGES/DRESSINGS) ×3 IMPLANT
BNDG ESMARK 6X9 LF (GAUZE/BANDAGES/DRESSINGS) ×3
CLOSURE STERI-STRIP 1/2X4 (GAUZE/BANDAGES/DRESSINGS) ×1
CLSR STERI-STRIP ANTIMIC 1/2X4 (GAUZE/BANDAGES/DRESSINGS) ×1 IMPLANT
COVER SURGICAL LIGHT HANDLE (MISCELLANEOUS) ×3 IMPLANT
CUFF TOURN SGL QUICK 34 (TOURNIQUET CUFF)
CUFF TOURN SGL QUICK 42 (TOURNIQUET CUFF) IMPLANT
CUFF TRNQT CYL 34X4.125X (TOURNIQUET CUFF) IMPLANT
DRAPE C-ARM 42X72 X-RAY (DRAPES) ×3 IMPLANT
DRESSING AQUACEL AG SP 3.5X10 (GAUZE/BANDAGES/DRESSINGS) IMPLANT
DRSG AQUACEL AG SP 3.5X10 (GAUZE/BANDAGES/DRESSINGS) ×3
ELECT REM PT RETURN 9FT ADLT (ELECTROSURGICAL) ×3
ELECTRODE REM PT RTRN 9FT ADLT (ELECTROSURGICAL) ×1 IMPLANT
GLOVE BIO SURGEON STRL SZ8 (GLOVE) ×6 IMPLANT
GLOVE BIOGEL PI IND STRL 8 (GLOVE) ×1 IMPLANT
GLOVE BIOGEL PI INDICATOR 8 (GLOVE) ×2
GOWN STRL REUS W/ TWL LRG LVL3 (GOWN DISPOSABLE) IMPLANT
GOWN STRL REUS W/ TWL XL LVL3 (GOWN DISPOSABLE) ×2 IMPLANT
GOWN STRL REUS W/TWL LRG LVL3 (GOWN DISPOSABLE)
GOWN STRL REUS W/TWL XL LVL3 (GOWN DISPOSABLE) ×6
GUIDEWIRE 1.35MM (WIRE) ×2 IMPLANT
GUIDEWIRE 1.6 (WIRE) ×3
GUIDEWIRE ORTH 157X1.6XTROC (WIRE) IMPLANT
KIT BASIN OR (CUSTOM PROCEDURE TRAY) ×3 IMPLANT
KIT TURNOVER KIT B (KITS) ×3 IMPLANT
NS IRRIG 1000ML POUR BTL (IV SOLUTION) ×3 IMPLANT
PACK ORTHO EXTREMITY (CUSTOM PROCEDURE TRAY) ×3 IMPLANT
PASSER SUT SWANSON 36MM LOOP (INSTRUMENTS) ×2 IMPLANT
SPONGE LAP 18X18 RF (DISPOSABLE) ×2 IMPLANT
STOCKINETTE IMPERVIOUS 9X36 MD (GAUZE/BANDAGES/DRESSINGS) ×3 IMPLANT
SUT MON AB 3-0 SH 27 (SUTURE) ×3
SUT MON AB 3-0 SH27 (SUTURE) IMPLANT
SUT VIC AB 0 CT1 27 (SUTURE) ×12
SUT VIC AB 0 CT1 27XBRD ANBCTR (SUTURE) IMPLANT
SUT VIC AB 1 CT1 36 (SUTURE) ×8 IMPLANT
SUT VIC AB 2-0 CT1 27 (SUTURE) ×6
SUT VIC AB 2-0 CT1 TAPERPNT 27 (SUTURE) IMPLANT
SUTURE TAPE 1.3 40 TPR END (SUTURE) IMPLANT
SUTURETAPE 1.3 40 TPR END (SUTURE) ×6
TOWEL GREEN STERILE (TOWEL DISPOSABLE) ×3 IMPLANT
TOWEL GREEN STERILE FF (TOWEL DISPOSABLE) ×3 IMPLANT
TUBE CONNECTING 12'X1/4 (SUCTIONS) ×1
TUBE CONNECTING 12X1/4 (SUCTIONS) ×2 IMPLANT
UNDERPAD 30X36 HEAVY ABSORB (UNDERPADS AND DIAPERS) ×3 IMPLANT

## 2019-10-29 NOTE — TOC CAGE-AID Note (Signed)
Transition of Care [facility]) - CAGE-AID Screening   Patient Details  Name: April Castillo MRN: [id number] Date of Birth: [birth year]  Transition of Care [facility]) CM/SW Contact:    Emeterio Reeve, [place] Phone Number: 10/29/2019, 11:46 AM   Clinical Narrative:  CSW met with pt at bedside. CSW introduced self and explained her role at the hospital. Pt denied alcohol use and substance use.  CAGE-AID Screening:    Have You Ever Felt You Ought to Cut Down on Your Drinking or Drug Use?: No Have People Annoyed You By Critizing Your Drinking Or Drug Use?: No Have You Felt Bad Or Guilty About Your Drinking Or Drug Use?: No Have You Ever Had a Drink or Used Drugs First Thing In The Morning to STeady Your Nerves or to Get Rid of a Hangover?: No CAGE-AID Score: 0  Substance Abuse Education Offered: Yes      Blima Ledger, [place] Social Worker [phone]

## 2019-10-29 NOTE — Brief Op Note (Signed)
   10/29/2019  7:45 PM  PATIENT:  April Castillo  64 y.o. female  PRE-OPERATIVE DIAGNOSIS:  closed displaced fracture of right patella  POST-OPERATIVE DIAGNOSIS:  closed displaced fracture of right patella  PROCEDURE:  Procedure(s): OPEN REDUCTION INTERNAL (ORIF) FIXATION PATELLA  SURGEON:  Surgeon(s): Marlou Sa Tonna Corner, MD  ASSISTANT: Annie Main, PA  ANESTHESIA:   general  EBL: 75 ml    No intake/output data recorded.  BLOOD ADMINISTERED: none  DRAINS: none   LOCAL MEDICATIONS USED: Marcaine morphine clonidine  SPECIMEN:  No Specimen  COUNTS:  YES  TOURNIQUET:   Total Tourniquet Time Documented: Thigh (Right) - 70 minutes Total: Thigh (Right) - 70 minutes   DICTATION: .Other Dictation: Dictation Number [id number]  PLAN OF CARE: Admit to inpatient   PATIENT DISPOSITION:  PACU - hemodynamically stable

## 2019-10-29 NOTE — Progress Notes (Signed)
Patient stable.  Plan for surgery on right knee patella fracture today.  Left knee also has effusion and possible longitudinal cleft in the patella seen on plain radiographs.  Because of the effusion in the left knee I think it is advisable to get CT scan of the left knee to rule out occult fracture.  Regarding the right index finger she currently is in a splint which immobilizes across the MCP joint.  We will change that today to a splint that immobilizes short of the MCP joint so that her lumbricals can stretch back out.  This looks like a nonoperative fracture on the middle phalanx of the index finger on the right-hand side.  All questions answered and the risk and benefits discussed about the patella fracture surgery.  Also expected rehab discussed.

## 2019-10-29 NOTE — Progress Notes (Signed)
PT Cancellation Note  Patient Details Name: STEFANY STARACE MRN: [id number] DOB: 1956/01/06   Cancelled Treatment:    Reason Eval/Treat Not Completed: Patient at procedure or test/unavailable.  Pt is in the OR for R knee patellar fx surgery.  Will await post op orders/instructions and follow up tomorrow AM.   Thanks,  Verdene Lennert, PT, DPT  Acute Rehabilitation [phone] pager #[phone] office       Wells Guiles B Mashonda Broski 10/29/2019, 12:50 PM

## 2019-10-29 NOTE — Anesthesia Procedure Notes (Signed)
Procedure Name: LMA Insertion Date/Time: 10/29/2019 1:28 PM Performed by: Genelle Bal, CRNA Pre-anesthesia Checklist: Patient identified, Emergency Drugs available, Suction available and Patient being monitored Patient Re-evaluated:Patient Re-evaluated prior to induction Oxygen Delivery Method: Circle system utilized Preoxygenation: Pre-oxygenation with 100% oxygen Induction Type: IV induction Ventilation: Mask ventilation without difficulty LMA: LMA inserted LMA Size: 4.0 Number of attempts: 1 Airway Equipment and Method: Bite block Placement Confirmation: positive ETCO2 Tube secured with: Tape Dental Injury: Teeth and Oropharynx as per pre-operative assessment

## 2019-10-29 NOTE — Progress Notes (Signed)
Pharmacy Consult to Restart PTA direct oral anticoagulant (DOAC) on POD1 pending AM hemoglobin being stable and not requiring blood transfusion   Medical History: Past Medical History:  Diagnosis Date  . Depression   . Heart murmur   . Hip fracture, right ([place]) 11/23/2010  . Hypertension   . Kidney stone   . KNEE PAIN, LEFT 11/07/2009  . Other and unspecified hyperlipidemia 01/01/2013  . PERIMENOPAUSAL SYNDROME 11/07/2009  . Rosacea 06/20/2006  . Tension headache 06/20/2006       . UTERINE FIBROID 06/20/2006    Medications:  Medications Prior to Admission  Medication Sig Dispense Refill Last Dose  . alendronate (FOSAMAX) 70 MG tablet Take 70 mg by mouth once a week. Take with a full glass of water on an empty stomach.   Past Week at Unknown time  . aspirin EC 81 MG tablet Take 81 mg by mouth daily. Swallow whole.   10/27/2019 at Unknown time  . FLUoxetine (PROZAC) 20 MG capsule Take 1 capsule (20 mg total) by mouth daily. 90 capsule 1 10/27/2019 at Unknown time  . ibuprofen (ADVIL,MOTRIN) 800 MG tablet Take 800 mg by mouth every 8 (eight) hours as needed. for low back pain   Past Week at Unknown time  . lisinopril (PRINIVIL,ZESTRIL) 10 MG tablet TAKE 1 TABLET DAILY (Patient taking differently: Take 10 mg by mouth daily. ) 90 tablet 3 10/27/2019 at Unknown time  . rosuvastatin (CRESTOR) 20 MG tablet Take 20 mg by mouth daily.   10/27/2019 at Unknown time  . fluticasone (FLONASE) 50 MCG/ACT nasal spray Place 2 sprays into both nostrils daily. (Patient not taking: Reported on 10/27/2019) 16 g 6 Not Taking at Unknown time  . olopatadine (PATANOL) 0.1 % ophthalmic solution Place 1 drop into both eyes 2 (two) times daily. (Patient not taking: Reported on 10/27/2019) 5 mL 12 Not Taking at Unknown time   Scheduled:  . [MAR Hold] aspirin EC  81 mg Oral Daily  . [MAR Hold] Chlorhexidine Gluconate Cloth  6 each Topical Daily  . [MAR Hold] enoxaparin (LOVENOX) injection  40 mg Subcutaneous Daily  . [MAR Hold]  FLUoxetine  20 mg Oral Daily  . [MAR Hold] lisinopril  10 mg Oral Daily  . [MAR Hold] mupirocin ointment  1 application Nasal BID  . mupirocin ointment      . [MAR Hold] rosuvastatin  20 mg Oral Daily  . [MAR Hold] sodium chloride flush  3 mL Intravenous Q12H    Assessment/Plan: 64 y.o female s/p ORIF right knee on 10/29/19.  Reviewed PTA medication list and past medical history noted above. She was not taking a  direct oral anticoagulant (DOAC) prior to admission.  Pharmacy will sign off.   Nicole Cella, RPh Clinical Pharmacist 10/29/2019,4:45 PM

## 2019-10-29 NOTE — Op Note (Signed)
NAME: AAIRA, OESTREICHER MEDICAL RECORD [id number] ACCOUNT [id number] DATE OF BIRTH:1956-03-29 FACILITY: [facility] LOCATION: [facility] PHYSICIAN:Mar Zettler Randel Pigg, MD  OPERATIVE REPORT  DATE OF PROCEDURE:  10/29/2019  PREOPERATIVE DIAGNOSIS:  Right knee patellar fracture.  POSTOPERATIVE DIAGNOSIS:  Right knee patellar fracture.  PROCEDURE:  Right knee patellar fracture.  PROCEDURE:  Open reduction internal fixation.  SURGEON:  Meredith Pel, MD  ASSISTANT:  Annie Main, PA.  INDICATIONS:  The patient is a 64 year old patient with right knee patellar fracture.  This is a 3-part patellar fracture by examination.  She presents now for operative management after explanation of risks and benefits.  PROCEDURE IN DETAIL:  The patient was brought to the operating room where general endotracheal anesthesia was induced.  Preoperative antibiotics administered.  Timeout was called.  Right leg was prescrubbed with alcohol and Betadine, allowed to air dry,  prepped with DuraPrep solution and draped in sterile manner.  Charlie Pitter was used to cover the operative field.  Anterior approach to knee was made.  Skin and subcutaneous tissue were sharply divided.  The patient had an obvious patellar fracture involving a  transverse fracture across the proximal two-thirds of the patella.  The distal one-fourth of the patella was a free fragment and could not be salvaged.  This did have articular surface present.  Remaining comminuted fracture fragments from the superior  aspect of the patella tendon.  Primary repair with the missing fragment was not possible; however, we did reapproximate the proximal patellar tendon plus bone.  Suture tape was utilized in [place] fashion in the patellar tendon x2.  These were also  drilled through the inferior bone fragments.  Following this, the 3 drill holes were placed through the patellar portion, which was intact, the proximal two-thirds.  The sutures were then passed and tied,  which gave very nice approximation of the  superior portion of the patellar tendon back into the bony surface of the proximal two-thirds of the patella.  This gave a very nice reapproximation.  This was checked under fluoroscopy in AP and lateral planes.  Retinaculum was also repaired using #1  Vicryl suture.  Following this, a thorough irrigation was performed.  Tourniquet was released.  Skin edges were anesthetized using Marcaine, morphine, clonidine.  A stable construct was observed.  The skin was then closed using interrupted inverted 0  Vicryl suture, 2-0 Vicryl suture and a 3-0 Monocryl.  Steri-Strips and Aquacel dressing applied, along with knee immobilizer.  Plan is weightbearing as tolerated in a Bledsoe brace.  The patient tolerated the procedure well without immediate  complication.  Luke's assistance was required at all times during the case for retraction, opening and closing, tissue mobilization and drilling.  His assistance was a medical necessity.  VN/NUANCE  D:10/29/2019 T:10/29/2019 JOB:[id number]

## 2019-10-29 NOTE — Plan of Care (Signed)

## 2019-10-29 NOTE — Progress Notes (Signed)
PROGRESS NOTE    April Castillo  XVQ:[id number] DOB: Mar 12, 1956 DOA: 10/27/2019 PCP: Gifford Shave, MD    Brief Narrative:  64 year old female with history of hypertension and hyperlipidemia admitted to the hospital with mechanical fall and bilateral knee pain.  Skeletal survey consistent with right patella fracture and admitted for surgery today.   Assessment & Plan:   Principal Problem:   Closed patellar sleeve fracture of right knee Active Problems:   Depression   Essential hypertension   Hyperlipidemia   Finger fracture, right   Intractable pain  Closed traumatic patella fracture of the right knee: Adequate pain medications.  NPO.  Going for ORIF today.  Postop pain management, mobility and therapy recommendations as per surgery. Also has left knee pain, plain x-ray were normal, repeating CT scan as per surgery today.  Right index finger fracture: Immobilized with splint.  Hypertension: Well-controlled on lisinopril continue.  Hyperlipidemia: On rosuvastatin.  Depression: On fluoxetine that is continued.   DVT prophylaxis: enoxaparin (LOVENOX) injection 40 mg Start: 10/29/19 1000   Code Status: Full code Family Communication: Sister at the bedside Disposition Plan: Status is: Inpatient  Remains inpatient appropriate because:Inpatient level of care appropriate due to severity of illness   Dispo: The patient is from: Home              Anticipated d/c is to: SNF              Anticipated d/c date is: 2 days              Patient currently is not medically stable to d/c.         Consultants:   Orthopedics  Procedures:   Going for ORIF today  Antimicrobials:   None   Subjective: Patient seen and examined.  Sister was at the bedside.  Has mild pain on both needs.  She started having some more pain on the left knee.  Looking forward for surgery today. I examined patient before she was going for surgery.  Objective: Vitals:   10/29/19 0512  10/29/19 0628 10/29/19 0959 10/29/19 1244  BP: (!) 113/56  (!) 114/55   Pulse: 73  72   Resp: 16  17   Temp: 98.3 F (36.8 C)  98 F (36.7 C)   TempSrc: Oral  Oral   SpO2: 94%  95%   Weight:  76.5 kg  76.5 kg  Height:    5' 5.98" (1.676 m)    Intake/Output Summary (Last 24 hours) at 10/29/2019 1343 Last data filed at 10/29/2019 0900 Gross per 24 hour  Intake 483 ml  Output --  Net 483 ml   Filed Weights   10/27/19 1721 10/29/19 0628 10/29/19 1244  Weight: 72.6 kg 76.5 kg 76.5 kg    Examination:  General exam: Appears calm and comfortable  Respiratory system: Clear to auscultation. Respiratory effort normal. Cardiovascular system: S1 & S2 heard, RRR. No JVD, murmurs, rubs, gallops or clicks. No pedal edema. Gastrointestinal system: Abdomen is nondistended, soft and nontender.  Central nervous system: Alert and oriented. No focal neurological deficits. Extremities: Symmetric 5 x 5 power. Right knee on immobilizer, distal neurovascular status intact Left knee with minimal pain and swelling, no palpable fracture Right index finger on a splint.    Data Reviewed: I have personally reviewed following labs and imaging studies  CBC: Recent Labs  Lab 10/27/19 2246 10/28/19 0423 10/29/19 0405  WBC 10.9* 6.7 6.4  NEUTROABS  --  4.6  --  HGB 11.6* 11.2* 10.3*  HCT 36.3 35.2* 32.9*  MCV 97.3 97.8 100.0  PLT 217 208 542   Basic Metabolic Panel: Recent Labs  Lab 10/27/19 2246 10/28/19 0423 10/29/19 0405  NA 141 139 140  K 4.2 4.1 4.2  CL 107 104 106  CO2 23 25 27   GLUCOSE 118* 108* 102*  BUN 20 20 25*  CREATININE 0.78 0.80 0.77  CALCIUM 9.2 9.0 8.7*  MG  --  2.3  --    GFR: Estimated Creatinine Clearance: 73.3 mL/min (by C-G formula based on SCr of 0.77 mg/dL). Liver Function Tests: No results for input(s): AST, ALT, ALKPHOS, BILITOT, PROT, ALBUMIN in the last 168 hours. No results for input(s): LIPASE, AMYLASE in the last 168 hours. No results for input(s):  AMMONIA in the last 168 hours. Coagulation Profile: No results for input(s): INR, PROTIME in the last 168 hours. Cardiac Enzymes: No results for input(s): CKTOTAL, CKMB, CKMBINDEX, TROPONINI in the last 168 hours. BNP (last 3 results) No results for input(s): PROBNP in the last 8760 hours. HbA1C: No results for input(s): HGBA1C in the last 72 hours. CBG: No results for input(s): GLUCAP in the last 168 hours. Lipid Profile: No results for input(s): CHOL, HDL, LDLCALC, TRIG, CHOLHDL, LDLDIRECT in the last 72 hours. Thyroid Function Tests: No results for input(s): TSH, T4TOTAL, FREET4, T3FREE, THYROIDAB in the last 72 hours. Anemia Panel: No results for input(s): VITAMINB12, FOLATE, FERRITIN, TIBC, IRON, RETICCTPCT in the last 72 hours. Sepsis Labs: No results for input(s): PROCALCITON, LATICACIDVEN in the last 168 hours.  Recent Results (from the past 240 hour(s))  SARS Coronavirus 2 by RT PCR (hospital order, performed in [facility] hospital lab) Nasopharyngeal Nasopharyngeal Swab     Status: None   Collection Time: 10/27/19 11:07 PM   Specimen: Nasopharyngeal Swab  Result Value Ref Range Status   SARS Coronavirus 2 NEGATIVE NEGATIVE Final    Comment: (NOTE) SARS-CoV-2 target nucleic acids are NOT DETECTED.  The SARS-CoV-2 RNA is generally detectable in upper and lower respiratory specimens during the acute phase of infection. The lowest concentration of SARS-CoV-2 viral copies this assay can detect is 250 copies / mL. A negative result does not preclude SARS-CoV-2 infection and should not be used as the sole basis for treatment or other patient management decisions.  A negative result may occur with improper specimen collection / handling, submission of specimen other than nasopharyngeal swab, presence of viral mutation(s) within the areas targeted by this assay, and inadequate number of viral copies (<250 copies / mL). A negative result must be combined with  clinical observations, patient history, and epidemiological information.  Fact Sheet for Patients:   StrictlyIdeas.no  Fact Sheet for Healthcare Providers: BankingDealers.co.za  This test is not yet approved or  cleared by the [place] FDA and has been authorized for detection and/or diagnosis of SARS-CoV-2 by FDA under an Emergency Use Authorization (EUA).  This EUA will remain in effect (meaning this test can be used) for the duration of the COVID-19 declaration under Section 564(b)(1) of the Act, 21 U.S.C. section 360bbb-3(b)(1), unless the authorization is terminated or revoked sooner.  Performed at [facility], [place] [address] [place], [place] [address]   Surgical pcr screen     Status: Abnormal   Collection Time: 10/28/19  3:15 AM   Specimen: Nasal Mucosa; Nasal Swab  Result Value Ref Range Status   MRSA, PCR NEGATIVE NEGATIVE Final   Staphylococcus aureus POSITIVE (A) NEGATIVE Final  Comment: (NOTE) The Xpert SA Assay (FDA approved for NASAL specimens in patients 72 years of age and older), is one component of a comprehensive surveillance program. It is not intended to diagnose infection nor to guide or monitor treatment. Performed at [facility], [place] [address], [place], [place] [address]          Radiology Studies: DG Knee Complete 4 Views Left  Result Date: 10/27/2019 CLINICAL DATA:  Status post trauma. EXAM: LEFT KNEE - COMPLETE 4+ VIEW COMPARISON:  None. FINDINGS: No evidence of acute fracture or dislocation. No evidence of arthropathy or other focal bone abnormality. There is a small joint effusion. IMPRESSION: No acute fracture or dislocation. Small joint effusion. Electronically Signed   By: Virgina Norfolk M.D.   On: 10/27/2019 19:31   DG Knee Complete 4 Views Right  Result Date: 10/27/2019 CLINICAL DATA:  64 year old female with fall bilateral knee pain. EXAM: RIGHT KNEE - COMPLETE 4+  VIEW COMPARISON:  Left knee radiograph dated 10/27/2019. FINDINGS: There is a comminuted fracture of the patella with 3 main displaced fracture fragments. There is no dislocation. The bones are osteopenic. There is a moderate joint effusion. The soft tissue swelling of the anterior knee. No radiopaque foreign object or soft tissue gas. IMPRESSION: 1. Displaced patellar fractures. No dislocation. 2. Moderate joint effusion. Electronically Signed   By: Anner Crete M.D.   On: 10/27/2019 19:30   DG Hand Complete Right  Result Date: 10/27/2019 CLINICAL DATA:  Status post trauma. EXAM: RIGHT HAND - COMPLETE 3+ VIEW COMPARISON:  None. FINDINGS: Acute fracture is seen involving the middle phalanx of the second right finger. There is no evidence of dislocation. A radiopaque ring (i.e. Jewelry) is seen overlying the proximal phalanx of the fourth right finger. A radiopaque pulse on motor is seen overlying the distal phalanx of the fourth right finger. There is no evidence of arthropathy or other focal bone abnormality. Soft tissues are unremarkable. IMPRESSION: Acute fracture of the middle phalanx of the second right finger. Electronically Signed   By: Virgina Norfolk M.D.   On: 10/27/2019 19:29        Scheduled Meds: . [MAR Hold] aspirin EC  81 mg Oral Daily  . [MAR Hold] Chlorhexidine Gluconate Cloth  6 each Topical Daily  . [MAR Hold] enoxaparin (LOVENOX) injection  40 mg Subcutaneous Daily  . [MAR Hold] FLUoxetine  20 mg Oral Daily  . [MAR Hold] lisinopril  10 mg Oral Daily  . [MAR Hold] mupirocin ointment  1 application Nasal BID  . mupirocin ointment      . [MAR Hold] rosuvastatin  20 mg Oral Daily  . [MAR Hold] sodium chloride flush  3 mL Intravenous Q12H   Continuous Infusions: . lactated ringers 10 mL/hr at 10/29/19 1320     LOS: 1 day    Time spent: 30 minutes    Barb Merino, MD [facility] Pager [phone]

## 2019-10-29 NOTE — Transfer of Care (Signed)
Immediate Anesthesia Transfer of Care Note  Patient: April Castillo  Procedure(s) Performed: OPEN REDUCTION INTERNAL (ORIF) FIXATION PATELLA (Right Knee)  Patient Location: PACU  Anesthesia Type:General  Level of Consciousness: awake and alert   Airway & Oxygen Therapy: Patient Spontanous Breathing and Patient connected to face mask oxygen  Post-op Assessment: Report given to RN and Post -op Vital signs reviewed and stable  Post vital signs: Reviewed and stable  Last Vitals:  Vitals Value Taken Time  BP 155/69 10/29/19 1557  Temp 36.2 C 10/29/19 1557  Pulse 85 10/29/19 1558  Resp 10 10/29/19 1558  SpO2 100 % 10/29/19 1558  Vitals shown include unvalidated device data.  Last Pain:  Vitals:   10/29/19 1557  TempSrc:   PainSc: (P) Asleep         Complications: No complications documented.

## 2019-10-29 NOTE — Anesthesia Preprocedure Evaluation (Addendum)
Anesthesia Evaluation  Patient identified by MRN, date of birth, ID band Patient awake    Reviewed: Allergy & Precautions, NPO status , Patient's Chart, lab work & pertinent test results  History of Anesthesia Complications Negative for: history of anesthetic complications  Airway Mallampati: II  TM Distance: >3 FB Neck ROM: Full    Dental  (+) Dental Advisory Given, Chipped,    Pulmonary neg pulmonary ROS,    Pulmonary exam normal        Cardiovascular hypertension, Pt. on medications Normal cardiovascular exam     Neuro/Psych  Headaches, PSYCHIATRIC DISORDERS Depression    GI/Hepatic negative GI ROS, Neg liver ROS,   Endo/Other  negative endocrine ROS  Renal/GU negative Renal ROS     Musculoskeletal negative musculoskeletal ROS (+)   Abdominal   Peds  Hematology negative hematology ROS (+)   Anesthesia Other Findings   Reproductive/Obstetrics                           Anesthesia Physical Anesthesia Plan  ASA: II  Anesthesia Plan: General   Post-op Pain Management:  Regional for Post-op pain   Induction: Intravenous  PONV Risk Score and Plan: 3 and Treatment may vary due to age or medical condition, Ondansetron and Dexamethasone  Airway Management Planned: LMA  Additional Equipment: None  Intra-op Plan:   Post-operative Plan: Extubation in OR  Informed Consent: I have reviewed the patients History and Physical, chart, labs and discussed the procedure including the risks, benefits and alternatives for the proposed anesthesia with the patient or authorized representative who has indicated his/her understanding and acceptance.     Dental advisory given  Plan Discussed with: CRNA and Anesthesiologist  Anesthesia Plan Comments:        Anesthesia Quick Evaluation

## 2019-10-29 NOTE — Anesthesia Procedure Notes (Addendum)
Anesthesia Regional Block: Adductor canal block   Pre-Anesthetic Checklist: ,, timeout performed, Correct Patient, Correct Site, Correct Laterality, Correct Procedure, Correct Position, site marked, Risks and benefits discussed,  Surgical consent,  Pre-op evaluation,  At surgeon's request and post-op pain management  Laterality: Right  Prep: chloraprep       Needles:  Injection technique: Single-shot  Needle Type: Echogenic Needle     Needle Length: 9cm  Needle Gauge: 21     Additional Needles:   Procedures:,,,, ultrasound used (permanent image in chart),,,,  Narrative:  Start time: 10/29/2019 1:04 PM End time: 10/29/2019 1:11 PM Injection made incrementally with aspirations every 5 mL.  Performed by: Personally  Anesthesiologist: Suzette Battiest, MD

## 2019-10-30 ENCOUNTER — Inpatient Hospital Stay (HOSPITAL_COMMUNITY): Payer: Medicare Other

## 2019-10-30 DIAGNOSIS — S82032G Displaced transverse fracture of left patella, subsequent encounter for closed fracture with delayed healing: Secondary | ICD-10-CM

## 2019-10-30 LAB — BASIC METABOLIC PANEL
Anion gap: 8 (ref 5–15)
BUN: 12 mg/dL (ref 8–23)
CO2: 25 mmol/L (ref 22–32)
Calcium: 8.4 mg/dL — ABNORMAL LOW (ref 8.9–10.3)
Chloride: 104 mmol/L (ref 98–111)
Creatinine, Ser: 0.67 mg/dL (ref 0.44–1.00)
GFR calc Af Amer: >60 mL/min (ref >60)
GFR calc non Af Amer: >60 mL/min (ref >60)
Glucose, Bld: 128 mg/dL — ABNORMAL HIGH (ref 70–99)
Potassium: 4 mmol/L (ref 3.5–5.1)
Sodium: 137 mmol/L (ref 135–145)

## 2019-10-30 LAB — CBC
HCT: 28.3 % — ABNORMAL LOW (ref 36.0–46.0)
Hemoglobin: 9.2 g/dL — ABNORMAL LOW (ref 12.0–15.0)
MCH: 31.9 pg (ref 26.0–34.0)
MCHC: 32.5 g/dL (ref 30.0–36.0)
MCV: 98.3 fL (ref 80.0–100.0)
Platelets: 174 10*3/uL (ref 150–400)
RBC: 2.88 MIL/uL — ABNORMAL LOW (ref 3.87–5.11)
RDW: 12.5 % (ref 11.5–15.5)
WBC: 6.9 10*3/uL (ref 4.0–10.5)
nRBC: 0 % (ref 0.0–0.2)

## 2019-10-30 NOTE — Plan of Care (Signed)

## 2019-10-30 NOTE — Progress Notes (Addendum)
Patient is a 64 year old female is postop day 1 s/p right knee patellar fracture ORIF.  She is doing well this morning.  She denies any severe pain.  Pain is controlled overall.  She is in a Bledsoe brace.  Plan for patient to weight-bear as tolerated in the Bledsoe brace.  Avoid knee flexion.  CT scan of patient's left knee revealed nondisplaced patella fractures with vertical orientation.  No fractures of the tibia or femur.  Plan for patient to weight-bear as tolerated on the left knee with hinged knee brace.  Continue with physical therapy today to work on mobility.  Recommend aspirin 81 mg daily for DVT prophylaxis. Patient requests disposition to April Castillo

## 2019-10-30 NOTE — Anesthesia Postprocedure Evaluation (Signed)
Anesthesia Post Note  Patient: April Castillo  Procedure(s) Performed: OPEN REDUCTION INTERNAL (ORIF) FIXATION PATELLA (Right Knee)     Patient location during evaluation: PACU Anesthesia Type: General Level of consciousness: awake Pain management: pain level controlled Vital Signs Assessment: post-procedure vital signs reviewed and stable Respiratory status: spontaneous breathing Cardiovascular status: stable Postop Assessment: no apparent nausea or vomiting Anesthetic complications: no   No complications documented.  Last Vitals:  Vitals:   10/30/19 0753 10/30/19 1435  BP: 115/60 127/61  Pulse: 80 75  Resp: 15 15  Temp: 37.1 C 37.1 C  SpO2: 97% 91%    Last Pain:  Vitals:   10/30/19 1731  TempSrc:   PainSc: 4    Pain Goal:                   Huston Foley

## 2019-10-30 NOTE — Progress Notes (Signed)
Occupational Therapy Treatment Patient Details Name: April Castillo MRN: [id number] DOB: 06/19/1955 Today's Date: 10/30/2019    History of present illness Pt is a 64 y.o. woman admitted 10/27/19 after fall while washing horse that became spooked, pt hit head on fence and fell onto bilateral knees. Sustained bilateral patella fxs, R first finger fx. S/p R patellar ORIF 7/8; non-operative management L knee. PMH includes R hip fx, HTN, HLD, depression.   OT comments  CT revealed L patella fx since evaluation on 10/28/19, per chart, pt to also receive a hinged knee brace for it as well. Pt continues to experience moderate pain in B knees, R>L, but works hard and pushes through her pain. Able to ambulate with RW 5 feet and use BSC. She continues to be an excellent rehab candidate.  Follow Up Recommendations  CIR    Equipment Recommendations  None recommended by OT    Recommendations for Other Services      Precautions / Restrictions Precautions Precautions: Fall Required Braces or Orthoses: Other Brace Knee Immobilizer - Right: On at all times Other Brace: RLE bledsoe brace locked in extension; LLE bledsoe brace hinged (no orders for locked extension) Restrictions Weight Bearing Restrictions: Yes RLE Weight Bearing: Weight bearing as tolerated LLE Weight Bearing: Weight bearing as tolerated       Mobility Bed Mobility Overal bed mobility: Needs Assistance Bed Mobility: Supine to Sit;Sit to Supine     Supine to sit: Min assist     General bed mobility comments: Gait belt provided as leg lifter, minA to assist LLE to EOB  Transfers Overall transfer level: Needs assistance Equipment used: Rolling walker (2 wheeled);2 person hand held assist Transfers: Sit to/from Stand Sit to Stand: Max assist;+2 physical assistance;Mod assist         General transfer comment: Performed multiple sit<>stands, pt initially min-modA to stand from EOB and BSC but noted increased L knee flexion.  Trialled standing while maintaining bilateral knee extension, pt required bilateral HHA and maxA+2 to elevate trunk    Balance Overall balance assessment: Needs assistance   Sitting balance-Leahy Scale: Good       Standing balance-Leahy Scale: Poor Standing balance comment: Reliant on UE support                           ADL either performed or assessed with clinical judgement   ADL Overall ADL's : Needs assistance/impaired     Grooming: Wash/dry hands;Wash/dry face;Set up;Sitting               Lower Body Dressing: Total assistance;Bed level   Toilet Transfer: Minimal assistance;+2 for physical assistance;Ambulation;RW   Toileting- Clothing Manipulation and Hygiene: Set up;Sitting/lateral lean;Sit to/from stand       Functional mobility during ADLs: Minimal assistance;Rolling walker;+2 for safety/equipment       Vision       Perception     Praxis      Cognition Arousal/Alertness: Awake/alert Behavior During Therapy: WFL for tasks assessed/performed Overall Cognitive Status: Within Functional Limits for tasks assessed                                          Exercises     Shoulder Instructions       General Comments      Pertinent Vitals/ Pain       Pain  Assessment: Faces Faces Pain Scale: Hurts even more Pain Location: Bilateral knees (R>L), L wrist at IV insertion Pain Descriptors / Indicators: Guarding;Grimacing;Sore Pain Intervention(s): Monitored during session;Repositioned;Premedicated before session  [place] expects to be discharged to:: Private residence Living Arrangements: Alone Available Help at Discharge: Family;Available PRN/intermittently Type of Home: House Home Access: Stairs to enter [employer] of Steps: 8 Entrance Stairs-Rails: Right;Left Home Layout: One level     Bathroom Shower/Tub: Teacher, early years/pre: Standard     Home Equipment: Bedside  commode;Walker - 2 wheels;Tub bench   Additional Comments: sister will check to make sure pt still has DME      Prior Functioning/Environment Level of Independence: Independent        Comments: Enjoys horseback riding   Frequency  Min 2X/week        Progress Toward Goals  OT Goals(current goals can now be found in the care plan section)  Progress towards OT goals: Progressing toward goals  Acute Rehab OT Goals Patient Stated Goal: Regain independence OT Goal Formulation: With patient Time For Goal Achievement: 11/11/19 Potential to Achieve Goals: Good  Plan Discharge plan remains appropriate    Co-evaluation          OT goals addressed during session: ADL's and self-care      AM-PAC OT "6 Clicks" Daily Activity     Outcome Measure   Help from another person eating meals?: None Help from another person taking care of personal grooming?: A Little Help from another person toileting, which includes using toliet, bedpan, or urinal?: A Lot Help from another person bathing (including washing, rinsing, drying)?: A Lot Help from another person to put on and taking off regular upper body clothing?: A Little Help from another person to put on and taking off regular lower body clothing?: Total 6 Click Score: 15    End of Session Equipment Utilized During Treatment: Gait belt;Rolling walker;Right knee immobilizer  OT Visit Diagnosis: Unsteadiness on feet (R26.81);Other abnormalities of gait and mobility (R26.89);Pain;Muscle weakness (generalized) (M62.81)   Activity Tolerance Patient tolerated treatment well   Patient Left in chair;with call bell/phone within reach   Nurse Communication Mobility status        Time: 1594-5859 OT Time Calculation (min): 15 min  Charges: OT General Charges $OT Visit: 1 Visit OT Treatments $Self Care/Home Management : 8-22 mins  Nestor Lewandowsky, OTR/L Acute Rehabilitation Services Pager: [phone] Office:  [phone]   Malka So 10/30/2019, 1:30 PM

## 2019-10-30 NOTE — Progress Notes (Signed)
Orthopedic Tech Progress Note Patient Details:  April Castillo 12-09-1955 [id number] Called in order to [facility] for a [facility] Patient ID: April Castillo, female   DOB: March 24, 1956, 64 y.o.   MRN: [id number]   April Castillo 10/30/2019, 11:44 AM

## 2019-10-30 NOTE — Evaluation (Signed)
Physical Therapy Evaluation Patient Details Name: April Castillo MRN: [id number] DOB: 07-02-1955 Today's Date: 10/30/2019   History of Present Illness  Pt is a 64 y.o. woman admitted 10/27/19 after fall while washing horse that became spooked, pt hit head on fence and fell onto bilateral knees. Sustained bilateral patella fxs, R first finger fx. S/p R patellar ORIF 7/8; non-operative management L knee. PMH includes R hip fx, HTN, HLD, depression.    Clinical Impression  Pt presents with an overall decrease in functional mobility secondary to above. PTA, pt independent, active and lives alone. Educ on precautions, positioning, brace wear and importance of mobility. Today's session focused on transfer training with attempts to minimize L knee flexion, awaiting clarification on ROM restrictions now that pt with established L patellar fx as well. When standing to maintain bilateral knee extension, pt requires maxA+2 to elevate trunk; once up, requires minA to maintain balance with short ambulation distance. Despite pain, pt highly motivated to participate and regain PLOF. Pt would benefit from intensive CIR-level therapies to maximize functional mobility and independence prior to return home; pt in agreement.    Follow Up Recommendations CIR;Supervision for mobility/OOB    Equipment Recommendations   (TBD)    Recommendations for Other Services       Precautions / Restrictions Precautions Precautions: Fall Required Braces or Orthoses: Other Brace Other Brace: RLE bledsoe brace locked in extension; LLE bledsoe brace hinged (no orders for locked extension) Restrictions Weight Bearing Restrictions: Yes RLE Weight Bearing: Weight bearing as tolerated LLE Weight Bearing: Weight bearing as tolerated      Mobility  Bed Mobility Overal bed mobility: Needs Assistance Bed Mobility: Supine to Sit;Sit to Supine     Supine to sit: Min assist     General bed mobility comments: Gait belt provided as  leg lifter, minA to assist LLE to EOB  Transfers Overall transfer level: Needs assistance Equipment used: Rolling walker (2 wheeled);2 person hand held assist Transfers: Sit to/from Stand Sit to Stand: Max assist;+2 physical assistance;Mod assist         General transfer comment: Performed multiple sit<>stands, pt initially min-modA to stand from EOB and BSC but noted increased L knee flexion. Trialled standing while maintaining bilateral knee extension, pt required bilateral HHA and maxA+2 to elevate trunk  Ambulation/Gait Ambulation/Gait assistance: Min assist Gait Distance (Feet): 6 Feet Assistive device: Rolling walker (2 wheeled) Gait Pattern/deviations: Step-to pattern;Decreased weight shift to right;Antalgic Gait velocity: Decreased   General Gait Details: Slow, antalgic steps with RW and minA for stability; encouraged increased WBAT through RLE as pt initially hopping on LLE keeping RLE NWB. Eventually able to reach heel to ground  Stairs            Wheelchair Mobility    Modified Rankin (Stroke Patients Only)       Balance Overall balance assessment: Needs assistance   Sitting balance-Leahy Scale: Good       Standing balance-Leahy Scale: Poor Standing balance comment: Reliant on UE support                             Pertinent Vitals/Pain Pain Assessment: Faces Faces Pain Scale: Hurts even more Pain Location: Bilateral knees (R>L), L wrist at IV insertion Pain Descriptors / Indicators: Guarding;Grimacing;Sore Pain Intervention(s): Monitored during session;Limited activity within patient's tolerance    Home Living Family/patient expects to be discharged to:: Private residence Living Arrangements: Alone Available Help at Discharge: Family;Available PRN/intermittently  Type of Home: House Home Access: Stairs to enter Entrance Stairs-Rails: Psychiatric nurse of Steps: 8 Home Layout: One level Home Equipment: Bedside  commode;Walker - 2 wheels;Tub bench Additional Comments: sister will check to make sure pt still has DME    Prior Function Level of Independence: Independent         Comments: Enjoys horseback riding     Hand Dominance   Dominant Hand: Right    Extremity/Trunk Assessment   Upper Extremity Assessment Upper Extremity Assessment: RUE deficits/detail RUE Deficits / Details: first finger fx in splint, otherwise WFL RUE Coordination: decreased fine motor    Lower Extremity Assessment Lower Extremity Assessment: RLE deficits/detail;LLE deficits/detail RLE Deficits / Details: s/p R patellar ORIF in bledsoe brace locked extension; able to wiggle toes and ankle, hip strength functionally at least 3/5 RLE: Unable to fully assess due to immobilization RLE Coordination: decreased gross motor;decreased fine motor LLE Deficits / Details: L patellar fx; hip strength functionally at least 3/5 LLE: Unable to fully assess due to immobilization LLE Coordination: decreased gross motor;decreased fine motor       Communication   Communication: No difficulties  Cognition Arousal/Alertness: Awake/alert Behavior During Therapy: WFL for tasks assessed/performed Overall Cognitive Status: Within Functional Limits for tasks assessed                                        General Comments      Exercises     Assessment/Plan    PT Assessment Patient needs continued PT services  PT Problem List Decreased strength;Decreased range of motion;Decreased activity tolerance;Decreased balance;Decreased mobility;Decreased knowledge of use of DME;Decreased knowledge of precautions       PT Treatment Interventions DME instruction;Gait training;Stair training;Functional mobility training;Therapeutic activities;Therapeutic exercise;Balance training;Patient/family education    PT Goals (Current goals can be found in the Care Plan section)  Acute Rehab PT Goals Patient Stated Goal: Regain  independence PT Goal Formulation: With patient Time For Goal Achievement: 11/13/19 Potential to Achieve Goals: Good    Frequency Min 5X/week   Barriers to discharge        Co-evaluation               AM-PAC PT "6 Clicks" Mobility  Outcome Measure Help needed turning from your back to your side while in a flat bed without using bedrails?: A Little Help needed moving from lying on your back to sitting on the side of a flat bed without using bedrails?: A Little Help needed moving to and from a bed to a chair (including a wheelchair)?: A Lot Help needed standing up from a chair using your arms (e.g., wheelchair or bedside chair)?: A Lot Help needed to walk in hospital room?: A Little Help needed climbing 3-5 steps with a railing? : A Lot 6 Click Score: 15    End of Session Equipment Utilized During Treatment: Gait belt Activity Tolerance: Patient tolerated treatment well Patient left: in chair;with call bell/phone within reach;with chair alarm set Nurse Communication: Mobility status PT Visit Diagnosis: Other abnormalities of gait and mobility (R26.89);Pain Pain - part of body: Knee;Leg    Time: 8502-7741 PT Time Calculation (min) (ACUTE ONLY): 15 min   Charges:   PT Evaluation $PT Eval Moderate Complexity: [place], PT, DPT Acute Rehabilitation Services  Pager [phone] Office [phone]  Derry Lory 10/30/2019, 12:54 PM

## 2019-10-30 NOTE — Progress Notes (Signed)
PROGRESS NOTE    April Castillo  TLX:[id number] DOB: Apr 27, 1955 DOA: 10/27/2019 PCP: Gifford Shave, MD    Brief Narrative:  65 year old female with history of hypertension and hyperlipidemia admitted to the hospital with mechanical fall and bilateral knee pain.  Skeletal survey consistent with right patella fracture and admitted for surgery.    Assessment & Plan:   Principal Problem:   Closed patellar sleeve fracture of right knee Active Problems:   Depression   Essential hypertension   Hyperlipidemia   Finger fracture, right   Intractable pain  Closed traumatic patella fracture of the right knee: Adequate pain medications.  Status post ORIF, day 1 postop.  Surgically stable as per surgery.  Postop pain management.  Mobility with PT OT today.   Closed traumatic patellar fracture of the left knee with no displacement: Suggested weightbearing as tolerated and pain medications.  Right index finger fracture: Immobilized with splint.  Hypertension: Well-controlled on lisinopril continue.  Hyperlipidemia: On rosuvastatin.  Depression: On fluoxetine that is continued.   DVT prophylaxis: SCDs Start: 10/29/19 1657 Place TED hose Start: 10/29/19 1657 enoxaparin (LOVENOX) injection 40 mg Start: 10/29/19 1000   Code Status: Full code Family Communication: None today. Disposition Plan: Status is: Inpatient  Remains inpatient appropriate because:Inpatient level of care appropriate due to severity of illness   Dispo: The patient is from: Home              Anticipated d/c is to: SNF versus acute inpatient rehab.              Anticipated d/c date is: 2 days              Patient currently is medically stable to transfer to rehab level of care.         Consultants:   Orthopedics  Procedures:   ORIF right knee, 7/8  Antimicrobials:   None   Subjective: Patient seen and examined.  No overnight events.  Some pain remains on the knee on the post operative site.   Minimal pain on the left knee.  Objective: Vitals:   10/29/19 2010 10/30/19 0005 10/30/19 0306 10/30/19 0753  BP: (!) 108/50 114/62 (!) 112/59 115/60  Pulse: 87 89 83 80  Resp: 17 16 16 15   Temp: 98.7 F (37.1 C) 98.5 F (36.9 C) 98.5 F (36.9 C) 98.7 F (37.1 C)  TempSrc: Oral Oral Oral Oral  SpO2: 96% 97% 99% 97%  Weight:      Height:        Intake/Output Summary (Last 24 hours) at 10/30/2019 1100 Last data filed at 10/30/2019 0900 Gross per 24 hour  Intake 1598.55 ml  Output 75 ml  Net 1523.55 ml   Filed Weights   10/27/19 1721 10/29/19 0628 10/29/19 1244  Weight: 72.6 kg 76.5 kg 76.5 kg    Examination:  General exam: Appears calm and comfortable  Respiratory system: Clear to auscultation. Respiratory effort normal. Cardiovascular system: S1 & S2 heard, RRR. No JVD, murmurs, rubs, gallops or clicks. No pedal edema. Gastrointestinal system: Abdomen is nondistended, soft and nontender.  Central nervous system: Alert and oriented. No focal neurological deficits. Extremities: Symmetric 5 x 5 power. Right knee on immobilizer brace.  Distal neurovascular status intact. Left knee with minimal pain and swelling,  Right index finger on a splint.    Data Reviewed: I have personally reviewed following labs and imaging studies  CBC: Recent Labs  Lab 10/27/19 2246 10/28/19 0423 10/29/19 0405 10/30/19 0706  WBC  10.9* 6.7 6.4 6.9  NEUTROABS  --  4.6  --   --   HGB 11.6* 11.2* 10.3* 9.2*  HCT 36.3 35.2* 32.9* 28.3*  MCV 97.3 97.8 100.0 98.3  PLT 217 208 199 841   Basic Metabolic Panel: Recent Labs  Lab 10/27/19 2246 10/28/19 0423 10/29/19 0405 10/30/19 0706  NA 141 139 140 137  K 4.2 4.1 4.2 4.0  CL 107 104 106 104  CO2 23 25 27 25   GLUCOSE 118* 108* 102* 128*  BUN 20 20 25* 12  CREATININE 0.78 0.80 0.77 0.67  CALCIUM 9.2 9.0 8.7* 8.4*  MG  --  2.3  --   --    GFR: Estimated Creatinine Clearance: 73.3 mL/min (by C-G formula based on SCr of 0.67  mg/dL). Liver Function Tests: No results for input(s): AST, ALT, ALKPHOS, BILITOT, PROT, ALBUMIN in the last 168 hours. No results for input(s): LIPASE, AMYLASE in the last 168 hours. No results for input(s): AMMONIA in the last 168 hours. Coagulation Profile: No results for input(s): INR, PROTIME in the last 168 hours. Cardiac Enzymes: No results for input(s): CKTOTAL, CKMB, CKMBINDEX, TROPONINI in the last 168 hours. BNP (last 3 results) No results for input(s): PROBNP in the last 8760 hours. HbA1C: No results for input(s): HGBA1C in the last 72 hours. CBG: No results for input(s): GLUCAP in the last 168 hours. Lipid Profile: No results for input(s): CHOL, HDL, LDLCALC, TRIG, CHOLHDL, LDLDIRECT in the last 72 hours. Thyroid Function Tests: No results for input(s): TSH, T4TOTAL, FREET4, T3FREE, THYROIDAB in the last 72 hours. Anemia Panel: No results for input(s): VITAMINB12, FOLATE, FERRITIN, TIBC, IRON, RETICCTPCT in the last 72 hours. Sepsis Labs: No results for input(s): PROCALCITON, LATICACIDVEN in the last 168 hours.  Recent Results (from the past 240 hour(s))  SARS Coronavirus 2 by RT PCR (hospital order, performed in [facility] hospital lab) Nasopharyngeal Nasopharyngeal Swab     Status: None   Collection Time: 10/27/19 11:07 PM   Specimen: Nasopharyngeal Swab  Result Value Ref Range Status   SARS Coronavirus 2 NEGATIVE NEGATIVE Final    Comment: (NOTE) SARS-CoV-2 target nucleic acids are NOT DETECTED.  The SARS-CoV-2 RNA is generally detectable in upper and lower respiratory specimens during the acute phase of infection. The lowest concentration of SARS-CoV-2 viral copies this assay can detect is 250 copies / mL. A negative result does not preclude SARS-CoV-2 infection and should not be used as the sole basis for treatment or other patient management decisions.  A negative result may occur with improper specimen collection / handling, submission of specimen  other than nasopharyngeal swab, presence of viral mutation(s) within the areas targeted by this assay, and inadequate number of viral copies (<250 copies / mL). A negative result must be combined with clinical observations, patient history, and epidemiological information.  Fact Sheet for Patients:   StrictlyIdeas.no  Fact Sheet for Healthcare Providers: BankingDealers.co.za  This test is not yet approved or  cleared by the [place] FDA and has been authorized for detection and/or diagnosis of SARS-CoV-2 by FDA under an Emergency Use Authorization (EUA).  This EUA will remain in effect (meaning this test can be used) for the duration of the COVID-19 declaration under Section 564(b)(1) of the Act, 21 U.S.C. section 360bbb-3(b)(1), unless the authorization is terminated or revoked sooner.  Performed at [facility], [place] [address] [place], [place] [address]   Surgical pcr screen     Status: Abnormal   Collection  Time: 10/28/19  3:15 AM   Specimen: Nasal Mucosa; Nasal Swab  Result Value Ref Range Status   MRSA, PCR NEGATIVE NEGATIVE Final   Staphylococcus aureus POSITIVE (A) NEGATIVE Final    Comment: (NOTE) The Xpert SA Assay (FDA approved for NASAL specimens in patients 77 years of age and older), is one component of a comprehensive surveillance program. It is not intended to diagnose infection nor to guide or monitor treatment. Performed at [facility], [place] [address], [place], [place] [address]          Radiology Studies: DG Knee 1-2 Views Right  Result Date: 10/29/2019 CLINICAL DATA:  ORIF right patella fracture. EXAM: RIGHT KNEE - 1-2 VIEW; DG C-ARM 1-60 MIN COMPARISON:  Radiograph 10/27/2019 FINDINGS: Two fluoroscopic spot views of the right knee obtained in the operating room. Patellar fracture. No hardware is visualized on the provided views. Total fluoroscopy time 8 seconds. Total dose 0.34 mGy.  IMPRESSION: Intraoperative fluoroscopy during right knee patellar surgery. Electronically Signed   By: Keith Rake M.D.   On: 10/29/2019 18:46   CT KNEE LEFT WO CONTRAST  Result Date: 10/30/2019 CLINICAL DATA:  Known right knee fracture, query initially radiographically occult injury of the left knee. EXAM: CT OF THE left KNEE WITHOUT CONTRAST TECHNIQUE: Multidetector CT imaging of the left knee was performed according to the standard protocol. Multiplanar CT image reconstructions were also generated. COMPARISON:  Radiograph 10/27/2019 FINDINGS: Bones/Joint/Cartilage Subtle linear nondisplaced fractures of the left knee. One of these extends obliquely along the inferomedial pole of the left patella as shown on image 67/6. There also appears to be a subtle linear vertically oriented fracture plane at the junction of the middle and lateral patellar thirds as shown on image 72/6. There is considerable degenerative articular cartilage thinning in the medial compartment and moderate degenerative chondral thinning in the lateral compartment causing joint space narrowing. No other fractures around the knee are identified. Small to moderate-sized hemarthrosis. Ligaments Suboptimally assessed by CT. Structures with expected contour for the ACL and PCL are observed. Muscles and Tendons Unremarkable Soft tissues Subcutaneous edema anterior to the patellar tendon and along the patellar retinacula, lateral greater than medial. IMPRESSION: 1. Subtle linear nondisplaced fractures of the left patella. One of these extends obliquely along the inferomedial pole of the left patella. There also appears to be a subtle linear vertically oriented fracture plane at the junction of the middle and lateral patellar thirds. 2. Small to moderate-sized hemarthrosis. 3. Subcutaneous edema anterior to the patellar tendon and along the patellar retinacula, lateral greater than medial. 4. Considerable degenerative articular cartilage  thinning in the medial compartment and moderate degenerative chondral thinning in the lateral compartment. Electronically Signed   By: Van Clines M.D.   On: 10/30/2019 06:11   DG Knee Right Port  Result Date: 10/29/2019 CLINICAL DATA:  Postop right patellar fracture. EXAM: PORTABLE RIGHT KNEE - 1-2 VIEW COMPARISON:  Preoperative radiograph 09/28/2019 FINDINGS: Debridement of the lower patellar pole. Few comminuted fragments inferiorly. Recent postsurgical change includes air and edema in the joint space and soft tissues. Tibiofemoral alignment is maintained. IMPRESSION: Postsurgical fixation of patellar fracture. Electronically Signed   By: Keith Rake M.D.   On: 10/29/2019 18:48   DG C-Arm 1-60 Min  Result Date: 10/29/2019 CLINICAL DATA:  ORIF right patella fracture. EXAM: RIGHT KNEE - 1-2 VIEW; DG C-ARM 1-60 MIN COMPARISON:  Radiograph 10/27/2019 FINDINGS: Two fluoroscopic spot views of the right knee obtained in the operating room.  Patellar fracture. No hardware is visualized on the provided views. Total fluoroscopy time 8 seconds. Total dose 0.34 mGy. IMPRESSION: Intraoperative fluoroscopy during right knee patellar surgery. Electronically Signed   By: Keith Rake M.D.   On: 10/29/2019 18:46        Scheduled Meds: . acetaminophen  1,000 mg Oral Q6H  . aspirin EC  81 mg Oral Daily  . Chlorhexidine Gluconate Cloth  6 each Topical Daily  . docusate sodium  100 mg Oral BID  . enoxaparin (LOVENOX) injection  40 mg Subcutaneous Daily  . FLUoxetine  20 mg Oral Daily  . lisinopril  10 mg Oral Daily  . mupirocin ointment  1 application Nasal BID  . rosuvastatin  20 mg Oral Daily  . sodium chloride flush  3 mL Intravenous Q12H   Continuous Infusions: .  ceFAZolin (ANCEF) IV 1 g (10/30/19 0532)  . methocarbamol (ROBAXIN) IV       LOS: 2 days    Time spent: 30 minutes    Barb Merino, MD [facility] Pager [phone]

## 2019-10-30 NOTE — Progress Notes (Signed)
  Subjective: Patient stable.  Pain controlled.  Patient does have longitudinal fracture of the patella on that left knee.  This is a stable fracture and she should be okay for weightbearing in a hinged knee brace on the left knee.  On the right-hand side she will need to continue to ambulate weightbearing as tolerated with that right knee locked in extension   Objective: Vital signs in last 24 hours: Temp:  [97.1 F (36.2 C)-98.8 F (37.1 C)] 98.7 F (37.1 C) (07/09 0753) Pulse Rate:  [78-91] 80 (07/09 0753) Resp:  [12-18] 15 (07/09 0753) BP: (108-155)/(50-69) 115/60 (07/09 0753) SpO2:  [95 %-100 %] 97 % (07/09 0753) Weight:  [76.5 kg] 76.5 kg (07/08 1244)  Intake/Output from previous day: 07/08 0701 - 07/09 0700 In: 1358.6 [P.O.:240; I.V.:1118.6] Out: 75 [Blood:75] Intake/Output this shift: Total I/O In: 343 [P.O.:240; I.V.:3; IV Piggyback:100] Out: -   Exam:  Dorsiflexion/Plantar flexion intact  Labs: Recent Labs    10/27/19 2246 10/28/19 0423 10/29/19 0405 10/30/19 0706  HGB 11.6* 11.2* 10.3* 9.2*   Recent Labs    10/29/19 0405 10/30/19 0706  WBC 6.4 6.9  RBC 3.29* 2.88*  HCT 32.9* 28.3*  PLT 199 174   Recent Labs    10/29/19 0405 10/30/19 0706  NA 140 137  K 4.2 4.0  CL 106 104  CO2 27 25  BUN 25* 12  CREATININE 0.77 0.67  GLUCOSE 102* 128*  CALCIUM 8.7* 8.4*   No results for input(s): LABPT, INR in the last 72 hours.  Assessment/Plan: Plan is possible skilled nursing versus inpatient rehab.  She does not really have anyone at home.  With bilateral leg fractures hard to say if she is going to be able to go home with home health in several days.   Landry Dyke Mette [place]gate 10/30/2019, 12:17 PM

## 2019-10-31 LAB — BASIC METABOLIC PANEL
Anion gap: 10 (ref 5–15)
BUN: 11 mg/dL (ref 8–23)
CO2: 25 mmol/L (ref 22–32)
Calcium: 8.5 mg/dL — ABNORMAL LOW (ref 8.9–10.3)
Chloride: 106 mmol/L (ref 98–111)
Creatinine, Ser: 0.8 mg/dL (ref 0.44–1.00)
GFR calc Af Amer: >60 mL/min (ref >60)
GFR calc non Af Amer: >60 mL/min (ref >60)
Glucose, Bld: 121 mg/dL — ABNORMAL HIGH (ref 70–99)
Potassium: 3.6 mmol/L (ref 3.5–5.1)
Sodium: 141 mmol/L (ref 135–145)

## 2019-10-31 LAB — CBC
HCT: 31.7 % — ABNORMAL LOW (ref 36.0–46.0)
Hemoglobin: 10.2 g/dL — ABNORMAL LOW (ref 12.0–15.0)
MCH: 31.7 pg (ref 26.0–34.0)
MCHC: 32.2 g/dL (ref 30.0–36.0)
MCV: 98.4 fL (ref 80.0–100.0)
Platelets: 212 10*3/uL (ref 150–400)
RBC: 3.22 MIL/uL — ABNORMAL LOW (ref 3.87–5.11)
RDW: 13 % (ref 11.5–15.5)
WBC: 10.6 10*3/uL — ABNORMAL HIGH (ref 4.0–10.5)
nRBC: 0 % (ref 0.0–0.2)

## 2019-10-31 NOTE — Plan of Care (Signed)

## 2019-10-31 NOTE — Progress Notes (Signed)
Patient stable. Having slightly more pain today. CT scan left knee reviewed.  The fracture is more of a small fracture off the lateral aspect of the distal patella.  She should be okay for weightbearing and transferring on that left leg Okay to weight-bear as tolerated on the right leg but I do not want the right knee bending C IR consult placed Plan at this time is for some type of rehab to allow [facility] to become more independent.  Anticipate decisions on skilled nursing versus inpatient rehab by Monday

## 2019-10-31 NOTE — Progress Notes (Signed)
PROGRESS NOTE    April Castillo  TDS:[id number] DOB: [birth year] DOA: 10/27/2019 PCP: Gifford Shave, MD    Brief Narrative:  64 year old female with history of hypertension and hyperlipidemia admitted to the hospital with mechanical fall and bilateral knee pain.  Skeletal survey consistent with right patella fracture and admitted for surgery. She was handling horse, fell on the front concrete with both knees and right hand. This is her third injury in her life related to horse riding. Assessment & Plan:   Principal Problem:   Closed patellar sleeve fracture of right knee Active Problems:   Depression   Essential hypertension   Hyperlipidemia   Finger fracture, right   Intractable pain  Closed traumatic patella fracture of the right knee: Adequate pain medications.  Status post ORIF, day 3 postop.  Surgically stable as per surgery.  Postop pain management.  Waiting to have bed available at CIR.  Closed traumatic patellar fracture of the left knee with no displacement: Suggested weightbearing as tolerated and pain medications.  Right index finger fracture: Immobilized with splint.  Hypertension: Well-controlled on lisinopril continue.  Hyperlipidemia: On rosuvastatin.  Depression: On fluoxetine that is continued.   DVT prophylaxis: SCDs Start: 10/29/19 1657 Place TED hose Start: 10/29/19 1657 enoxaparin (LOVENOX) injection 40 mg Start: 10/29/19 1000   Code Status: Full code Family Communication: None today. Disposition Plan: Status is: Inpatient  Remains inpatient appropriate because:Inpatient level of care appropriate due to severity of illness   Dispo: The patient is from: Home              Anticipated d/c is to: Acute inpatient rehab.              Anticipated d/c date is: When bed available.              Patient currently is medically stable to transfer to rehab level of care.   Consultants:   Orthopedics  Procedures:   ORIF right knee,  7/8  Antimicrobials:   None   Subjective: Seen and examined.  Minimal pain bilateral knee, no other overnight events.  Attempting to work with PT.  Objective: Vitals:   10/30/19 1919 10/31/19 0403 10/31/19 0500 10/31/19 0738  BP: (!) 152/70 (!) 143/70  (!) 157/72  Pulse: 87 81  78  Resp: 18 17  17   Temp: 97.8 F (36.6 C) 98.6 F (37 C)  98.1 F (36.7 C)  TempSrc: Oral Oral  Oral  SpO2: 94% 90%  94%  Weight:   75.4 kg   Height:        Intake/Output Summary (Last 24 hours) at 10/31/2019 1219 Last data filed at 10/31/2019 1000 Gross per 24 hour  Intake 483 ml  Output --  Net 483 ml   Filed Weights   10/29/19 0628 10/29/19 1244 10/31/19 0500  Weight: 76.5 kg 76.5 kg 75.4 kg    Examination:  General exam: Appears calm and comfortable  Respiratory system: Clear to auscultation. Respiratory effort normal. Cardiovascular system: S1 & S2 heard, RRR. No JVD, murmurs, rubs, gallops or clicks. No pedal edema. Gastrointestinal system: Abdomen is nondistended, soft and nontender.  Central nervous system: Alert and oriented. No focal neurological deficits. Extremities: Symmetric 5 x 5 power. Right knee minimal swelling, incision clean and dry.  Distal neurovascular status intact. Left knee with minimal pain and swelling,  Right index finger on a splint.    Data Reviewed: I have personally reviewed following labs and imaging studies  CBC: Recent Labs  Lab  10/27/19 2246 10/28/19 0423 10/29/19 0405 10/30/19 0706 10/31/19 0458  WBC 10.9* 6.7 6.4 6.9 10.6*  NEUTROABS  --  4.6  --   --   --   HGB 11.6* 11.2* 10.3* 9.2* 10.2*  HCT 36.3 35.2* 32.9* 28.3* 31.7*  MCV 97.3 97.8 100.0 98.3 98.4  PLT 217 208 199 174 502   Basic Metabolic Panel: Recent Labs  Lab 10/27/19 2246 10/28/19 0423 10/29/19 0405 10/30/19 0706 10/31/19 0458  NA 141 139 140 137 141  K 4.2 4.1 4.2 4.0 3.6  CL 107 104 106 104 106  CO2 23 25 27 25 25   GLUCOSE 118* 108* 102* 128* 121*  BUN 20 20  25* 12 11  CREATININE 0.78 0.80 0.77 0.67 0.80  CALCIUM 9.2 9.0 8.7* 8.4* 8.5*  MG  --  2.3  --   --   --    GFR: Estimated Creatinine Clearance: 72.7 mL/min (by C-G formula based on SCr of 0.8 mg/dL). Liver Function Tests: No results for input(s): AST, ALT, ALKPHOS, BILITOT, PROT, ALBUMIN in the last 168 hours. No results for input(s): LIPASE, AMYLASE in the last 168 hours. No results for input(s): AMMONIA in the last 168 hours. Coagulation Profile: No results for input(s): INR, PROTIME in the last 168 hours. Cardiac Enzymes: No results for input(s): CKTOTAL, CKMB, CKMBINDEX, TROPONINI in the last 168 hours. BNP (last 3 results) No results for input(s): PROBNP in the last 8760 hours. HbA1C: No results for input(s): HGBA1C in the last 72 hours. CBG: No results for input(s): GLUCAP in the last 168 hours. Lipid Profile: No results for input(s): CHOL, HDL, LDLCALC, TRIG, CHOLHDL, LDLDIRECT in the last 72 hours. Thyroid Function Tests: No results for input(s): TSH, T4TOTAL, FREET4, T3FREE, THYROIDAB in the last 72 hours. Anemia Panel: No results for input(s): VITAMINB12, FOLATE, FERRITIN, TIBC, IRON, RETICCTPCT in the last 72 hours. Sepsis Labs: No results for input(s): PROCALCITON, LATICACIDVEN in the last 168 hours.  Recent Results (from the past 240 hour(s))  SARS Coronavirus 2 by RT PCR (hospital order, performed in [facility] hospital lab) Nasopharyngeal Nasopharyngeal Swab     Status: None   Collection Time: 10/27/19 11:07 PM   Specimen: Nasopharyngeal Swab  Result Value Ref Range Status   SARS Coronavirus 2 NEGATIVE NEGATIVE Final    Comment: (NOTE) SARS-CoV-2 target nucleic acids are NOT DETECTED.  The SARS-CoV-2 RNA is generally detectable in upper and lower respiratory specimens during the acute phase of infection. The lowest concentration of SARS-CoV-2 viral copies this assay can detect is 250 copies / mL. A negative result does not preclude SARS-CoV-2 infection and  should not be used as the sole basis for treatment or other patient management decisions.  A negative result may occur with improper specimen collection / handling, submission of specimen other than nasopharyngeal swab, presence of viral mutation(s) within the areas targeted by this assay, and inadequate number of viral copies (<250 copies / mL). A negative result must be combined with clinical observations, patient history, and epidemiological information.  Fact Sheet for Patients:   StrictlyIdeas.no  Fact Sheet for Healthcare Providers: BankingDealers.co.za  This test is not yet approved or  cleared by the [place] FDA and has been authorized for detection and/or diagnosis of SARS-CoV-2 by FDA under an Emergency Use Authorization (EUA).  This EUA will remain in effect (meaning this test can be used) for the duration of the COVID-19 declaration under Section 564(b)(1) of the Act, 21 U.S.C. section 360bbb-3(b)(1), unless the authorization  is terminated or revoked sooner.  Performed at [facility], [place] [address], [place], [place] [address]   Surgical pcr screen     Status: Abnormal   Collection Time: 10/28/19  3:15 AM   Specimen: Nasal Mucosa; Nasal Swab  Result Value Ref Range Status   MRSA, PCR NEGATIVE NEGATIVE Final   Staphylococcus aureus POSITIVE (A) NEGATIVE Final    Comment: (NOTE) The Xpert SA Assay (FDA approved for NASAL specimens in patients 25 years of age and older), is one component of a comprehensive surveillance program. It is not intended to diagnose infection nor to guide or monitor treatment. Performed at [facility], [place] [address], [place],  [address]          Radiology Studies: DG Knee 1-2 Views Right  Result Date: 10/29/2019 CLINICAL DATA:  ORIF right patella fracture. EXAM: RIGHT KNEE - 1-2 VIEW; DG C-ARM 1-60 MIN COMPARISON:  Radiograph 10/27/2019 FINDINGS: Two fluoroscopic  spot views of the right knee obtained in the operating room. Patellar fracture. No hardware is visualized on the provided views. Total fluoroscopy time 8 seconds. Total dose 0.34 mGy. IMPRESSION: Intraoperative fluoroscopy during right knee patellar surgery. Electronically Signed   By: Keith Rake M.D.   On: 10/29/2019 18:46   CT KNEE LEFT WO CONTRAST  Result Date: 10/30/2019 CLINICAL DATA:  Known right knee fracture, query initially radiographically occult injury of the left knee. EXAM: CT OF THE left KNEE WITHOUT CONTRAST TECHNIQUE: Multidetector CT imaging of the left knee was performed according to the standard protocol. Multiplanar CT image reconstructions were also generated. COMPARISON:  Radiograph 10/27/2019 FINDINGS: Bones/Joint/Cartilage Subtle linear nondisplaced fractures of the left knee. One of these extends obliquely along the inferomedial pole of the left patella as shown on image 67/6. There also appears to be a subtle linear vertically oriented fracture plane at the junction of the middle and lateral patellar thirds as shown on image 72/6. There is considerable degenerative articular cartilage thinning in the medial compartment and moderate degenerative chondral thinning in the lateral compartment causing joint space narrowing. No other fractures around the knee are identified. Small to moderate-sized hemarthrosis. Ligaments Suboptimally assessed by CT. Structures with expected contour for the ACL and PCL are observed. Muscles and Tendons Unremarkable Soft tissues Subcutaneous edema anterior to the patellar tendon and along the patellar retinacula, lateral greater than medial. IMPRESSION: 1. Subtle linear nondisplaced fractures of the left patella. One of these extends obliquely along the inferomedial pole of the left patella. There also appears to be a subtle linear vertically oriented fracture plane at the junction of the middle and lateral patellar thirds. 2. Small to moderate-sized  hemarthrosis. 3. Subcutaneous edema anterior to the patellar tendon and along the patellar retinacula, lateral greater than medial. 4. Considerable degenerative articular cartilage thinning in the medial compartment and moderate degenerative chondral thinning in the lateral compartment. Electronically Signed   By: Van Clines M.D.   On: 10/30/2019 06:11   DG Knee Right Port  Result Date: 10/29/2019 CLINICAL DATA:  Postop right patellar fracture. EXAM: PORTABLE RIGHT KNEE - 1-2 VIEW COMPARISON:  Preoperative radiograph 09/28/2019 FINDINGS: Debridement of the lower patellar pole. Few comminuted fragments inferiorly. Recent postsurgical change includes air and edema in the joint space and soft tissues. Tibiofemoral alignment is maintained. IMPRESSION: Postsurgical fixation of patellar fracture. Electronically Signed   By: Keith Rake M.D.   On: 10/29/2019 18:48   DG C-Arm 1-60 Min  Result Date: 10/29/2019 CLINICAL DATA:  ORIF right patella fracture. EXAM: RIGHT KNEE - 1-2 VIEW; DG C-ARM 1-60 MIN COMPARISON:  Radiograph 10/27/2019 FINDINGS: Two fluoroscopic spot views of the right knee obtained in the operating room. Patellar fracture. No hardware is visualized on the provided views. Total fluoroscopy time 8 seconds. Total dose 0.34 mGy. IMPRESSION: Intraoperative fluoroscopy during right knee patellar surgery. Electronically Signed   By: Keith Rake M.D.   On: 10/29/2019 18:46        Scheduled Meds: . aspirin EC  81 mg Oral Daily  . Chlorhexidine Gluconate Cloth  6 each Topical Daily  . docusate sodium  100 mg Oral BID  . enoxaparin (LOVENOX) injection  40 mg Subcutaneous Daily  . FLUoxetine  20 mg Oral Daily  . lisinopril  10 mg Oral Daily  . mupirocin ointment  1 application Nasal BID  . rosuvastatin  20 mg Oral Daily  . sodium chloride flush  3 mL Intravenous Q12H   Continuous Infusions: . methocarbamol (ROBAXIN) IV       LOS: 3 days    Time spent: 25  minutes    Barb Merino, MD [facility] Pager [phone]

## 2019-10-31 NOTE — Progress Notes (Signed)
Inpatient Rehab Admissions:  Inpatient Rehab Consult received.  I met with pt at the bedside for rehabilitation assessment and to discuss goals and expectations of an inpatient rehab admission.  Pt appears to be an appropriate candidate for potential CIR admission. Pt acknowledged understanding of CIR goals and expectations. Pt interested in pursuing CIR.  Will continue to monitor pt's progress with acute therapies.   Pt gave [facility] permission to contact sister.  Called and left message.  Awaiting return call from sister.   Signed: Gayland Curry, [place], [place] Admissions Coordinator [phone]

## 2019-10-31 NOTE — Progress Notes (Signed)
Physical Therapy Treatment Patient Details Name: April Castillo MRN: [id number] DOB: [birth year] Today's Date: 10/31/2019    History of Present Illness Pt is a 64 y.o. woman admitted 10/27/19 after fall while washing horse that became spooked, pt hit head on fence and fell onto bilateral knees. Sustained bilateral patella fxs, R first finger fx. S/p R patellar ORIF 7/8; non-operative management L knee. PMH includes R hip fx, HTN, HLD, depression.    PT Comments    The pt was OOB on the [facility] upon arrival of PT, agreeable to limited session due to reports of significant pain this morning. The pt was able to demo improvements in transfer ability, and completed x2 sit-stand with minA to her RLE for comfort but no assist needed to power up. The pt was also able to take a few lateral steps along the edge of the bed, but was unable to get good clearance with either foot, and will continue to benefit from skilled PT to progress functional strength and stability to improve ambulation quality and endurance as well as to facilitate return to prior level of function and activity. The pt remains an excellent candidate for CIR therapies.     Follow Up Recommendations  CIR;Supervision for mobility/OOB     Equipment Recommendations   (defer to post acute)    Recommendations for Other Services       Precautions / Restrictions Precautions Precautions: Fall Required Braces or Orthoses: Other Brace;Knee Immobilizer - Right Knee Immobilizer - Right: On at all times Other Brace: RLE bledsoe brace locked in extension; LLE bledsoe brace hinged (no orders for locked extension) Restrictions Weight Bearing Restrictions: Yes RLE Weight Bearing: Weight bearing as tolerated LLE Weight Bearing: Weight bearing as tolerated    Mobility  Bed Mobility Overal bed mobility: Needs Assistance Bed Mobility: Sit to Supine       Sit to supine: Mod assist   General bed mobility comments: Pt OOB on BSC upon arrival, modA  to move BLE into bed  Transfers Overall transfer level: Needs assistance Equipment used: Rolling walker (2 wheeled) Transfers: Sit to/from Stand Sit to Stand: Min assist         General transfer comment: pt able to power up without assist from [facility], minA to RLE to maintain comfortable position, then minG to steady once up. minA at RLE again during lower to bed  Ambulation/Gait Ambulation/Gait assistance: Min assist Gait Distance (Feet): 3 Feet Assistive device: Rolling walker (2 wheeled) Gait Pattern/deviations: Step-to pattern;Decreased weight shift to right;Antalgic Gait velocity: Decreased   General Gait Details: Slow, antalgic steps with RW and minA for stability, pt declined progression of ambulation today due to reports of increased pain compared to yesterday,      Balance Overall balance assessment: Needs assistance Sitting-balance support: No upper extremity supported Sitting balance-Leahy Scale: Good     Standing balance support: Bilateral upper extremity supported Standing balance-Leahy Scale: Poor Standing balance comment: Reliant on UE support                            Cognition Arousal/Alertness: Awake/alert Behavior During Therapy: WFL for tasks assessed/performed Overall Cognitive Status: Within Functional Limits for tasks assessed                                               Pertinent  Vitals/Pain Pain Assessment: Faces Faces Pain Scale: Hurts even more Pain Location: Bilateral knees (R>L) Pain Descriptors / Indicators: Guarding;Grimacing;Sore Pain Intervention(s): Limited activity within patient's tolerance;Monitored during session;Repositioned           PT Goals (current goals can now be found in the care plan section) Acute Rehab PT Goals Patient Stated Goal: Regain independence PT Goal Formulation: With patient Time For Goal Achievement: 11/13/19 Potential to Achieve Goals: Good Progress towards PT goals:  Progressing toward goals    Frequency    Min 5X/week      PT Plan Current plan remains appropriate       AM-PAC PT "6 Clicks" Mobility   Outcome Measure  Help needed turning from your back to your side while in a flat bed without using bedrails?: A Little Help needed moving from lying on your back to sitting on the side of a flat bed without using bedrails?: A Little Help needed moving to and from a bed to a chair (including a wheelchair)?: A Lot Help needed standing up from a chair using your arms (e.g., wheelchair or bedside chair)?: A Lot Help needed to walk in hospital room?: A Little Help needed climbing 3-5 steps with a railing? : A Lot 6 Click Score: 15    End of Session Equipment Utilized During Treatment: Gait belt Activity Tolerance: Patient tolerated treatment well;Patient limited by pain Patient left: in bed;with call bell/phone within reach Nurse Communication: Mobility status PT Visit Diagnosis: Other abnormalities of gait and mobility (R26.89);Pain Pain - part of body: Knee;Leg (bilateral)     Time: 4098-1191 PT Time Calculation (min) (ACUTE ONLY): 12 min  Charges:  $Therapeutic Activity: 8-22 mins                     Karma Ganja, PT, DPT   Acute Rehabilitation Department Pager #: [phone]   Otho Bellows 10/31/2019, 12:44 PM

## 2019-11-01 LAB — BASIC METABOLIC PANEL WITH GFR
Anion gap: 10 (ref 5–15)
BUN: 9 mg/dL (ref 8–23)
CO2: 26 mmol/L (ref 22–32)
Calcium: 8.8 mg/dL — ABNORMAL LOW (ref 8.9–10.3)
Chloride: 106 mmol/L (ref 98–111)
Creatinine, Ser: 0.67 mg/dL (ref 0.44–1.00)
GFR calc Af Amer: >60 mL/min
GFR calc non Af Amer: >60 mL/min
Glucose, Bld: 119 mg/dL — ABNORMAL HIGH (ref 70–99)
Potassium: 3.6 mmol/L (ref 3.5–5.1)
Sodium: 142 mmol/L (ref 135–145)

## 2019-11-01 LAB — CBC
HCT: 31.6 % — ABNORMAL LOW (ref 36.0–46.0)
Hemoglobin: 10.4 g/dL — ABNORMAL LOW (ref 12.0–15.0)
MCH: 32.2 pg (ref 26.0–34.0)
MCHC: 32.9 g/dL (ref 30.0–36.0)
MCV: 97.8 fL (ref 80.0–100.0)
Platelets: 223 10*3/uL (ref 150–400)
RBC: 3.23 MIL/uL — ABNORMAL LOW (ref 3.87–5.11)
RDW: 13 % (ref 11.5–15.5)
WBC: 7.2 10*3/uL (ref 4.0–10.5)
nRBC: 0 % (ref 0.0–0.2)

## 2019-11-01 MED ORDER — POLYETHYLENE GLYCOL 3350 17 G PO PACK
17.0000 g | PACK | Freq: Every day | ORAL | Status: DC
  Administered 2019-11-01 – 2019-11-02 (×2): 17 g via ORAL
  Filled 2019-11-01 (×2): qty 1

## 2019-11-01 NOTE — Progress Notes (Signed)
Inpatient Rehab Admissions Coordinator:  Pt's sister, Denine Brotz returned call today.  Explained CIR goals and expectations to her. She acknowledged understanding. She indicated that she would be available to assist pt when she is discharged from hospital.     Gayland Curry, [place], [place] Admissions Coordinator [phone]

## 2019-11-01 NOTE — Progress Notes (Signed)
Occupational Therapy Treatment Patient Details Name: April Castillo MRN: [id number] DOB: [birth year] Today's Date: 11/01/2019    History of present illness Pt is a 64 y.o. woman admitted 10/27/19 after fall while washing horse that became spooked, pt hit head on fence and fell onto bilateral knees. Sustained bilateral patella fxs, R first finger fx. S/p R patellar ORIF 7/8; non-operative management L knee. PMH includes R hip fx, HTN, HLD, depression.   OT comments  Pt received supine in bed, agreeable to OT intervention. Pt continues to present with pain and decreased strength and ROM in BLEs impacting pts ability to complete BADLs. Overall, pt requires MIN A +2 for safety for functional mobility with able to ambulate ~ 10 ft with close chair follow. Pt requires most assist to manage RLE during bed mobility and while descending into recliner. Pt currently requires set- up for UB  ADLs and total A for LB ADLs. DC plan remains appropriate, will follow acutely per POC.   Follow Up Recommendations  CIR    Equipment Recommendations  None recommended by OT    Recommendations for Other Services      Precautions / Restrictions Precautions Precautions: Fall Required Braces or Orthoses: Other Brace;Knee Immobilizer - Right Knee Immobilizer - Right: On at all times;Other (comment) (bledsoe brace) Knee Immobilizer - Left: Other (comment) (L knee brace) Other Brace: RLE bledsoe brace locked in extension; LLE knee brace( no orders for locked extension on LLE) Restrictions Weight Bearing Restrictions: Yes RLE Weight Bearing: Weight bearing as tolerated LLE Weight Bearing: Weight bearing as tolerated       Mobility Bed Mobility Overal bed mobility: Needs Assistance Bed Mobility: Supine to Sit     Supine to sit: Min assist     General bed mobility comments: MIN A to maneuver RLE to EOB and lower to floor, pt able to reach across to bed rail and scoot hips to EOB  Transfers Overall transfer  level: Needs assistance Equipment used: Rolling walker (2 wheeled) Transfers: Sit to/from Stand Sit to Stand: Min assist;+2 safety/equipment         General transfer comment: MIN A +2 for equipment mgmt, pt prefer to pull up on RW to come into fulll stand. assist to manage RLE while pt descends into recliner    Balance Overall balance assessment: Needs assistance Sitting-balance support: No upper extremity supported Sitting balance-Leahy Scale: Good     Standing balance support: Bilateral upper extremity supported Standing balance-Leahy Scale: Poor Standing balance comment: Reliant on UE support                           ADL either performed or assessed with clinical judgement   ADL Overall ADL's : Needs assistance/impaired     Grooming: Oral care;Wash/dry face;Sitting;Set up Grooming Details (indicate cue type and reason): completed grooming tasks in recliner with set- up assist                 Toilet Transfer: Minimal assistance;Ambulation;RW;+2 for safety/equipment Toilet Transfer Details (indicate cue type and reason): simulated via functional mobility; most assist needed to manage keeping RLE in extension during sit<>stands         Functional mobility during ADLs: Minimal assistance;Rolling walker;+2 for safety/equipment General ADL Comments: pt with improved pain tolerance this session     Vision       Perception     Praxis      Cognition Arousal/Alertness: Awake/alert Behavior During Therapy: [facility]  for tasks assessed/performed Overall Cognitive Status: Within Functional Limits for tasks assessed                                          Exercises     Shoulder Instructions       General Comments      Pertinent Vitals/ Pain       Pain Assessment: 0-10 Pain Score: 1  Pain Location: RLE Pain Descriptors / Indicators: Guarding;Grimacing;Sore Pain Intervention(s): Monitored during session;Repositioned  Home Living                                           Prior Functioning/Environment              Frequency  Min 2X/week        Progress Toward Goals  OT Goals(current goals can now be found in the care plan section)  Progress towards OT goals: Progressing toward goals  Acute Rehab OT Goals Patient Stated Goal: Regain independence OT Goal Formulation: With patient Time For Goal Achievement: 11/11/19 Potential to Achieve Goals: Good  Plan Discharge plan remains appropriate;Frequency remains appropriate    Co-evaluation                 AM-PAC OT "6 Clicks" Daily Activity     Outcome Measure   Help from another person eating meals?: None Help from another person taking care of personal grooming?: None Help from another person toileting, which includes using toliet, bedpan, or urinal?: A Little Help from another person bathing (including washing, rinsing, drying)?: A Lot Help from another person to put on and taking off regular upper body clothing?: None Help from another person to put on and taking off regular lower body clothing?: Total 6 Click Score: 18    End of Session Equipment Utilized During Treatment: Gait belt;Rolling walker;Right knee immobilizer;Other (comment) (L knee brace)      Activity Tolerance Patient tolerated treatment well   Patient Left in chair;with call bell/phone within reach;with chair alarm set   Nurse Communication Mobility status;Other (comment) (+2 for safety to pivot back to bed)        Time: 7564-3329 OT Time Calculation (min): 21 min  Charges: OT General Charges $OT Visit: 1 Visit OT Treatments $Self Care/Home Management : 8-22 mins  Lanier Clam., COTA/L Acute Rehabilitation Services [phone]    April Castillo 11/01/2019, 11:13 AM

## 2019-11-01 NOTE — Progress Notes (Signed)
PROGRESS NOTE    April Castillo  VQQ:[id number] DOB: 1955/05/23 DOA: 10/27/2019 PCP: Gifford Shave, MD    Brief Narrative:  64 year old female with history of hypertension and hyperlipidemia admitted to the hospital with mechanical fall and bilateral knee pain.  Skeletal survey consistent with right patella fracture and admitted for surgery. She was handling horse, fell on the front concrete with both knees and right hand. This is her third injury in her life related to horse riding. Assessment & Plan:   Principal Problem:   Closed patellar sleeve fracture of right knee Active Problems:   Depression   Essential hypertension   Hyperlipidemia   Finger fracture, right   Intractable pain  Closed traumatic patella fracture of the right knee: Adequate pain medications.  Status post ORIF, day 3 postop.  Surgically stable as per surgery.  Postop pain management.  Waiting to have bed available at [facility].  Closed traumatic patellar fracture of the left knee with no displacement: Suggested weightbearing as tolerated and pain medications.  Right index finger fracture: Immobilized with splint.  Hypertension: Well-controlled on lisinopril continue.  Hyperlipidemia: On rosuvastatin.  Depression: On fluoxetine that is continued.   DVT prophylaxis: SCDs Start: 10/29/19 1657 Place TED hose Start: 10/29/19 1657 enoxaparin (LOVENOX) injection 40 mg Start: 10/29/19 1000   Code Status: Full code Family Communication: None today. Disposition Plan: Status is: Inpatient  Remains inpatient appropriate because:Inpatient level of care appropriate due to severity of illness   Dispo: The patient is from: Home              Anticipated d/c is to: Acute inpatient rehab.              Anticipated d/c date is: When bed available.              Patient currently is medically stable to transfer to rehab level of care.   Consultants:   Orthopedics  Procedures:   ORIF right knee,  7/8  Antimicrobials:   None   Subjective: Seen and examined.  Minimal pain bilateral knee, no other overnight events.  Working with therapies.  Objective: Vitals:   10/31/19 0738 10/31/19 1354 10/31/19 1909 11/01/19 0342  BP: (!) 157/72 (!) 155/77 135/60 (!) 146/74  Pulse: 78 81 95 74  Resp: 17 18 19 15   Temp: 98.1 F (36.7 C) 98.1 F (36.7 C) 97.7 F (36.5 C) 98.6 F (37 C)  TempSrc: Oral Oral Oral Oral  SpO2: 94% 95% 95% 97%  Weight:      Height:       No intake or output data in the 24 hours ending 11/01/19 1301 Filed Weights   10/29/19 0628 10/29/19 1244 10/31/19 0500  Weight: 76.5 kg 76.5 kg 75.4 kg    Examination:  She looks comfortable.  She was working with therapist. Both knee with minimal swelling.  Distal neurovascular status intact.   Data Reviewed: I have personally reviewed following labs and imaging studies  CBC: Recent Labs  Lab 10/28/19 0423 10/29/19 0405 10/30/19 0706 10/31/19 0458 11/01/19 0301  WBC 6.7 6.4 6.9 10.6* 7.2  NEUTROABS 4.6  --   --   --   --   HGB 11.2* 10.3* 9.2* 10.2* 10.4*  HCT 35.2* 32.9* 28.3* 31.7* 31.6*  MCV 97.8 100.0 98.3 98.4 97.8  PLT 208 199 174 212 433   Basic Metabolic Panel: Recent Labs  Lab 10/28/19 0423 10/29/19 0405 10/30/19 0706 10/31/19 0458 11/01/19 0301  NA 139 140 137 141 142  K 4.1 4.2 4.0 3.6 3.6  CL 104 106 104 106 106  CO2 25 27 25 25 26   GLUCOSE 108* 102* 128* 121* 119*  BUN 20 25* 12 11 9   CREATININE 0.80 0.77 0.67 0.80 0.67  CALCIUM 9.0 8.7* 8.4* 8.5* 8.8*  MG 2.3  --   --   --   --    GFR: Estimated Creatinine Clearance: 72.7 mL/min (by C-G formula based on SCr of 0.67 mg/dL). Liver Function Tests: No results for input(s): AST, ALT, ALKPHOS, BILITOT, PROT, ALBUMIN in the last 168 hours. No results for input(s): LIPASE, AMYLASE in the last 168 hours. No results for input(s): AMMONIA in the last 168 hours. Coagulation Profile: No results for input(s): INR, PROTIME in the last  168 hours. Cardiac Enzymes: No results for input(s): CKTOTAL, CKMB, CKMBINDEX, TROPONINI in the last 168 hours. BNP (last 3 results) No results for input(s): PROBNP in the last 8760 hours. HbA1C: No results for input(s): HGBA1C in the last 72 hours. CBG: No results for input(s): GLUCAP in the last 168 hours. Lipid Profile: No results for input(s): CHOL, HDL, LDLCALC, TRIG, CHOLHDL, LDLDIRECT in the last 72 hours. Thyroid Function Tests: No results for input(s): TSH, T4TOTAL, FREET4, T3FREE, THYROIDAB in the last 72 hours. Anemia Panel: No results for input(s): VITAMINB12, FOLATE, FERRITIN, TIBC, IRON, RETICCTPCT in the last 72 hours. Sepsis Labs: No results for input(s): PROCALCITON, LATICACIDVEN in the last 168 hours.  Recent Results (from the past 240 hour(s))  SARS Coronavirus 2 by RT PCR (hospital order, performed in [facility] hospital lab) Nasopharyngeal Nasopharyngeal Swab     Status: None   Collection Time: 10/27/19 11:07 PM   Specimen: Nasopharyngeal Swab  Result Value Ref Range Status   SARS Coronavirus 2 NEGATIVE NEGATIVE Final    Comment: (NOTE) SARS-CoV-2 target nucleic acids are NOT DETECTED.  The SARS-CoV-2 RNA is generally detectable in upper and lower respiratory specimens during the acute phase of infection. The lowest concentration of SARS-CoV-2 viral copies this assay can detect is 250 copies / mL. A negative result does not preclude SARS-CoV-2 infection and should not be used as the sole basis for treatment or other patient management decisions.  A negative result may occur with improper specimen collection / handling, submission of specimen other than nasopharyngeal swab, presence of viral mutation(s) within the areas targeted by this assay, and inadequate number of viral copies (<250 copies / mL). A negative result must be combined with clinical observations, patient history, and epidemiological information.  Fact Sheet for Patients:    StrictlyIdeas.no  Fact Sheet for Healthcare Providers: BankingDealers.co.za  This test is not yet approved or  cleared by the [place] FDA and has been authorized for detection and/or diagnosis of SARS-CoV-2 by FDA under an Emergency Use Authorization (EUA).  This EUA will remain in effect (meaning this test can be used) for the duration of the COVID-19 declaration under Section 564(b)(1) of the Act, 21 U.S.C. section 360bbb-3(b)(1), unless the authorization is terminated or revoked sooner.  Performed at [facility], [place] [address], [place], [place] [address]   Surgical pcr screen     Status: Abnormal   Collection Time: 10/28/19  3:15 AM   Specimen: Nasal Mucosa; Nasal Swab  Result Value Ref Range Status   MRSA, PCR NEGATIVE NEGATIVE Final   Staphylococcus aureus POSITIVE (A) NEGATIVE Final    Comment: (NOTE) The Xpert SA Assay (FDA approved for NASAL specimens in patients 50 years of age and  older), is one component of a comprehensive surveillance program. It is not intended to diagnose infection nor to guide or monitor treatment. Performed at [facility], [place] [address], [place], [place] [address]          Radiology Studies: No results found.      Scheduled Meds: . aspirin EC  81 mg Oral Daily  . Chlorhexidine Gluconate Cloth  6 each Topical Daily  . docusate sodium  100 mg Oral BID  . enoxaparin (LOVENOX) injection  40 mg Subcutaneous Daily  . FLUoxetine  20 mg Oral Daily  . lisinopril  10 mg Oral Daily  . mupirocin ointment  1 application Nasal BID  . rosuvastatin  20 mg Oral Daily  . sodium chloride flush  3 mL Intravenous Q12H   Continuous Infusions: . methocarbamol (ROBAXIN) IV       LOS: 4 days    Time spent: 20 minutes    Barb Merino, MD [facility] Pager [phone]

## 2019-11-02 ENCOUNTER — Encounter (HOSPITAL_COMMUNITY): Payer: Self-pay | Admitting: Orthopedic Surgery

## 2019-11-02 LAB — CBC
HCT: 31.7 % — ABNORMAL LOW (ref 36.0–46.0)
Hemoglobin: 10.1 g/dL — ABNORMAL LOW (ref 12.0–15.0)
MCH: 31.1 pg (ref 26.0–34.0)
MCHC: 31.9 g/dL (ref 30.0–36.0)
MCV: 97.5 fL (ref 80.0–100.0)
Platelets: 240 10*3/uL (ref 150–400)
RBC: 3.25 MIL/uL — ABNORMAL LOW (ref 3.87–5.11)
RDW: 13.2 % (ref 11.5–15.5)
WBC: 6.5 10*3/uL (ref 4.0–10.5)
nRBC: 0 % (ref 0.0–0.2)

## 2019-11-02 LAB — BASIC METABOLIC PANEL
Anion gap: 9 (ref 5–15)
BUN: 22 mg/dL (ref 8–23)
CO2: 25 mmol/L (ref 22–32)
Calcium: 8.8 mg/dL — ABNORMAL LOW (ref 8.9–10.3)
Chloride: 106 mmol/L (ref 98–111)
Creatinine, Ser: 0.71 mg/dL (ref 0.44–1.00)
GFR calc Af Amer: >60 mL/min (ref >60)
GFR calc non Af Amer: >60 mL/min (ref >60)
Glucose, Bld: 110 mg/dL — ABNORMAL HIGH (ref 70–99)
Potassium: 3.8 mmol/L (ref 3.5–5.1)
Sodium: 140 mmol/L (ref 135–145)

## 2019-11-02 MED ORDER — MAGNESIUM CITRATE PO SOLN
0.5000 | Freq: Once | ORAL | Status: AC
  Administered 2019-11-02: 0.5 via ORAL
  Filled 2019-11-02: qty 296

## 2019-11-02 MED ORDER — SENNOSIDES-DOCUSATE SODIUM 8.6-50 MG PO TABS
1.0000 | ORAL_TABLET | Freq: Every day | ORAL | Status: DC
  Administered 2019-11-02 – 2019-11-04 (×3): 1 via ORAL
  Filled 2019-11-02 (×3): qty 1

## 2019-11-02 NOTE — Progress Notes (Signed)
Occupational Therapy Treatment Patient Details Name: April Castillo MRN: [id number] DOB: 02/20/1956 Today's Date: 11/02/2019    History of present illness Pt is a 64 y.o. woman admitted 10/27/19 after fall while washing horse that became spooked, pt hit head on fence and fell onto bilateral knees. Sustained bilateral patella fxs, R first finger fx. S/p R patellar ORIF 7/8; non-operative management L knee. PMH includes R hip fx, HTN, HLD, depression.   OT comments  Pt making good progress towards OT goals this session. Pt with improved pain tolerance this session able to stand at sink ~ 10 mins for full ADL. Pt continues to require assist managing RLE during bed mobility and sit<>stands however pt demo suse of LLE underneath RLE to assist with maneuvering to EOB during bed mobility. DC plan remains appropriate, will follow acutely per POC.   Follow Up Recommendations  CIR    Equipment Recommendations  None recommended by OT    Recommendations for Other Services      Precautions / Restrictions Precautions Precautions: Fall Required Braces or Orthoses: Other Brace;Knee Immobilizer - Right Knee Immobilizer - Right: On at all times;Other (comment) (bledsoe brace) Knee Immobilizer - Left: Other (comment) (L knee brace) Other Brace: RLE bledsoe brace locked in extension; LLE knee brace( no orders for locked extension on LLE) Restrictions Weight Bearing Restrictions: Yes RLE Weight Bearing: Weight bearing as tolerated LLE Weight Bearing: Weight bearing as tolerated       Mobility Bed Mobility Overal bed mobility: Needs Assistance Bed Mobility: Supine to Sit     Supine to sit: Min assist     General bed mobility comments: MIN A to maneuver RLE to EOB and lower to floor, pt able to reach across to bed rail and scoot hips to EOB. pt able to use compensatory method of hooking LLE under RLE to assist with advancing in bed and lowering to floor  Transfers Overall transfer level: Needs  assistance Equipment used: Rolling walker (2 wheeled) Transfers: Sit to/from Stand Sit to Stand: Min guard;Min assist         General transfer comment: min guard- mIN A from EOB- BSC, cues for hand placement as pt with tendency to pulll on RW    Balance Overall balance assessment: Needs assistance Sitting-balance support: No upper extremity supported Sitting balance-Leahy Scale: Good     Standing balance support: During functional activity;No upper extremity supported Standing balance-Leahy Scale: Fair Standing balance comment: able to complete dynamis tasks at sink with no UE support and no LOB                           ADL either performed or assessed with clinical judgement   ADL Overall ADL's : Needs assistance/impaired     Grooming: Wash/dry hands;Wash/dry face;Oral care;Standing;Supervision/safety Grooming Details (indicate cue type and reason): gross supervision to stand at sink and complete UB grooming tasks with RW Upper Body Bathing: Set up;Standing;Supervision/ safety Upper Body Bathing Details (indicate cue type and reason): standing at sink Lower Body Bathing: Supervison/ safety;Set up;Sit to/from stand   Upper Body Dressing : Set up;Sitting       Toilet Transfer: Min guard;BSC;Regular Toilet;RW;Minimal assistance;Ambulation;Comfort height toilet;Cueing for safety Toilet Transfer Details (indicate cue type and reason): BSC over regular toilet ( pt benefits from higher sitting surface d/t needing to keep RLE in extension), minguard to ambulate to toilet, MIN A to manage RLE while descending onto toilet Toileting- Clothing Manipulation and Hygiene: Sit  to/from stand;Supervision/safety       Functional mobility during ADLs: Min guard;Rolling walker General ADL Comments: pt able to complete full standing ADL at sink     Vision       Perception     Praxis      Cognition Arousal/Alertness: Awake/alert Behavior During Therapy: WFL for tasks  assessed/performed Overall Cognitive Status: Within Functional Limits for tasks assessed                                          Exercises     Shoulder Instructions       General Comments      Pertinent Vitals/ Pain       Pain Assessment: 0-10 Pain Score: 1  Pain Location: RLE Pain Descriptors / Indicators: Guarding;Grimacing;Sore Pain Intervention(s): Limited activity within patient's tolerance;Monitored during session;Repositioned  Home Living                                          Prior Functioning/Environment              Frequency  Min 2X/week        Progress Toward Goals  OT Goals(current goals can now be found in the care plan section)  Progress towards OT goals: Progressing toward goals  Acute Rehab OT Goals Patient Stated Goal: Regain independence OT Goal Formulation: With patient Time For Goal Achievement: 11/11/19 Potential to Achieve Goals: Good  Plan Discharge plan remains appropriate;Frequency remains appropriate    Co-evaluation                 AM-PAC OT "6 Clicks" Daily Activity     Outcome Measure   Help from another person eating meals?: None Help from another person taking care of personal grooming?: None Help from another person toileting, which includes using toliet, bedpan, or urinal?: A Little Help from another person bathing (including washing, rinsing, drying)?: A Little Help from another person to put on and taking off regular upper body clothing?: None Help from another person to put on and taking off regular lower body clothing?: A Lot 6 Click Score: 20    End of Session Equipment Utilized During Treatment: Gait belt;Rolling walker;Right knee immobilizer;Other (comment) (L knee brace)  OT Visit Diagnosis: Unsteadiness on feet (R26.81);Other abnormalities of gait and mobility (R26.89);Pain;Muscle weakness (generalized) (M62.81)   Activity Tolerance Patient tolerated treatment  well   Patient Left in chair;with call bell/phone within reach   Nurse Communication Mobility status        Time: 9977-4142 OT Time Calculation (min): 29 min  Charges: OT General Charges $OT Visit: 1 Visit OT Treatments $Self Care/Home Management : 23-37 mins  Lanier Clam., COTA/L Acute Rehabilitation Services [phone]    ZIONAH CRISWELL 11/02/2019, 4:24 PM

## 2019-11-02 NOTE — Progress Notes (Signed)
Physical Therapy Treatment Patient Details Name: April Castillo MRN: [id number] DOB: 01-17-1956 Today's Date: 11/02/2019    History of Present Illness Pt is a 64 y.o. woman admitted 10/27/19 after fall while washing horse that became spooked, pt hit head on fence and fell onto bilateral knees. Sustained bilateral patella fxs, R first finger fx. S/p R patellar ORIF 7/8; non-operative management L knee. PMH includes R hip fx, HTN, HLD, depression.    PT Comments    Pt seated in recliner on arrival.  Pt continues to be very painful on RLE particularly when lowering her leg.  She is good at recalling her knee precautions.  She continues to progress and would greatly benefit from aggressive rehab in a post acute setting to maximize functional gains.     Follow Up Recommendations  CIR;Supervision for mobility/OOB     Equipment Recommendations   (defer to post acute)    Recommendations for Other Services       Precautions / Restrictions Precautions Precautions: Fall Required Braces or Orthoses: Other Brace;Knee Immobilizer - Right Knee Immobilizer - Right: On at all times;Other (comment) Knee Immobilizer - Left: Other (comment) (L hinged knee brace, she can flex L knee for transfers.) Other Brace: RLE bledsoe brace locked in extension; strict orders to keep R knee in extension.  Pt can flex L knee for transfers Restrictions Weight Bearing Restrictions: Yes RLE Weight Bearing: Weight bearing as tolerated LLE Weight Bearing: Weight bearing as tolerated    Mobility  Bed Mobility Overal bed mobility: Needs Assistance Bed Mobility: Sit to Supine     Supine to sit: Min assist Sit to supine: Min assist   General bed mobility comments: MIn assistance to move RLE back to bed.  PTA educated on use of gt belt to self assist.  Pt able to manuever with min assistance.  Transfers Overall transfer level: Needs assistance Equipment used: Rolling walker (2 wheeled) Transfers: Sit to/from  Stand Sit to Stand: Min assist         General transfer comment: Cues for hand placement and foot placement.  Cues for forward weight shifting and hip extension.  Ambulation/Gait Ambulation/Gait assistance: Min assist Gait Distance (Feet): 45 Feet Assistive device: Rolling walker (2 wheeled) Gait Pattern/deviations: Step-to pattern;Decreased weight shift to right;Antalgic Gait velocity: Decreased   General Gait Details: Slow, antalgic steps with RW and minA for stability, pt cued for R knee extension throughout.  Able to progress gt this session.   Stairs             Wheelchair Mobility    Modified Rankin (Stroke Patients Only)       Balance Overall balance assessment: Needs assistance Sitting-balance support: No upper extremity supported Sitting balance-Leahy Scale: Good     Standing balance support: During functional activity;No upper extremity supported Standing balance-Leahy Scale: Fair Standing balance comment: able to complete dynamis tasks at sink with no UE support and no LOB                            Cognition Arousal/Alertness: Awake/alert Behavior During Therapy: WFL for tasks assessed/performed Overall Cognitive Status: Within Functional Limits for tasks assessed                                        Exercises      General Comments  Pertinent Vitals/Pain Pain Assessment: Faces Pain Score: 1  Faces Pain Scale: Hurts even more Pain Location: RLE Pain Descriptors / Indicators: Guarding;Grimacing;Sore Pain Intervention(s): Monitored during session;Repositioned    Home Living                      Prior Function            PT Goals (current goals can now be found in the care plan section) Acute Rehab PT Goals Patient Stated Goal: Regain independence Potential to Achieve Goals: Good Progress towards PT goals: Progressing toward goals    Frequency    Min 5X/week      PT Plan Current  plan remains appropriate    Co-evaluation              AM-PAC PT "6 Clicks" Mobility   Outcome Measure  Help needed turning from your back to your side while in a flat bed without using bedrails?: A Little Help needed moving from lying on your back to sitting on the side of a flat bed without using bedrails?: A Little Help needed moving to and from a bed to a chair (including a wheelchair)?: A Little Help needed standing up from a chair using your arms (e.g., wheelchair or bedside chair)?: A Little Help needed to walk in hospital room?: A Little Help needed climbing 3-5 steps with a railing? : A Lot 6 Click Score: 17    End of Session Equipment Utilized During Treatment: Gait belt Activity Tolerance: Patient tolerated treatment well;Patient limited by pain Patient left: in bed;with call bell/phone within reach Nurse Communication: Mobility status PT Visit Diagnosis: Other abnormalities of gait and mobility (R26.89);Pain Pain - part of body: Knee;Leg (bilateral)     Time: 3159-4585 PT Time Calculation (min) (ACUTE ONLY): 19 min  Charges:  $Gait Training: 8-22 mins                     Erasmo Leventhal , PTA Acute Rehabilitation Services Pager [phone] Office [phone]     April Castillo Eli Hose 11/02/2019, 5:47 PM

## 2019-11-02 NOTE — Progress Notes (Signed)
PROGRESS NOTE    April Castillo  ACZ:[id number] DOB: Sep 15, 1955 DOA: 10/27/2019 PCP: Gifford Shave, MD    Brief Narrative:  64 year old female with history of hypertension and hyperlipidemia admitted to the hospital with mechanical fall and bilateral knee pain.  Skeletal survey consistent with right patella fracture and admitted for surgery. She was handling horse, fell on the front concrete with both knees and right hand. This is her third injury in her life related to horse riding. 7/12, waiting to go to acute inpatient rehab.  No bowel movement since being in the hospital.  Assessment & Plan:   Principal Problem:   Closed patellar sleeve fracture of right knee Active Problems:   Depression   Essential hypertension   Hyperlipidemia   Finger fracture, right   Intractable pain  Closed traumatic patella fracture of the right knee: Adequate pain medications.  Status post ORIF, day 3 postop.  Surgically stable as per surgery.  Postop pain management.  Waiting to have bed available at [facility]. Aggressive bowel regimen with Senokot.  Closed traumatic patellar fracture of the left knee with no displacement: Suggested weightbearing as tolerated and pain medications.  Right index finger fracture: Immobilized with splint.  Hypertension: Well-controlled on lisinopril continue.  Hyperlipidemia: On rosuvastatin.  Depression: On fluoxetine that is continued.   DVT prophylaxis: SCDs Start: 10/29/19 1657 Place TED hose Start: 10/29/19 1657 enoxaparin (LOVENOX) injection 40 mg Start: 10/29/19 1000   Code Status: Full code Family Communication: None today. Disposition Plan: Status is: Inpatient  Remains inpatient appropriate because:Inpatient level of care appropriate due to severity of illness   Dispo: The patient is from: Home              Anticipated d/c is to: Acute inpatient rehab.              Anticipated d/c date is: When bed available.              Patient currently is  medically stable to transfer to rehab level of care.   Consultants:   Orthopedics  Procedures:   ORIF right knee, 7/8  Antimicrobials:   None   Subjective: Seen and examined.  Some pain in the bilateral knees.  No bowel movement for 5 days but no abdominal pain or distention.  Waiting to go to CIR.  Objective: Vitals:   11/01/19 1922 11/02/19 0332 11/02/19 0500 11/02/19 0826  BP: (!) 141/91 133/64  120/63  Pulse: 84 75  72  Resp: 20 17  16   Temp: 98.7 F (37.1 C) 98.3 F (36.8 C)  98.4 F (36.9 C)  TempSrc: Oral Oral  Oral  SpO2: 99% 96%  98%  Weight:   75.1 kg   Height:       No intake or output data in the 24 hours ending 11/02/19 1059 Filed Weights   10/29/19 1244 10/31/19 0500 11/02/19 0500  Weight: 76.5 kg 75.4 kg 75.1 kg    Examination:  Physical Exam HENT:     Head: Atraumatic.  Pulmonary:     Breath sounds: Normal breath sounds.  Neurological:     General: No focal deficit present.     Mental Status: She is alert and oriented to person, place, and time.    She looks comfortable.   Both knee with minimal swelling.  Distal neurovascular status intact. Abdomen is soft and nontender.  Bowel sounds present.   Data Reviewed: I have personally reviewed following labs and imaging studies  CBC: Recent Labs  Lab 10/28/19 0423 10/28/19 0423 10/29/19 0405 10/30/19 0706 10/31/19 0458 11/01/19 0301 11/02/19 0301  WBC 6.7   < > 6.4 6.9 10.6* 7.2 6.5  NEUTROABS 4.6  --   --   --   --   --   --   HGB 11.2*   < > 10.3* 9.2* 10.2* 10.4* 10.1*  HCT 35.2*   < > 32.9* 28.3* 31.7* 31.6* 31.7*  MCV 97.8   < > 100.0 98.3 98.4 97.8 97.5  PLT 208   < > 199 174 212 223 240   < > = values in this interval not displayed.   Basic Metabolic Panel: Recent Labs  Lab 10/28/19 0423 10/28/19 0423 10/29/19 0405 10/30/19 0706 10/31/19 0458 11/01/19 0301 11/02/19 0301  NA 139   < > 140 137 141 142 140  K 4.1   < > 4.2 4.0 3.6 3.6 3.8  CL 104   < > 106 104 106  106 106  CO2 25   < > 27 25 25 26 25   GLUCOSE 108*   < > 102* 128* 121* 119* 110*  BUN 20   < > 25* 12 11 9 22   CREATININE 0.80   < > 0.77 0.67 0.80 0.67 0.71  CALCIUM 9.0   < > 8.7* 8.4* 8.5* 8.8* 8.8*  MG 2.3  --   --   --   --   --   --    < > = values in this interval not displayed.   GFR: Estimated Creatinine Clearance: 72.6 mL/min (by C-G formula based on SCr of 0.71 mg/dL). Liver Function Tests: No results for input(s): AST, ALT, ALKPHOS, BILITOT, PROT, ALBUMIN in the last 168 hours. No results for input(s): LIPASE, AMYLASE in the last 168 hours. No results for input(s): AMMONIA in the last 168 hours. Coagulation Profile: No results for input(s): INR, PROTIME in the last 168 hours. Cardiac Enzymes: No results for input(s): CKTOTAL, CKMB, CKMBINDEX, TROPONINI in the last 168 hours. BNP (last 3 results) No results for input(s): PROBNP in the last 8760 hours. HbA1C: No results for input(s): HGBA1C in the last 72 hours. CBG: No results for input(s): GLUCAP in the last 168 hours. Lipid Profile: No results for input(s): CHOL, HDL, LDLCALC, TRIG, CHOLHDL, LDLDIRECT in the last 72 hours. Thyroid Function Tests: No results for input(s): TSH, T4TOTAL, FREET4, T3FREE, THYROIDAB in the last 72 hours. Anemia Panel: No results for input(s): VITAMINB12, FOLATE, FERRITIN, TIBC, IRON, RETICCTPCT in the last 72 hours. Sepsis Labs: No results for input(s): PROCALCITON, LATICACIDVEN in the last 168 hours.  Recent Results (from the past 240 hour(s))  SARS Coronavirus 2 by RT PCR (hospital order, performed in [facility] hospital lab) Nasopharyngeal Nasopharyngeal Swab     Status: None   Collection Time: 10/27/19 11:07 PM   Specimen: Nasopharyngeal Swab  Result Value Ref Range Status   SARS Coronavirus 2 NEGATIVE NEGATIVE Final    Comment: (NOTE) SARS-CoV-2 target nucleic acids are NOT DETECTED.  The SARS-CoV-2 RNA is generally detectable in upper and lower respiratory specimens during  the acute phase of infection. The lowest concentration of SARS-CoV-2 viral copies this assay can detect is 250 copies / mL. A negative result does not preclude SARS-CoV-2 infection and should not be used as the sole basis for treatment or other patient management decisions.  A negative result may occur with improper specimen collection / handling, submission of specimen other than nasopharyngeal swab, presence of viral mutation(s) within the areas targeted  by this assay, and inadequate number of viral copies (<250 copies / mL). A negative result must be combined with clinical observations, patient history, and epidemiological information.  Fact Sheet for Patients:   StrictlyIdeas.no  Fact Sheet for Healthcare Providers: BankingDealers.co.za  This test is not yet approved or  cleared by the [place] FDA and has been authorized for detection and/or diagnosis of SARS-CoV-2 by FDA under an Emergency Use Authorization (EUA).  This EUA will remain in effect (meaning this test can be used) for the duration of the COVID-19 declaration under Section 564(b)(1) of the Act, 21 U.S.C. section 360bbb-3(b)(1), unless the authorization is terminated or revoked sooner.  Performed at [facility], [place] [address] [place], [place] [address]   Surgical pcr screen     Status: Abnormal   Collection Time: 10/28/19  3:15 AM   Specimen: Nasal Mucosa; Nasal Swab  Result Value Ref Range Status   MRSA, PCR NEGATIVE NEGATIVE Final   Staphylococcus aureus POSITIVE (A) NEGATIVE Final    Comment: (NOTE) The Xpert SA Assay (FDA approved for NASAL specimens in patients 34 years of age and older), is one component of a comprehensive surveillance program. It is not intended to diagnose infection nor to guide or monitor treatment. Performed at [facility], [place] [address] [place], [place] [address]          Radiology Studies: No results  found.      Scheduled Meds:  aspirin EC  81 mg Oral Daily   Chlorhexidine Gluconate Cloth  6 each Topical Daily   docusate sodium  100 mg Oral BID   enoxaparin (LOVENOX) injection  40 mg Subcutaneous Daily   FLUoxetine  20 mg Oral Daily   lisinopril  10 mg Oral Daily   mupirocin ointment  1 application Nasal BID   polyethylene glycol  17 g Oral Daily   rosuvastatin  20 mg Oral Daily   sodium chloride flush  3 mL Intravenous Q12H   Continuous Infusions:  methocarbamol (ROBAXIN) IV       LOS: 5 days    Time spent: 20 minutes    Barb Merino, MD [facility] Pager [phone]

## 2019-11-03 MED ORDER — MAGNESIUM CITRATE PO SOLN
0.5000 | Freq: Once | ORAL | Status: DC
  Filled 2019-11-03: qty 296

## 2019-11-03 NOTE — Plan of Care (Signed)

## 2019-11-03 NOTE — Progress Notes (Signed)
Physical Therapy Treatment Patient Details Name: April Castillo MRN: [id number] DOB: Oct 26, 1955 Today's Date: 11/03/2019    History of Present Illness Pt is a 64 y.o. woman admitted 10/27/19 after fall while washing horse that became spooked, pt hit head on fence and fell onto bilateral knees. Sustained bilateral patella fxs, R first finger fx. S/p R patellar ORIF 7/8; non-operative management L knee. PMH includes R hip fx, HTN, HLD, depression.    PT Comments    Pt supine in bed and eager to move OOB to mobilize,  Pt continues to be limited with RLE and requires support to move out of bed and into recliner in extended beldsoe brace.  She currenly functions at min guard to min assistance.  She was able to increase gt distance this am. Continue to recommend aggressive rehab at CIR to return home independently.      Follow Up Recommendations  CIR;Supervision for mobility/OOB     Equipment Recommendations       Recommendations for Other Services       Precautions / Restrictions Precautions Precautions: Fall Required Braces or Orthoses: Other Brace;Knee Immobilizer - Right Knee Immobilizer - Right: On at all times;Other (comment) Knee Immobilizer - Left: Other (comment) (L hinged knee brace and can flex L knee for transfers.) Other Brace: RLE bledsoe brace locked in extension; strict orders to keep R knee in extension.  Pt can flex L knee for transfers Restrictions Weight Bearing Restrictions: Yes RLE Weight Bearing: Weight bearing as tolerated LLE Weight Bearing: Weight bearing as tolerated    Mobility  Bed Mobility Overal bed mobility: Needs Assistance Bed Mobility: Sit to Supine;Supine to Sit     Supine to sit: Min assist Sit to supine: Min assist   General bed mobility comments: Min assistance to move RLE to edge of bed.  She attempted to use gt belt but could not manage due to pain and required min assistance.  PTA assisted raising RLE to move back into  recliner.  Transfers Overall transfer level: Needs assistance Equipment used: Rolling walker (2 wheeled) Transfers: Sit to/from Stand Sit to Stand: Min assist;From elevated surface         General transfer comment: Cues for hand placement and foot placement.  Cues for forward weight shifting and hip extension to rise into standing.  Ambulation/Gait Ambulation/Gait assistance: Min guard Gait Distance (Feet): 80 Feet Assistive device: Rolling walker (2 wheeled) Gait Pattern/deviations: Step-to pattern;Decreased weight shift to right;Antalgic;Decreased stance time - right     General Gait Details: Slow, antalgic steps with RW and min guard A for stability, pt cued for R knee extension throughout. Cues for soft landing on LLE to improve gt symmetry   Stairs             Wheelchair Mobility    Modified Rankin (Stroke Patients Only)       Balance Overall balance assessment: Needs assistance Sitting-balance support: No upper extremity supported Sitting balance-Leahy Scale: Good       Standing balance-Leahy Scale: Fair Standing balance comment: able to complete dynamis tasks at sink with no UE support and no LOB                            Cognition Arousal/Alertness: Awake/alert Behavior During Therapy: WFL for tasks assessed/performed Overall Cognitive Status: Within Functional Limits for tasks assessed  Exercises      General Comments        Pertinent Vitals/Pain Pain Assessment: Faces Faces Pain Scale: Hurts even more Pain Location: RLE Pain Descriptors / Indicators: Guarding;Grimacing;Sore Pain Intervention(s): Monitored during session;Repositioned    Home Living                      Prior Function            PT Goals (current goals can now be found in the care plan section) Acute Rehab PT Goals Patient Stated Goal: Regain independence Potential to Achieve Goals:  Good Progress towards PT goals: Progressing toward goals    Frequency    Min 5X/week      PT Plan Current plan remains appropriate    Co-evaluation              AM-PAC PT "6 Clicks" Mobility   Outcome Measure  Help needed turning from your back to your side while in a flat bed without using bedrails?: A Little Help needed moving from lying on your back to sitting on the side of a flat bed without using bedrails?: A Little Help needed moving to and from a bed to a chair (including a wheelchair)?: A Little Help needed standing up from a chair using your arms (e.g., wheelchair or bedside chair)?: A Little Help needed to walk in hospital room?: A Little Help needed climbing 3-5 steps with a railing? : A Little 6 Click Score: 18    End of Session Equipment Utilized During Treatment: Gait belt Activity Tolerance: Patient tolerated treatment well;Patient limited by pain Patient left: in bed;with call bell/phone within reach Nurse Communication: Mobility status PT Visit Diagnosis: Other abnormalities of gait and mobility (R26.89);Pain Pain - part of body: Knee;Leg (bilateral)     Time: 2563-8937 PT Time Calculation (min) (ACUTE ONLY): 22 min  Charges:  $Gait Training: 8-22 mins                     Erasmo Leventhal , PTA Acute Rehabilitation Services Pager [phone] Office [phone]       April Castillo 11/03/2019, 11:14 AM

## 2019-11-03 NOTE — Progress Notes (Signed)
Inpatient Rehabilitation-Admissions Coordinator   Following up for my coworker Scherrie Gerlach. Insurance authorization process was initiated on 7/12 for possible CIR admit. Will update once there has been a determination.   Raechel Ache, OTR/L  Rehab Admissions Coordinator  [phone] 11/03/2019 8:55 AM

## 2019-11-03 NOTE — Progress Notes (Signed)
PROGRESS NOTE    April Castillo  TDD:[id number] DOB: 08/05/1955 DOA: 10/27/2019 PCP: Gifford Shave, MD    Brief Narrative:  64 year old female with history of hypertension and hyperlipidemia admitted to the hospital with mechanical fall and bilateral knee pain.  Skeletal survey consistent with right patella fracture and admitted for surgery. She was handling horse, fell on the front concrete with both knees and right hand. This is her third injury in her life related to horse riding. 7/12, waiting to go to acute inpatient rehab.  No bowel movement since being in the hospital.  Assessment & Plan:   Principal Problem:   Closed patellar sleeve fracture of right knee Active Problems:   Depression   Essential hypertension   Hyperlipidemia   Finger fracture, right   Intractable pain  Closed traumatic patella fracture of the right knee: Adequate pain medications.  Status post ORIF.  Surgically stable as per surgery.  Postop pain management with oral opiates and Robaxin. Waiting to have bed available at CIR. Aggressive bowel regimen with Senokot. Additional magnesium citrate today.  Closed traumatic patellar fracture of the left knee with no displacement: Suggested weightbearing as tolerated and pain medications.  Right index finger fracture: Immobilized with splint.  Hypertension: Well-controlled on lisinopril continue.  Hyperlipidemia: On rosuvastatin.  Depression: On fluoxetine that is continued.   DVT prophylaxis: SCDs Start: 10/29/19 1657 Place TED hose Start: 10/29/19 1657 enoxaparin (LOVENOX) injection 40 mg Start: 10/29/19 1000   Code Status: Full code Family Communication: None today. Disposition Plan: Status is: Inpatient  Remains inpatient appropriate because:Inpatient level of care appropriate due to severity of illness   Dispo: The patient is from: Home              Anticipated d/c is to: Acute inpatient rehab.              Anticipated d/c date is: When  bed available.              Patient currently is medically stable to transfer to rehab level of care.   Consultants:   Orthopedics  Procedures:   ORIF right knee, 7/8  Antimicrobials:   None   Subjective: Seen and examined.  No issues.  No bowel movement yet but no distention or nausea.  Pain controlled with oral pain medications.  Objective: Vitals:   11/02/19 1459 11/02/19 1925 11/03/19 0548 11/03/19 0737  BP: 120/66 (!) 117/57 118/64 (!) 147/64  Pulse: 75 88 87 77  Resp: 17 16 16 17   Temp: 98.5 F (36.9 C) 98.1 F (36.7 C) 98.2 F (36.8 C) 97.9 F (36.6 C)  TempSrc: Oral   Oral  SpO2: 98% 96% 100% 97%  Weight:      Height:        Intake/Output Summary (Last 24 hours) at 11/03/2019 1138 Last data filed at 11/03/2019 0900 Gross per 24 hour  Intake 720 ml  Output --  Net 720 ml   Filed Weights   10/29/19 1244 10/31/19 0500 11/02/19 0500  Weight: 76.5 kg 75.4 kg 75.1 kg    Examination:  Physical Exam HENT:     Head: Atraumatic.  Pulmonary:     Breath sounds: Normal breath sounds.  Neurological:     General: No focal deficit present.     Mental Status: She is alert and oriented to person, place, and time.    She looks comfortable.   Both knee with minimal swelling.  Distal neurovascular status intact. Abdomen is soft and nontender.  Bowel sounds present.   Data Reviewed: I have personally reviewed following labs and imaging studies  CBC: Recent Labs  Lab 10/28/19 0423 10/28/19 0423 10/29/19 0405 10/30/19 0706 10/31/19 0458 11/01/19 0301 11/02/19 0301  WBC 6.7   < > 6.4 6.9 10.6* 7.2 6.5  NEUTROABS 4.6  --   --   --   --   --   --   HGB 11.2*   < > 10.3* 9.2* 10.2* 10.4* 10.1*  HCT 35.2*   < > 32.9* 28.3* 31.7* 31.6* 31.7*  MCV 97.8   < > 100.0 98.3 98.4 97.8 97.5  PLT 208   < > 199 174 212 223 240   < > = values in this interval not displayed.   Basic Metabolic Panel: Recent Labs  Lab 10/28/19 0423 10/28/19 0423 10/29/19 0405  10/30/19 0706 10/31/19 0458 11/01/19 0301 11/02/19 0301  NA 139   < > 140 137 141 142 140  K 4.1   < > 4.2 4.0 3.6 3.6 3.8  CL 104   < > 106 104 106 106 106  CO2 25   < > 27 25 25 26 25   GLUCOSE 108*   < > 102* 128* 121* 119* 110*  BUN 20   < > 25* 12 11 9 22   CREATININE 0.80   < > 0.77 0.67 0.80 0.67 0.71  CALCIUM 9.0   < > 8.7* 8.4* 8.5* 8.8* 8.8*  MG 2.3  --   --   --   --   --   --    < > = values in this interval not displayed.   GFR: Estimated Creatinine Clearance: 72.6 mL/min (by C-G formula based on SCr of 0.71 mg/dL). Liver Function Tests: No results for input(s): AST, ALT, ALKPHOS, BILITOT, PROT, ALBUMIN in the last 168 hours. No results for input(s): LIPASE, AMYLASE in the last 168 hours. No results for input(s): AMMONIA in the last 168 hours. Coagulation Profile: No results for input(s): INR, PROTIME in the last 168 hours. Cardiac Enzymes: No results for input(s): CKTOTAL, CKMB, CKMBINDEX, TROPONINI in the last 168 hours. BNP (last 3 results) No results for input(s): PROBNP in the last 8760 hours. HbA1C: No results for input(s): HGBA1C in the last 72 hours. CBG: No results for input(s): GLUCAP in the last 168 hours. Lipid Profile: No results for input(s): CHOL, HDL, LDLCALC, TRIG, CHOLHDL, LDLDIRECT in the last 72 hours. Thyroid Function Tests: No results for input(s): TSH, T4TOTAL, FREET4, T3FREE, THYROIDAB in the last 72 hours. Anemia Panel: No results for input(s): VITAMINB12, FOLATE, FERRITIN, TIBC, IRON, RETICCTPCT in the last 72 hours. Sepsis Labs: No results for input(s): PROCALCITON, LATICACIDVEN in the last 168 hours.  Recent Results (from the past 240 hour(s))  SARS Coronavirus 2 by RT PCR (hospital order, performed in [facility] hospital lab) Nasopharyngeal Nasopharyngeal Swab     Status: None   Collection Time: 10/27/19 11:07 PM   Specimen: Nasopharyngeal Swab  Result Value Ref Range Status   SARS Coronavirus 2 NEGATIVE NEGATIVE Final     Comment: (NOTE) SARS-CoV-2 target nucleic acids are NOT DETECTED.  The SARS-CoV-2 RNA is generally detectable in upper and lower respiratory specimens during the acute phase of infection. The lowest concentration of SARS-CoV-2 viral copies this assay can detect is 250 copies / mL. A negative result does not preclude SARS-CoV-2 infection and should not be used as the sole basis for treatment or other patient management decisions.  A negative result may occur  with improper specimen collection / handling, submission of specimen other than nasopharyngeal swab, presence of viral mutation(s) within the areas targeted by this assay, and inadequate number of viral copies (<250 copies / mL). A negative result must be combined with clinical observations, patient history, and epidemiological information.  Fact Sheet for Patients:   StrictlyIdeas.no  Fact Sheet for Healthcare Providers: BankingDealers.co.za  This test is not yet approved or  cleared by the [place] FDA and has been authorized for detection and/or diagnosis of SARS-CoV-2 by FDA under an Emergency Use Authorization (EUA).  This EUA will remain in effect (meaning this test can be used) for the duration of the COVID-19 declaration under Section 564(b)(1) of the Act, 21 U.S.C. section 360bbb-3(b)(1), unless the authorization is terminated or revoked sooner.  Performed at [facility], [place] [address] [place], [place] [address]   Surgical pcr screen     Status: Abnormal   Collection Time: 10/28/19  3:15 AM   Specimen: Nasal Mucosa; Nasal Swab  Result Value Ref Range Status   MRSA, PCR NEGATIVE NEGATIVE Final   Staphylococcus aureus POSITIVE (A) NEGATIVE Final    Comment: (NOTE) The Xpert SA Assay (FDA approved for NASAL specimens in patients 86 years of age and older), is one component of a comprehensive surveillance program. It is not intended to diagnose infection nor  to guide or monitor treatment. Performed at [facility], [place] [address], [place], [place] [address]          Radiology Studies: No results found.      Scheduled Meds: . aspirin EC  81 mg Oral Daily  . enoxaparin (LOVENOX) injection  40 mg Subcutaneous Daily  . FLUoxetine  20 mg Oral Daily  . lisinopril  10 mg Oral Daily  . magnesium citrate  0.5 Bottle Oral Once  . rosuvastatin  20 mg Oral Daily  . senna-docusate  1 tablet Oral QHS  . sodium chloride flush  3 mL Intravenous Q12H   Continuous Infusions:    LOS: 6 days    Time spent: 20 minutes    Barb Merino, MD [facility] Pager [phone]

## 2019-11-04 NOTE — Progress Notes (Signed)
Physical Therapy Treatment Patient Details Name: April Castillo MRN: [id number] DOB: 03/16/1956 Today's Date: 11/04/2019    History of Present Illness Pt is a 64 y.o. woman admitted 10/27/19 after fall while washing horse that became spooked, pt hit head on fence and fell onto bilateral knees. Sustained bilateral patella fxs, R first finger fx. S/p R patellar ORIF 7/8; non-operative management L knee. PMH includes R hip fx, HTN, HLD, depression.    PT Comments    Pt supine in bed on arrival this session.  Pt reports her knees were more sore after yesterday's session so ice packs applied to B knees.  Pt continues to improve in regards to gt training but remains limited during transfer activities due to pain and decreased strength.  Pt required min assistance to lower and manage R leg.   She continues to benefit from aggressive rehab in a post acute setting to improve strength and function before returning home.     Follow Up Recommendations  CIR;Supervision for mobility/OOB     Equipment Recommendations  Rolling walker with 5" wheels;3in1 (PT)    Recommendations for Other Services       Precautions / Restrictions Precautions Precautions: Fall Required Braces or Orthoses: Other Brace;Knee Immobilizer - Right Knee Immobilizer - Right: On at all times;Other (comment) Knee Immobilizer - Left: Other (comment) (L hinged knee brace can flex knees for transfers.) Other Brace: RLE bledsoe brace locked in extension; strict orders to keep R knee in extension.  Pt can flex L knee for transfers Restrictions Weight Bearing Restrictions: Yes RLE Weight Bearing: Weight bearing as tolerated LLE Weight Bearing: Weight bearing as tolerated    Mobility  Bed Mobility Overal bed mobility: Needs Assistance Bed Mobility: Supine to Sit;Sit to Supine     Supine to sit: Min assist Sit to supine: Min assist   General bed mobility comments: Min assistance to move RLE to edge of bed.  She is getting  better utilzing gt belt for support but continues to require min assistance when scooting to edge of bed or lowering legs from a recliner.  Transfers Overall transfer level: Needs assistance Equipment used: Rolling walker (2 wheeled) Transfers: Sit to/from Stand Sit to Stand: From elevated surface;Min assist         General transfer comment: Cues for hand placement and foot placement.  requires assistance to move RLE forward due to pain when sitting or standing.  Ambulation/Gait Ambulation/Gait assistance: Supervision Gait Distance (Feet): 60 Feet Assistive device: Rolling walker (2 wheeled) Gait Pattern/deviations: Step-to pattern;Decreased weight shift to right;Antalgic;Decreased stance time - right     General Gait Details: Pt with slow guarded gt due to pain.  She required decreased assistance once standing upright.   Stairs             Wheelchair Mobility    Modified Rankin (Stroke Patients Only)       Balance Overall balance assessment: Needs assistance Sitting-balance support: No upper extremity supported Sitting balance-Leahy Scale: Good     Standing balance support: During functional activity;No upper extremity supported Standing balance-Leahy Scale: Fair                              Cognition Arousal/Alertness: Awake/alert Behavior During Therapy: WFL for tasks assessed/performed Overall Cognitive Status: Within Functional Limits for tasks assessed  Exercises Total Joint Exercises Hip ABduction/ADduction: AROM;Right;10 reps;Standing Standing Hip Extension: AROM;Right;10 reps;Standing General Exercises - Lower Extremity Ankle Circles/Pumps: AROM;Left;10 reps;Supine Quad Sets: AROM;Left;10 reps;Supine Heel Slides: AROM;Left;10 reps;Supine Hip ABduction/ADduction: AROM;Left;10 reps;Supine    General Comments        Pertinent Vitals/Pain Pain Assessment: Faces Faces Pain  Scale: Hurts even more Pain Location: RLE>LLE Pain Descriptors / Indicators: Guarding;Grimacing;Sore Pain Intervention(s): Monitored during session;Repositioned    Home Living                      Prior Function            PT Goals (current goals can now be found in the care plan section) Acute Rehab PT Goals Patient Stated Goal: Regain independence Potential to Achieve Goals: Good Progress towards PT goals: Progressing toward goals    Frequency    Min 5X/week      PT Plan Current plan remains appropriate    Co-evaluation              AM-PAC PT "6 Clicks" Mobility   Outcome Measure  Help needed turning from your back to your side while in a flat bed without using bedrails?: A Little Help needed moving from lying on your back to sitting on the side of a flat bed without using bedrails?: A Little Help needed moving to and from a bed to a chair (including a wheelchair)?: A Little Help needed standing up from a chair using your arms (e.g., wheelchair or bedside chair)?: A Little Help needed to walk in hospital room?: A Little Help needed climbing 3-5 steps with a railing? : A Little 6 Click Score: 18    End of Session Equipment Utilized During Treatment: Gait belt Activity Tolerance: Patient tolerated treatment well;Patient limited by pain Patient left: in bed;with call bell/phone within reach Nurse Communication: Mobility status PT Visit Diagnosis: Other abnormalities of gait and mobility (R26.89);Pain Pain - part of body: Knee;Leg (bilateral)     Time: 6440-3474 PT Time Calculation (min) (ACUTE ONLY): 30 min  Charges:  $Gait Training: 8-22 mins $Therapeutic Exercise: 8-22 mins                     Erasmo Leventhal , PTA Acute Rehabilitation Services Pager [phone] Office [phone]     Caswell Alvillar Eli Hose 11/04/2019, 3:26 PM

## 2019-11-04 NOTE — Progress Notes (Signed)
PROGRESS NOTE    April Castillo  FAO:[id number] DOB: 1955-05-28 DOA: 10/27/2019 PCP: Gifford Shave, MD    Brief Narrative:  64 year old female with history of hypertension and hyperlipidemia admitted to the hospital with mechanical fall and bilateral knee pain.  Skeletal survey consistent with right patella fracture and admitted for surgery. She was handling horse, fell on the front concrete with both knees and right hand. This is her third injury in her life related to horse riding. 7/12, waiting to go to acute inpatient rehab.    Assessment & Plan:   Principal Problem:   Closed patellar sleeve fracture of right knee Active Problems:   Depression   Essential hypertension   Hyperlipidemia   Finger fracture, right   Intractable pain  Closed traumatic patella fracture of the right knee: Adequate pain medications.  Status post ORIF.  Surgically stable as per surgery.  Postop pain management with oral opiates and Robaxin. Waiting to have bed available at CIR. Use Senokot.  Had bowel movement with magnesium citrate.  Closed traumatic patellar fracture of the left knee with no displacement: Suggested weightbearing as tolerated and pain medications.  Right index finger fracture: Immobilized with splint.  Hypertension: Well-controlled on lisinopril continue.  Hyperlipidemia: On rosuvastatin.  Depression: On fluoxetine that is continued.   DVT prophylaxis: SCDs Start: 10/29/19 1657 Place TED hose Start: 10/29/19 1657 enoxaparin (LOVENOX) injection 40 mg Start: 10/29/19 1000   Code Status: Full code Family Communication: None today. Disposition Plan: Status is: Inpatient  Remains inpatient appropriate because:Inpatient level of care appropriate due to severity of illness   Dispo: The patient is from: Home              Anticipated d/c is to: Acute inpatient rehab.              Anticipated d/c date is: When bed available.              Patient currently is medically  stable to transfer to rehab level of care.   Consultants:   Orthopedics  Procedures:   ORIF right knee, 7/8  Antimicrobials:   None   Subjective: Patient seen and examined. No overnight events. Had BM after Mag citrate. Some pain knees after working with PT yesterday.  Objective: Vitals:   11/03/19 1516 11/03/19 2002 11/04/19 0320 11/04/19 0837  BP: (!) 103/54 (!) 126/41 121/60 (!) 112/58  Pulse: 80 81 72 70  Resp: 16 17 16 18   Temp: 97.8 F (36.6 C) 98.5 F (36.9 C) 98.3 F (36.8 C) 98.5 F (36.9 C)  TempSrc: Oral Oral Oral Oral  SpO2: 98% 98% 97% 97%  Weight:      Height:        Intake/Output Summary (Last 24 hours) at 11/04/2019 1127 Last data filed at 11/03/2019 2040 Gross per 24 hour  Intake 840 ml  Output --  Net 840 ml   Filed Weights   10/29/19 1244 10/31/19 0500 11/02/19 0500  Weight: 76.5 kg 75.4 kg 75.1 kg    Examination:  Physical Exam Constitutional:      Appearance: Normal appearance.  Cardiovascular:     Rate and Rhythm: Normal rate.  Abdominal:     General: Bowel sounds are normal.     Palpations: Abdomen is soft.  Musculoskeletal:     Comments: Right knee on hinged brace, postop incisions clean and dry with minimal swelling. Left knee with minimal swelling. Right index finger on splint.  Neurological:     Mental Status:  She is alert.        Data Reviewed: I have personally reviewed following labs and imaging studies  CBC: Recent Labs  Lab 10/29/19 0405 10/30/19 0706 10/31/19 0458 11/01/19 0301 11/02/19 0301  WBC 6.4 6.9 10.6* 7.2 6.5  HGB 10.3* 9.2* 10.2* 10.4* 10.1*  HCT 32.9* 28.3* 31.7* 31.6* 31.7*  MCV 100.0 98.3 98.4 97.8 97.5  PLT 199 174 212 223 078   Basic Metabolic Panel: Recent Labs  Lab 10/29/19 0405 10/30/19 0706 10/31/19 0458 11/01/19 0301 11/02/19 0301  NA 140 137 141 142 140  K 4.2 4.0 3.6 3.6 3.8  CL 106 104 106 106 106  CO2 27 25 25 26 25   GLUCOSE 102* 128* 121* 119* 110*  BUN 25* 12 11  9 22   CREATININE 0.77 0.67 0.80 0.67 0.71  CALCIUM 8.7* 8.4* 8.5* 8.8* 8.8*   GFR: Estimated Creatinine Clearance: 72.6 mL/min (by C-G formula based on SCr of 0.71 mg/dL). Liver Function Tests: No results for input(s): AST, ALT, ALKPHOS, BILITOT, PROT, ALBUMIN in the last 168 hours. No results for input(s): LIPASE, AMYLASE in the last 168 hours. No results for input(s): AMMONIA in the last 168 hours. Coagulation Profile: No results for input(s): INR, PROTIME in the last 168 hours. Cardiac Enzymes: No results for input(s): CKTOTAL, CKMB, CKMBINDEX, TROPONINI in the last 168 hours. BNP (last 3 results) No results for input(s): PROBNP in the last 8760 hours. HbA1C: No results for input(s): HGBA1C in the last 72 hours. CBG: No results for input(s): GLUCAP in the last 168 hours. Lipid Profile: No results for input(s): CHOL, HDL, LDLCALC, TRIG, CHOLHDL, LDLDIRECT in the last 72 hours. Thyroid Function Tests: No results for input(s): TSH, T4TOTAL, FREET4, T3FREE, THYROIDAB in the last 72 hours. Anemia Panel: No results for input(s): VITAMINB12, FOLATE, FERRITIN, TIBC, IRON, RETICCTPCT in the last 72 hours. Sepsis Labs: No results for input(s): PROCALCITON, LATICACIDVEN in the last 168 hours.  Recent Results (from the past 240 hour(s))  SARS Coronavirus 2 by RT PCR (hospital order, performed in [facility] hospital lab) Nasopharyngeal Nasopharyngeal Swab     Status: None   Collection Time: 10/27/19 11:07 PM   Specimen: Nasopharyngeal Swab  Result Value Ref Range Status   SARS Coronavirus 2 NEGATIVE NEGATIVE Final    Comment: (NOTE) SARS-CoV-2 target nucleic acids are NOT DETECTED.  The SARS-CoV-2 RNA is generally detectable in upper and lower respiratory specimens during the acute phase of infection. The lowest concentration of SARS-CoV-2 viral copies this assay can detect is 250 copies / mL. A negative result does not preclude SARS-CoV-2 infection and should not be used as the sole  basis for treatment or other patient management decisions.  A negative result may occur with improper specimen collection / handling, submission of specimen other than nasopharyngeal swab, presence of viral mutation(s) within the areas targeted by this assay, and inadequate number of viral copies (<250 copies / mL). A negative result must be combined with clinical observations, patient history, and epidemiological information.  Fact Sheet for Patients:   StrictlyIdeas.no  Fact Sheet for Healthcare Providers: BankingDealers.co.za  This test is not yet approved or  cleared by the [place] FDA and has been authorized for detection and/or diagnosis of SARS-CoV-2 by FDA under an Emergency Use Authorization (EUA).  This EUA will remain in effect (meaning this test can be used) for the duration of the COVID-19 declaration under Section 564(b)(1) of the Act, 21 U.S.C. section 360bbb-3(b)(1), unless the authorization is terminated or  revoked sooner.  Performed at [facility], [place] [address], [place], [place] [address]   Surgical pcr screen     Status: Abnormal   Collection Time: 10/28/19  3:15 AM   Specimen: Nasal Mucosa; Nasal Swab  Result Value Ref Range Status   MRSA, PCR NEGATIVE NEGATIVE Final   Staphylococcus aureus POSITIVE (A) NEGATIVE Final    Comment: (NOTE) The Xpert SA Assay (FDA approved for NASAL specimens in patients 39 years of age and older), is one component of a comprehensive surveillance program. It is not intended to diagnose infection nor to guide or monitor treatment. Performed at [facility], [place] [address], [place], [place] [address]          Radiology Studies: No results found.      Scheduled Meds: . aspirin EC  81 mg Oral Daily  . enoxaparin (LOVENOX) injection  40 mg Subcutaneous Daily  . FLUoxetine  20 mg Oral Daily  . lisinopril  10 mg Oral Daily  . magnesium citrate  0.5  Bottle Oral Once  . rosuvastatin  20 mg Oral Daily  . senna-docusate  1 tablet Oral QHS  . sodium chloride flush  3 mL Intravenous Q12H   Continuous Infusions:    LOS: 7 days    Time spent: 20 minutes    Barb Merino, MD [facility] Pager [phone]

## 2019-11-04 NOTE — Plan of Care (Signed)

## 2019-11-04 NOTE — Progress Notes (Signed)
Inpatient Rehabilitation-Admissions Coordinator   Still waiting to hear from insurance regarding request for CIR.   Please note, I do not have a bed available for this patient today, even if I was to receive an approval today.   Will follow up tomorrow.   Raechel Ache, OTR/L  Rehab Admissions Coordinator  [phone] 11/04/2019 12:11 PM

## 2019-11-04 NOTE — Plan of Care (Signed)

## 2019-11-05 MED ORDER — OXYCODONE-ACETAMINOPHEN 5-325 MG PO TABS: 1.0000 | ORAL_TABLET | Freq: Four times a day (QID) | ORAL | 0 refills | Status: AC | PRN

## 2019-11-05 NOTE — Plan of Care (Signed)
  Problem: Pain Managment: Goal: General experience of comfort will improve Outcome: Progressing   Problem: Safety: Goal: Ability to remain free from injury will improve Outcome: Progressing   

## 2019-11-05 NOTE — Progress Notes (Signed)
PROGRESS NOTE    April Castillo  OFB:[id number] DOB: 20-May-1955 DOA: 10/27/2019 PCP: Gifford Shave, MD    Brief Narrative:  64 year old female with history of hypertension and hyperlipidemia admitted to the hospital with mechanical fall and bilateral knee pain.  Skeletal survey consistent with right patella fracture and admitted for surgery. She was handling horse, fell on the front concrete with both knees and right hand. This is her third injury in her life related to horse riding. 7/12, waiting to go to acute inpatient rehab. 7/15, insurance declined for rehab.  Exploring options.  Assessment & Plan:   Principal Problem:   Closed patellar sleeve fracture of right knee Active Problems:   Depression   Essential hypertension   Hyperlipidemia   Finger fracture, right   Intractable pain  Closed traumatic patella fracture of the right knee: Status post ORIF of the right patella.  Surgically stable as per surgery.  Hinged knee brace and mobility.  Adequate pain medications with bowel regimen. Patient is a excellent candidate for acute inpatient rehab, however her insurance company declined.  Closed traumatic patellar fracture of the left knee with no displacement: Suggested weightbearing as tolerated and pain medications.  Right index finger fracture: Immobilized with splint.  Hypertension: Well-controlled on lisinopril continue.  Hyperlipidemia: On rosuvastatin.  Depression: On fluoxetine that is continued.  Patient excellent candidate for acute inpatient rehab, peer to peer review done with insurance provider physician and they declined stating that patient does not have medical problems.  DVT prophylaxis: SCDs Start: 10/29/19 1657 Place TED hose Start: 10/29/19 1657 enoxaparin (LOVENOX) injection 40 mg Start: 10/29/19 1000   Code Status: Full code Family Communication: None today. Disposition Plan: Status is: Inpatient  Remains inpatient appropriate  because:Inpatient level of care appropriate due to severity of illness   Dispo: The patient is from: Home              Anticipated d/c is to: To be decided.  A skilled nursing facility when bed available.              Anticipated d/c date is: When bed available.              Patient currently is medically stable to transfer to rehab/skilled level of care.   Consultants:   Orthopedics  Procedures:   ORIF right knee, 7/8  Antimicrobials:   None   Subjective: Patient seen and examined.  No overnight events.  Minimal pain.  Very motivated and working with therapies.  Objective: Vitals:   11/04/19 0837 11/04/19 1407 11/04/19 2100 11/05/19 0732  BP: (!) 112/58 (!) 110/46 (!) 132/57 (!) 159/69  Pulse: 70 87 86 74  Resp: 18 20 14 17   Temp: 98.5 F (36.9 C) 98.4 F (36.9 C) 98.4 F (36.9 C) 98 F (36.7 C)  TempSrc: Oral Oral Oral Oral  SpO2: 97% 95% 99% 96%  Weight:      Height:        Intake/Output Summary (Last 24 hours) at 11/05/2019 1430 Last data filed at 11/05/2019 0900 Gross per 24 hour  Intake 240 ml  Output --  Net 240 ml   Filed Weights   10/29/19 1244 10/31/19 0500 11/02/19 0500  Weight: 76.5 kg 75.4 kg 75.1 kg    Examination:  Physical Exam Constitutional:      Appearance: Normal appearance.  Cardiovascular:     Rate and Rhythm: Normal rate.  Abdominal:     General: Bowel sounds are normal.  Palpations: Abdomen is soft.  Musculoskeletal:     Comments: Right knee on hinged brace, postop incisions clean and dry with minimal swelling. Left knee with minimal swelling. Right index finger on splint.  Neurological:     Mental Status: She is alert.        Data Reviewed: I have personally reviewed following labs and imaging studies  CBC: Recent Labs  Lab 10/30/19 0706 10/31/19 0458 11/01/19 0301 11/02/19 0301  WBC 6.9 10.6* 7.2 6.5  HGB 9.2* 10.2* 10.4* 10.1*  HCT 28.3* 31.7* 31.6* 31.7*  MCV 98.3 98.4 97.8 97.5  PLT 174 212 223 063    Basic Metabolic Panel: Recent Labs  Lab 10/30/19 0706 10/31/19 0458 11/01/19 0301 11/02/19 0301  NA 137 141 142 140  K 4.0 3.6 3.6 3.8  CL 104 106 106 106  CO2 25 25 26 25   GLUCOSE 128* 121* 119* 110*  BUN 12 11 9 22   CREATININE 0.67 0.80 0.67 0.71  CALCIUM 8.4* 8.5* 8.8* 8.8*   GFR: Estimated Creatinine Clearance: 72.6 mL/min (by C-G formula based on SCr of 0.71 mg/dL). Liver Function Tests: No results for input(s): AST, ALT, ALKPHOS, BILITOT, PROT, ALBUMIN in the last 168 hours. No results for input(s): LIPASE, AMYLASE in the last 168 hours. No results for input(s): AMMONIA in the last 168 hours. Coagulation Profile: No results for input(s): INR, PROTIME in the last 168 hours. Cardiac Enzymes: No results for input(s): CKTOTAL, CKMB, CKMBINDEX, TROPONINI in the last 168 hours. BNP (last 3 results) No results for input(s): PROBNP in the last 8760 hours. HbA1C: No results for input(s): HGBA1C in the last 72 hours. CBG: No results for input(s): GLUCAP in the last 168 hours. Lipid Profile: No results for input(s): CHOL, HDL, LDLCALC, TRIG, CHOLHDL, LDLDIRECT in the last 72 hours. Thyroid Function Tests: No results for input(s): TSH, T4TOTAL, FREET4, T3FREE, THYROIDAB in the last 72 hours. Anemia Panel: No results for input(s): VITAMINB12, FOLATE, FERRITIN, TIBC, IRON, RETICCTPCT in the last 72 hours. Sepsis Labs: No results for input(s): PROCALCITON, LATICACIDVEN in the last 168 hours.  Recent Results (from the past 240 hour(s))  SARS Coronavirus 2 by RT PCR (hospital order, performed in [facility] hospital lab) Nasopharyngeal Nasopharyngeal Swab     Status: None   Collection Time: 10/27/19 11:07 PM   Specimen: Nasopharyngeal Swab  Result Value Ref Range Status   SARS Coronavirus 2 NEGATIVE NEGATIVE Final    Comment: (NOTE) SARS-CoV-2 target nucleic acids are NOT DETECTED.  The SARS-CoV-2 RNA is generally detectable in upper and lower respiratory specimens during  the acute phase of infection. The lowest concentration of SARS-CoV-2 viral copies this assay can detect is 250 copies / mL. A negative result does not preclude SARS-CoV-2 infection and should not be used as the sole basis for treatment or other patient management decisions.  A negative result may occur with improper specimen collection / handling, submission of specimen other than nasopharyngeal swab, presence of viral mutation(s) within the areas targeted by this assay, and inadequate number of viral copies (<250 copies / mL). A negative result must be combined with clinical observations, patient history, and epidemiological information.  Fact Sheet for Patients:   StrictlyIdeas.no  Fact Sheet for Healthcare Providers: BankingDealers.co.za  This test is not yet approved or  cleared by the [place] FDA and has been authorized for detection and/or diagnosis of SARS-CoV-2 by FDA under an Emergency Use Authorization (EUA).  This EUA will remain in effect (meaning this test can  be used) for the duration of the COVID-19 declaration under Section 564(b)(1) of the Act, 21 U.S.C. section 360bbb-3(b)(1), unless the authorization is terminated or revoked sooner.  Performed at [facility], [place] [address] [place], [place] [address]   Surgical pcr screen     Status: Abnormal   Collection Time: 10/28/19  3:15 AM   Specimen: Nasal Mucosa; Nasal Swab  Result Value Ref Range Status   MRSA, PCR NEGATIVE NEGATIVE Final   Staphylococcus aureus POSITIVE (A) NEGATIVE Final    Comment: (NOTE) The Xpert SA Assay (FDA approved for NASAL specimens in patients 74 years of age and older), is one component of a comprehensive surveillance program. It is not intended to diagnose infection nor to guide or monitor treatment. Performed at [facility], [place] [address] [place], [place] [address]          Radiology Studies: No results  found.      Scheduled Meds: . aspirin EC  81 mg Oral Daily  . enoxaparin (LOVENOX) injection  40 mg Subcutaneous Daily  . FLUoxetine  20 mg Oral Daily  . lisinopril  10 mg Oral Daily  . magnesium citrate  0.5 Bottle Oral Once  . rosuvastatin  20 mg Oral Daily  . senna-docusate  1 tablet Oral QHS  . sodium chloride flush  3 mL Intravenous Q12H   Continuous Infusions:    LOS: 8 days    Time spent: 20 minutes    Barb Merino, MD [facility] Pager [phone]

## 2019-11-05 NOTE — Progress Notes (Signed)
Inpatient Rehabilitation-Admissions Coordinator   Received notification from pt's insurance company that despite peer to peer conference today with acute MD, the patient's request for CIR has still been denied. I notified the patient of the denial. She would like to know more about her options for SNF vs HH. I have contacted the [facility] team to notify them of the denial and need for an alterative dc plan.   AC will sign off.   Please call if questions.   Raechel Ache, OTR/L  Rehab Admissions Coordinator  [phone] 11/05/2019 12:26 PM

## 2019-11-05 NOTE — [place] FL2 (Signed)
[place] MEDICAID FL2 LEVEL OF CARE SCREENING TOOL     IDENTIFICATION  Patient Name: April Castillo Birthdate: 1956/03/14 Sex: female Admission Date (Current Location): 10/27/2019  [facility] and [place] Number:  Herbalist and Address:  [facility] [place], [place] [address], [place], [place] [address]      Provider Number: [id number]  Attending Physician Name and Address:  Barb Merino, MD  Relative Name and Phone Number:  [name]    Current Level of Care: Hospital Recommended Level of Care: [place] Prior Approval Number:    Date Approved/Denied:   PASRR Number: [id number]  Discharge Plan: SNF    Current Diagnoses: Patient Active Problem List   Diagnosis Date Noted   Closed patellar sleeve fracture of right knee 10/28/2019   Finger fracture, right 10/28/2019   Intractable pain 10/28/2019   Conjunctivitis 11/07/2017   Hyperlipidemia 01/01/2013   Allergic rhinitis 12/12/2011   Breast mass, right 11/23/2010   WEIGHT GAIN 11/07/2009   Depression 09/26/2006   Essential hypertension 09/26/2006    Orientation RESPIRATION BLADDER Height & Weight     Self, Time, Situation, Place  Normal Continent Weight: 75.1 kg Height:  5' 5.98" (167.6 cm)  BEHAVIORAL SYMPTOMS/MOOD NEUROLOGICAL BOWEL NUTRITION STATUS      Continent Diet (See discharge summary)  AMBULATORY STATUS COMMUNICATION OF NEEDS Skin   Limited Assist Verbally Surgical wounds                       Personal Care Assistance Level of Assistance  Bathing, Dressing Bathing Assistance: Limited assistance   Dressing Assistance: Limited assistance     Functional Limitations Info  Sight, Hearing, Speech Sight Info: Adequate Hearing Info: Adequate Speech Info: Adequate    SPECIAL CARE FACTORS FREQUENCY  PT (By licensed PT), OT (By licensed OT)     PT Frequency: 5 x per week OT Frequency: 5 x per week            Contractures Contractures  Info: Not present    Additional Factors Info  Code Status, Allergies Code Status Info: Full Allergies Info: NKDA           Current Medications (11/05/2019):  This is the current hospital active medication list Current Facility-Administered Medications  Medication Dose Route Frequency Provider Last Rate Last Admin   aspirin EC tablet 81 mg  81 mg Oral Daily Howerter, Justin B, DO   81 mg at 11/05/19 0948   enoxaparin (LOVENOX) injection 40 mg  40 mg Subcutaneous Daily Howerter, Justin B, DO   40 mg at 11/05/19 0948   FLUoxetine (PROZAC) capsule 20 mg  20 mg Oral Daily Howerter, Justin B, DO   20 mg at 11/05/19 0948   lisinopril (ZESTRIL) tablet 10 mg  10 mg Oral Daily Howerter, Justin B, DO   10 mg at 11/05/19 0948   magnesium citrate solution 0.5 Bottle  0.5 Bottle Oral Once Barb Merino, MD       methocarbamol (ROBAXIN) tablet 500 mg  500 mg Oral Q6H PRN Magnant, Charles L, PA-C   500 mg at 11/05/19 0948   morphine 4 MG/ML injection 4 mg  4 mg Intravenous Q3H PRN Howerter, Justin B, DO       ondansetron (ZOFRAN) tablet 4 mg  4 mg Oral Q6H PRN Howerter, Justin B, DO       oxyCODONE-acetaminophen (PERCOCET/ROXICET) 5-325 MG per tablet 1 tablet  1 tablet Oral Q4H PRN Howerter, Larkin Ina  B, DO   1 tablet at 11/04/19 2116   rosuvastatin (CRESTOR) tablet 20 mg  20 mg Oral Daily Howerter, Justin B, DO   20 mg at 11/05/19 1443   senna-docusate (Senokot-S) tablet 1 tablet  1 tablet Oral QHS Barb Merino, MD   1 tablet at 11/04/19 2115   sodium chloride flush (NS) 0.9 % injection 3 mL  3 mL Intravenous Q12H Howerter, Justin B, DO   3 mL at 11/05/19 1540     Discharge Medications: Please see discharge summary for a list of discharge medications.  Relevant Imaging Results:  Relevant Lab Results:   Additional Information GQ#[id number]  [name], RN

## 2019-11-05 NOTE — Progress Notes (Signed)
Physical Therapy Treatment Patient Details Name: April Castillo MRN: [id number] DOB: 1955/07/26 Today's Date: 11/05/2019    History of Present Illness Pt is a 64 y.o. woman admitted 10/27/19 after fall while washing horse that became spooked, pt hit head on fence and fell onto bilateral knees. Sustained bilateral patella fxs, R first finger fx. S/p R patellar ORIF 7/8; non-operative management L knee. PMH includes R hip fx, HTN, HLD, depression.    PT Comments    Pt OOB in recliner upon arrival of PT, agreeable to session with focus on determining new d/c plan and progression of mobility. The pt was able to demo significant improvements in ambulation distance, but continues to ambulate with slow, antalgic pattern. The pt was also educated in a few LE strengthening exercises to complete 3x daily, and was able to demo and verbalize understanding. The pt will continue to benefit from skilled rehab to regain prior level of independence and activity prior to returning home, especially as she has 9-10 steps to enter.    Follow Up Recommendations  SNF;Supervision/Assistance - 24 hour     Equipment Recommendations  Rolling walker with 5" wheels;3in1 (PT)    Recommendations for Other Services       Precautions / Restrictions Precautions Precautions: Fall Required Braces or Orthoses: Other Brace;Knee Immobilizer - Right Knee Immobilizer - Right: On at all times;Other (comment) (maintain in extension) Knee Immobilizer - Left: Other (comment) (L hinged knee brace can flex knees for transfers) Other Brace: RLE bledsoe brace locked in extension; strict orders to keep R knee in extension.  Pt can flex L knee for transfers Restrictions Weight Bearing Restrictions: Yes RLE Weight Bearing: Weight bearing as tolerated LLE Weight Bearing: Weight bearing as tolerated    Mobility  Bed Mobility Overal bed mobility: Needs Assistance             General bed mobility comments: pt OOB in recliner upon  arrival and at end of session  Transfers Overall transfer level: Needs assistance Equipment used: Rolling walker (2 wheeled) Transfers: Sit to/from Stand Sit to Stand: Min guard         General transfer comment: pt able to manage hand placement without cues, rise from low chair with minG and extra time  Ambulation/Gait Ambulation/Gait assistance: Supervision Gait Distance (Feet): 200 Feet Assistive device: Rolling walker (2 wheeled) Gait Pattern/deviations: Step-to pattern;Decreased weight shift to right;Antalgic;Decreased stance time - right Gait velocity: 0.2 m/s Gait velocity interpretation: <1.31 ft/sec, indicative of household ambulator General Gait Details: Pt with slow guarded gt due to pain.  She required decreased assistance once standing upright.       Balance Overall balance assessment: Needs assistance Sitting-balance support: No upper extremity supported Sitting balance-Leahy Scale: Good     Standing balance support: During functional activity;Bilateral upper extremity supported Standing balance-Leahy Scale: Fair Standing balance comment: able to complete dynamis tasks at sink with no UE support and no LOB                            Cognition Arousal/Alertness: Awake/alert Behavior During Therapy: WFL for tasks assessed/performed Overall Cognitive Status: Within Functional Limits for tasks assessed                                        Exercises General Exercises - Lower Extremity Ankle Circles/Pumps: AROM;10 reps;Supine;Both Heel Slides:  AROM;Left;10 reps;Supine Hip ABduction/ADduction: 10 reps;Supine;Both;AAROM    General Comments General comments (skin integrity, edema, etc.): pt with supportive friend present, interested in SNF after CIR declined by insurance. Talked at length about difference between SNF and HHPT, and what she would need to be able to do to go home      Pertinent Vitals/Pain Pain Assessment:  Faces Faces Pain Scale: Hurts little more Pain Location: RLE>LLE, especially with transitions to edge of chair to stand or lowering to chair from standing Pain Descriptors / Indicators: Guarding;Grimacing;Sore Pain Intervention(s): Limited activity within patient's tolerance;Monitored during session;Repositioned           PT Goals (current goals can now be found in the care plan section) Acute Rehab PT Goals Patient Stated Goal: Regain independence PT Goal Formulation: With patient Time For Goal Achievement: 11/13/19 Potential to Achieve Goals: Good Progress towards PT goals: Progressing toward goals    Frequency    Min 5X/week      PT Plan Discharge plan needs to be updated       AM-PAC PT "6 Clicks" Mobility   Outcome Measure  Help needed turning from your back to your side while in a flat bed without using bedrails?: A Little Help needed moving from lying on your back to sitting on the side of a flat bed without using bedrails?: A Little Help needed moving to and from a bed to a chair (including a wheelchair)?: A Little Help needed standing up from a chair using your arms (e.g., wheelchair or bedside chair)?: A Little Help needed to walk in hospital room?: A Little Help needed climbing 3-5 steps with a railing? : A Little 6 Click Score: 18    End of Session Equipment Utilized During Treatment: Gait belt Activity Tolerance: Patient tolerated treatment well;Patient limited by pain Patient left: in chair;with family/visitor present;with call bell/phone within reach Nurse Communication: Mobility status PT Visit Diagnosis: Other abnormalities of gait and mobility (R26.89);Pain Pain - Right/Left: Right Pain - part of body: Knee;Leg (bilateral)     Time: 0258-5277 PT Time Calculation (min) (ACUTE ONLY): 38 min  Charges:  $Gait Training: 23-37 mins $Therapeutic Exercise: 8-22 mins                     Karma Ganja, PT, DPT   Acute Rehabilitation Department Pager #:  [phone]   Otho Bellows 11/05/2019, 5:49 PM

## 2019-11-06 LAB — SARS CORONAVIRUS 2 BY RT PCR (HOSPITAL ORDER, PERFORMED IN ~~LOC~~ HOSPITAL LAB): SARS Coronavirus 2: NEGATIVE

## 2019-11-06 MED ORDER — ENOXAPARIN SODIUM 40 MG/0.4ML ~~LOC~~ SOLN: 40.0000 mg | Freq: Every day | SUBCUTANEOUS | 0 refills | Status: DC

## 2019-11-06 MED ORDER — METHOCARBAMOL 500 MG PO TABS: 500.0000 mg | ORAL_TABLET | Freq: Four times a day (QID) | ORAL | Status: DC | PRN

## 2019-11-06 NOTE — Progress Notes (Signed)
Physical Therapy Treatment Patient Details Name: April Castillo MRN: [id number] DOB: Apr 09, 1956 Today's Date: 11/06/2019    History of Present Illness Pt is a 64 y.o. woman admitted 10/27/19 after fall while washing horse that became spooked, pt hit head on fence and fell onto bilateral knees. Sustained bilateral patella fxs, R first finger fx. S/p R patellar ORIF 7/8; non-operative management L knee. PMH includes R hip fx, HTN, HLD, depression.    PT Comments    Pt supine in bed on arrival playing scrabble with her visitor.  Pt required assistance to move to edge of bed and back to bed but is progressing well with transfers and gt training. Attempted to trial stairs but too much pain noted in LLE with climbing and loading weight.  Continue to recommend snf placement before returning home.     Follow Up Recommendations  SNF;Supervision/Assistance - 24 hour     Equipment Recommendations  Rolling walker with 5" wheels;3in1 (PT)    Recommendations for Other Services       Precautions / Restrictions Precautions Precautions: Fall Required Braces or Orthoses: Other Brace;Knee Immobilizer - Right Knee Immobilizer - Right: On at all times;Other (comment) Knee Immobilizer - Left: Other (comment) (L hinged knee brace, can flex knees for transfer) Other Brace: RLE bledsoe brace locked in extension; strict orders to keep R knee in extension.  Pt can flex L knee for transfers Restrictions Weight Bearing Restrictions: Yes RLE Weight Bearing: Weight bearing as tolerated LLE Weight Bearing: Weight bearing as tolerated Other Position/Activity Restrictions: Strap just below R knee is uncomfortable called [employer] rep for [employer] to come assess brace.    Mobility  Bed Mobility Overal bed mobility: Needs Assistance Bed Mobility: Supine to Sit;Sit to Supine     Supine to sit: Mod assist Sit to supine: Mod assist   General bed mobility comments: Pt required assistance to support RLE to move to edge  o fbed with heavy boosting with bed pad.  Attempted to move to the R side of her bed to simulate home environment.  To return back to bed she required moderate assistance to lift RLE against gravity.  Transfers Overall transfer level: Needs assistance Equipment used: Rolling walker (2 wheeled) Transfers: Sit to/from [facility] Sit to Stand: Min guard;From elevated surface Stand pivot transfers: Min guard       General transfer comment: pt able to manage hand placement without cues, rise from elevated bed this session with min G.  Ambulation/Gait Ambulation/Gait assistance: Supervision Gait Distance (Feet): 200 Feet Assistive device: Rolling walker (2 wheeled) Gait Pattern/deviations: Step-to pattern;Decreased weight shift to right;Antalgic;Decreased stance time - right     General Gait Details: Pt with slow guarded gt due to pain.  She required decreased assistance once standing upright.   Stairs Stairs: Yes Stairs assistance: Mod assist Stair Management: Sideways;One rail Right Number of Stairs: 1 General stair comments: Pt only negotiated one stair sideways and reports "too much pain."  Required RW to descend x 1 stair with poor flexion in L knee to lower weight.   Wheelchair Mobility    Modified Rankin (Stroke Patients Only)       Balance Overall balance assessment: Needs assistance Sitting-balance support: No upper extremity supported Sitting balance-Leahy Scale: Good       Standing balance-Leahy Scale: Fair                              Cognition Arousal/Alertness:  Awake/alert Behavior During Therapy: WFL for tasks assessed/performed Overall Cognitive Status: Within Functional Limits for tasks assessed                                        Exercises General Exercises - Lower Extremity Ankle Circles/Pumps: AROM;10 reps;Supine;Both Quad Sets: AROM;Left;10 reps;Supine Heel Slides: AROM;Left;10 reps;Supine Hip  ABduction/ADduction: 10 reps;Supine;Both;AAROM;5 reps (x5 of R with gt belt and x10 on L.) Straight Leg Raises: AAROM;Left;10 reps;Supine    General Comments        Pertinent Vitals/Pain Pain Assessment: No/denies pain Faces Pain Scale: Hurts little more Pain Location: RLE>LLE, especially with transitions to edge of chair to stand or lowering to chair from standing Pain Descriptors / Indicators: Guarding;Grimacing;Sore Pain Intervention(s): Monitored during session;Repositioned    Home Living                      Prior Function            PT Goals (current goals can now be found in the care plan section) Acute Rehab PT Goals Patient Stated Goal: Regain independence Potential to Achieve Goals: Good Progress towards PT goals: Progressing toward goals    Frequency    Min 5X/week      PT Plan Current plan remains appropriate    Co-evaluation              AM-PAC PT "6 Clicks" Mobility   Outcome Measure  Help needed turning from your back to your side while in a flat bed without using bedrails?: A Little Help needed moving from lying on your back to sitting on the side of a flat bed without using bedrails?: A Little Help needed moving to and from a bed to a chair (including a wheelchair)?: A Little Help needed standing up from a chair using your arms (e.g., wheelchair or bedside chair)?: A Little Help needed to walk in hospital room?: A Little Help needed climbing 3-5 steps with a railing? : A Little 6 Click Score: 18    End of Session Equipment Utilized During Treatment: Gait belt Activity Tolerance: Patient tolerated treatment well;Patient limited by pain Patient left: with family/visitor present;with call bell/phone within reach;in bed Nurse Communication: Mobility status PT Visit Diagnosis: Other abnormalities of gait and mobility (R26.89);Pain Pain - Right/Left: Right Pain - part of body: Knee;Leg (bilateral)     Time: 7564-3329 PT Time  Calculation (min) (ACUTE ONLY): 30 min  Charges:  $Gait Training: 8-22 mins $Therapeutic Exercise: 8-22 mins                     Erasmo Leventhal , PTA Acute Rehabilitation Services Pager [phone] Office [phone]     Anila Bojarski Eli Hose 11/06/2019, 2:48 PM

## 2019-11-06 NOTE — Progress Notes (Signed)
Occupational Therapy Treatment Patient Details Name: April Castillo MRN: [id number] DOB: 15-Jul-1955 Today's Date: 11/06/2019    History of present illness Pt is a 64 y.o. woman admitted 10/27/19 after fall while washing horse that became spooked, pt hit head on fence and fell onto bilateral knees. Sustained bilateral patella fxs, R first finger fx. S/p R patellar ORIF 7/8; non-operative management L knee. PMH includes R hip fx, HTN, HLD, depression.   OT comments  Pt. Seen for skilled OT. Able to complete bed mobility with S with utilization of leg lifter.  Ambulate to/from b.room for toileting and grooming tasks min guard a.  Remains motivated and in good spirits with her progress.   Follow Up Recommendations       Equipment Recommendations  None recommended by OT    Recommendations for Other Services      Precautions / Restrictions Precautions Precautions: Fall Required Braces or Orthoses: Other Brace;Knee Immobilizer - Right Knee Immobilizer - Right: On at all times;Other (comment) Other Brace: RLE bledsoe brace locked in extension; strict orders to keep R knee in extension.  Pt can flex L knee for transfers Restrictions Weight Bearing Restrictions: Yes RLE Weight Bearing: Weight bearing as tolerated LLE Weight Bearing: Weight bearing as tolerated       Mobility Bed Mobility Overal bed mobility: Needs Assistance Bed Mobility: Supine to Sit     Supine to sit: Supervision     General bed mobility comments: pt. able to manage b les out of bed with use of her "lasso" (leg lifter) to guide to eob without physical assistance  Transfers Overall transfer level: Needs assistance Equipment used: Rolling walker (2 wheeled) Transfers: Sit to/from [facility] Sit to Stand: Min guard Stand pivot transfers: Min guard            Balance                                           ADL either performed or assessed with clinical judgement   ADL  Overall ADL's : Needs assistance/impaired     Grooming: Wash/dry hands;Wash/dry face;Standing;Supervision/safety                   Toilet Transfer: Min guard;Ambulation;Comfort height toilet;BSC;Grab bars;RW [employer] forces technical [employer] Details (indicate cue type and reason): BSC over regular toilet ( pt benefits from higher sitting surface d/t needing to keep RLE in extension), minguard to ambulate to toilet Toileting- Clothing Manipulation and Hygiene: Sit to/from stand;Supervision/safety       Functional mobility during ADLs: Min guard;Rolling walker General ADL Comments: pt able to complete full standing ADL at sink     Vision       Perception     Praxis      Cognition Arousal/Alertness: Awake/alert Behavior During Therapy: WFL for tasks assessed/performed Overall Cognitive Status: Within Functional Limits for tasks assessed                                          Exercises     Shoulder Instructions       General Comments  retired travel agent, loves horses     Pertinent Vitals/ Pain       Pain Assessment: No/denies pain  Home Living  Prior Functioning/Environment              Frequency  Min 2X/week        Progress Toward Goals  OT Goals(current goals can now be found in the care plan section)  Progress towards OT goals: Progressing toward goals     Plan Discharge plan remains appropriate;Frequency remains appropriate    Co-evaluation                 AM-PAC OT "6 Clicks" Daily Activity     Outcome Measure   Help from another person eating meals?: None Help from another person taking care of personal grooming?: None Help from another person toileting, which includes using toliet, bedpan, or urinal?: A Little Help from another person bathing (including washing, rinsing, drying)?: A Little Help from another person to put on and taking off regular upper body  clothing?: None Help from another person to put on and taking off regular lower body clothing?: A Lot 6 Click Score: 20    End of Session Equipment Utilized During Treatment: Gait belt;Rolling walker;Right knee immobilizer;Other (comment) (knee brace)  OT Visit Diagnosis: Unsteadiness on feet (R26.81);Other abnormalities of gait and mobility (R26.89);Pain;Muscle weakness (generalized) (M62.81)   Activity Tolerance Patient tolerated treatment well   Patient Left in chair;with call bell/phone within reach   Nurse Communication          Time: 5790-3833 OT Time Calculation (min): 19 min  Charges: OT General Charges $OT Visit: 1 Visit OT Treatments $Self Care/Home Management : 8-22 mins  Sonia Baller, COTA/L Acute Rehabilitation [phone]   Janice Coffin 11/06/2019, 12:11 PM

## 2019-11-06 NOTE — Plan of Care (Signed)
  Problem: Safety: Goal: Ability to remain free from injury will improve Outcome: Progressing   

## 2019-11-06 NOTE — Discharge Summary (Signed)
Physician Discharge Summary  April Castillo YIF:[id number] DOB: 09/13/1955 DOA: 10/27/2019  PCP: Gifford Shave, MD  Admit date: 10/27/2019 Discharge date: 11/06/2019  Admitted From: home  Disposition:  SNF  Recommendations for Outpatient Follow-up:  1. Follow up with PCP in 1-2 weeks 2. Follow-up with orthopedics as scheduled. Surgeon will schedule follow-up.   Discharge Condition: Stable CODE STATUS: Full code Diet recommendation: Low-salt diet  Discharge summary:  64 year old female with history of hypertension and hyperlipidemia admitted to the hospital with mechanical fall and bilateral knee pain.  Skeletal survey consistent with right patella fracture and admitted for surgery. She was handling horse, fell on the front concrete with both knees and right hand. This is her third injury in her life related to horse riding. 7/12, waiting to go to acute inpatient rehab. 7/15, insurance declined for rehab.  Exploring options. 7/16, waiting to go to skilled nursing facility.  Assessment & Plan and further care:   Closed traumatic patella fracture of the right knee: Status post ORIF of the right patella.  Surgically stable as per surgery.  Hinged knee brace and mobility. Adequate pain medications with bowel regimen. Using Norco and Robaxin. Weightbearing as tolerated. DVT prophylaxis, Lovenox day 9/14. Total 2 weeks of Lovenox.  Closed traumatic patellar fracture of the left knee with no displacement: Suggested weightbearing as tolerated and pain medications.  Right index finger fracture: Immobilized with splint. Stable.  Hypertension: Well-controlled on lisinopril continue.  Hyperlipidemia: On rosuvastatin.  Depression: On fluoxetine that is continued.  Denied acute inpatient rehab by insurance provider. To continue inpatient therapies at the skilled nursing facility. Stable for transfer.    Discharge Diagnoses:  Principal Problem:   Closed patellar sleeve fracture  of right knee Active Problems:   Depression   Essential hypertension   Hyperlipidemia   Finger fracture, right   Intractable pain    Discharge Instructions  Discharge Instructions    Ambulatory referral to Physical Medicine Rehab   Complete by: As directed    Management of home therapies and transition to outpatient rehabilitation   Call MD for:  redness, tenderness, or signs of infection (pain, swelling, redness, odor or green/yellow discharge around incision site)   Complete by: As directed    Call MD for:  severe uncontrolled pain   Complete by: As directed    Diet - low sodium heart healthy   Complete by: As directed    Increase activity slowly   Complete by: As directed    No wound care   Complete by: As directed      Allergies as of 11/06/2019   No Known Allergies     Medication List    STOP taking these medications   fluticasone 50 MCG/ACT nasal spray Commonly known as: FLONASE   olopatadine 0.1 % ophthalmic solution Commonly known as: Patanol     TAKE these medications   alendronate 70 MG tablet Commonly known as: FOSAMAX Take 70 mg by mouth once a week. Take with a full glass of water on an empty stomach.   aspirin EC 81 MG tablet Take 81 mg by mouth daily. Swallow whole.   enoxaparin 40 MG/0.4ML injection Commonly known as: LOVENOX Inject 0.4 mLs (40 mg total) into the skin daily for 5 days. Start taking on: November 07, 2019   FLUoxetine 20 MG capsule Commonly known as: PROZAC Take 1 capsule (20 mg total) by mouth daily.   ibuprofen 800 MG tablet Commonly known as: ADVIL Take 800 mg by mouth every  8 (eight) hours as needed. for low back pain   lisinopril 10 MG tablet Commonly known as: ZESTRIL TAKE 1 TABLET DAILY   methocarbamol 500 MG tablet Commonly known as: ROBAXIN Take 1 tablet (500 mg total) by mouth every 6 (six) hours as needed for muscle spasms.   oxyCODONE-acetaminophen 5-325 MG tablet Commonly known as: PERCOCET/ROXICET Take 1  tablet by mouth every 6 (six) hours as needed for up to 5 days for moderate pain.   rosuvastatin 20 MG tablet Commonly known as: CRESTOR Take 20 mg by mouth daily.       No Known Allergies  Consultations:  Orthopedics   Procedures/Studies: DG Knee 1-2 Views Right  Result Date: 10/29/2019 CLINICAL DATA:  ORIF right patella fracture. EXAM: RIGHT KNEE - 1-2 VIEW; DG C-ARM 1-60 MIN COMPARISON:  Radiograph 10/27/2019 FINDINGS: Two fluoroscopic spot views of the right knee obtained in the operating room. Patellar fracture. No hardware is visualized on the provided views. Total fluoroscopy time 8 seconds. Total dose 0.34 mGy. IMPRESSION: Intraoperative fluoroscopy during right knee patellar surgery. Electronically Signed   By: Keith Rake M.D.   On: 10/29/2019 18:46   CT KNEE LEFT WO CONTRAST  Result Date: 10/30/2019 CLINICAL DATA:  Known right knee fracture, query initially radiographically occult injury of the left knee. EXAM: CT OF THE left KNEE WITHOUT CONTRAST TECHNIQUE: Multidetector CT imaging of the left knee was performed according to the standard protocol. Multiplanar CT image reconstructions were also generated. COMPARISON:  Radiograph 10/27/2019 FINDINGS: Bones/Joint/Cartilage Subtle linear nondisplaced fractures of the left knee. One of these extends obliquely along the inferomedial pole of the left patella as shown on image 67/6. There also appears to be a subtle linear vertically oriented fracture plane at the junction of the middle and lateral patellar thirds as shown on image 72/6. There is considerable degenerative articular cartilage thinning in the medial compartment and moderate degenerative chondral thinning in the lateral compartment causing joint space narrowing. No other fractures around the knee are identified. Small to moderate-sized hemarthrosis. Ligaments Suboptimally assessed by CT. Structures with expected contour for the ACL and PCL are observed. Muscles and Tendons  Unremarkable Soft tissues Subcutaneous edema anterior to the patellar tendon and along the patellar retinacula, lateral greater than medial. IMPRESSION: 1. Subtle linear nondisplaced fractures of the left patella. One of these extends obliquely along the inferomedial pole of the left patella. There also appears to be a subtle linear vertically oriented fracture plane at the junction of the middle and lateral patellar thirds. 2. Small to moderate-sized hemarthrosis. 3. Subcutaneous edema anterior to the patellar tendon and along the patellar retinacula, lateral greater than medial. 4. Considerable degenerative articular cartilage thinning in the medial compartment and moderate degenerative chondral thinning in the lateral compartment. Electronically Signed   By: Van Clines M.D.   On: 10/30/2019 06:11   DG Knee Complete 4 Views Left  Result Date: 10/27/2019 CLINICAL DATA:  Status post trauma. EXAM: LEFT KNEE - COMPLETE 4+ VIEW COMPARISON:  None. FINDINGS: No evidence of acute fracture or dislocation. No evidence of arthropathy or other focal bone abnormality. There is a small joint effusion. IMPRESSION: No acute fracture or dislocation. Small joint effusion. Electronically Signed   By: Virgina Norfolk M.D.   On: 10/27/2019 19:31   DG Knee Complete 4 Views Right  Result Date: 10/27/2019 CLINICAL DATA:  64 year old female with fall bilateral knee pain. EXAM: RIGHT KNEE - COMPLETE 4+ VIEW COMPARISON:  Left knee radiograph dated 10/27/2019. FINDINGS: There  is a comminuted fracture of the patella with 3 main displaced fracture fragments. There is no dislocation. The bones are osteopenic. There is a moderate joint effusion. The soft tissue swelling of the anterior knee. No radiopaque foreign object or soft tissue gas. IMPRESSION: 1. Displaced patellar fractures. No dislocation. 2. Moderate joint effusion. Electronically Signed   By: Anner Crete M.D.   On: 10/27/2019 19:30   DG Knee Right  Port  Result Date: 10/29/2019 CLINICAL DATA:  Postop right patellar fracture. EXAM: PORTABLE RIGHT KNEE - 1-2 VIEW COMPARISON:  Preoperative radiograph 09/28/2019 FINDINGS: Debridement of the lower patellar pole. Few comminuted fragments inferiorly. Recent postsurgical change includes air and edema in the joint space and soft tissues. Tibiofemoral alignment is maintained. IMPRESSION: Postsurgical fixation of patellar fracture. Electronically Signed   By: Keith Rake M.D.   On: 10/29/2019 18:48   DG Hand Complete Right  Result Date: 10/27/2019 CLINICAL DATA:  Status post trauma. EXAM: RIGHT HAND - COMPLETE 3+ VIEW COMPARISON:  None. FINDINGS: Acute fracture is seen involving the middle phalanx of the second right finger. There is no evidence of dislocation. A radiopaque ring (i.e. Jewelry) is seen overlying the proximal phalanx of the fourth right finger. A radiopaque pulse on motor is seen overlying the distal phalanx of the fourth right finger. There is no evidence of arthropathy or other focal bone abnormality. Soft tissues are unremarkable. IMPRESSION: Acute fracture of the middle phalanx of the second right finger. Electronically Signed   By: Virgina Norfolk M.D.   On: 10/27/2019 19:29   DG C-Arm 1-60 Min  Result Date: 10/29/2019 CLINICAL DATA:  ORIF right patella fracture. EXAM: RIGHT KNEE - 1-2 VIEW; DG C-ARM 1-60 MIN COMPARISON:  Radiograph 10/27/2019 FINDINGS: Two fluoroscopic spot views of the right knee obtained in the operating room. Patellar fracture. No hardware is visualized on the provided views. Total fluoroscopy time 8 seconds. Total dose 0.34 mGy. IMPRESSION: Intraoperative fluoroscopy during right knee patellar surgery. Electronically Signed   By: Keith Rake M.D.   On: 10/29/2019 18:46    (Echo, Carotid, EGD, Colonoscopy, ERCP)    Subjective: Patient seen and examined. No overnight events. Mild pain. Having regular bowel movements now.   Discharge Exam: Vitals:    11/06/19 0408 11/06/19 0800  BP: (!) 110/56 108/73  Pulse: 70 63  Resp: 14 17  Temp: 98.4 F (36.9 C) 98.3 F (36.8 C)  SpO2: 96% 97%   Vitals:   11/05/19 1514 11/05/19 1930 11/06/19 0408 11/06/19 0800  BP: 109/60 (!) 121/57 (!) 110/56 108/73  Pulse: 95 91 70 63  Resp: 17 18 14 17   Temp: 98.4 F (36.9 C) 98.8 F (37.1 C) 98.4 F (36.9 C) 98.3 F (36.8 C)  TempSrc: Oral Oral Oral Oral  SpO2: 97% 97% 96% 97%  Weight:      Height:        General: Pt is alert, awake, not in acute distress Cardiovascular: RRR, S1/S2 +, no rubs, no gallops Respiratory: CTA bilaterally, no wheezing, no rhonchi Abdominal: Soft, NT, ND, bowel sounds + Extremities: no edema, no cyanosis Right knee on hinged knee brace, incisions clean and dry. Mild swelling. Left knee on knee brace, minimal patellar swelling. Distal neurovascular status intact. Right index finger on splint immobilizer.   The results of significant diagnostics from this hospitalization (including imaging, microbiology, ancillary and laboratory) are listed below for reference.     Microbiology: Recent Results (from the past 240 hour(s))  SARS Coronavirus 2 by RT  PCR (hospital order, performed in [facility] hospital lab) Nasopharyngeal Nasopharyngeal Swab     Status: None   Collection Time: 10/27/19 11:07 PM   Specimen: Nasopharyngeal Swab  Result Value Ref Range Status   SARS Coronavirus 2 NEGATIVE NEGATIVE Final    Comment: (NOTE) SARS-CoV-2 target nucleic acids are NOT DETECTED.  The SARS-CoV-2 RNA is generally detectable in upper and lower respiratory specimens during the acute phase of infection. The lowest concentration of SARS-CoV-2 viral copies this assay can detect is 250 copies / mL. A negative result does not preclude SARS-CoV-2 infection and should not be used as the sole basis for treatment or other patient management decisions.  A negative result may occur with improper specimen collection / handling, submission  of specimen other than nasopharyngeal swab, presence of viral mutation(s) within the areas targeted by this assay, and inadequate number of viral copies (<250 copies / mL). A negative result must be combined with clinical observations, patient history, and epidemiological information.  Fact Sheet for Patients:   StrictlyIdeas.no  Fact Sheet for Healthcare Providers: BankingDealers.co.za  This test is not yet approved or  cleared by the [place] FDA and has been authorized for detection and/or diagnosis of SARS-CoV-2 by FDA under an Emergency Use Authorization (EUA).  This EUA will remain in effect (meaning this test can be used) for the duration of the COVID-19 declaration under Section 564(b)(1) of the Act, 21 U.S.C. section 360bbb-3(b)(1), unless the authorization is terminated or revoked sooner.  Performed at [facility], [place] [address], [place], [place] [address]   Surgical pcr screen     Status: Abnormal   Collection Time: 10/28/19  3:15 AM   Specimen: Nasal Mucosa; Nasal Swab  Result Value Ref Range Status   MRSA, PCR NEGATIVE NEGATIVE Final   Staphylococcus aureus POSITIVE (A) NEGATIVE Final    Comment: (NOTE) The Xpert SA Assay (FDA approved for NASAL specimens in patients 42 years of age and older), is one component of a comprehensive surveillance program. It is not intended to diagnose infection nor to guide or monitor treatment. Performed at [facility], [place] [address] [place], [place] [address]      Labs: BNP (last 3 results) No results for input(s): BNP in the last 8760 hours. Basic Metabolic Panel: Recent Labs  Lab 10/31/19 0458 11/01/19 0301 11/02/19 0301  NA 141 142 140  K 3.6 3.6 3.8  CL 106 106 106  CO2 25 26 25   GLUCOSE 121* 119* 110*  BUN 11 9 22   CREATININE 0.80 0.67 0.71  CALCIUM 8.5* 8.8* 8.8*   Liver Function Tests: No results for input(s): AST, ALT, ALKPHOS, BILITOT,  PROT, ALBUMIN in the last 168 hours. No results for input(s): LIPASE, AMYLASE in the last 168 hours. No results for input(s): AMMONIA in the last 168 hours. CBC: Recent Labs  Lab 10/31/19 0458 11/01/19 0301 11/02/19 0301  WBC 10.6* 7.2 6.5  HGB 10.2* 10.4* 10.1*  HCT 31.7* 31.6* 31.7*  MCV 98.4 97.8 97.5  PLT 212 223 240   Cardiac Enzymes: No results for input(s): CKTOTAL, CKMB, CKMBINDEX, TROPONINI in the last 168 hours. BNP: Invalid input(s): POCBNP CBG: No results for input(s): GLUCAP in the last 168 hours. D-Dimer No results for input(s): DDIMER in the last 72 hours. Hgb A1c No results for input(s): HGBA1C in the last 72 hours. Lipid Profile No results for input(s): CHOL, HDL, LDLCALC, TRIG, CHOLHDL, LDLDIRECT in the last 72 hours. Thyroid function studies No  results for input(s): TSH, T4TOTAL, T3FREE, THYROIDAB in the last 72 hours.  Invalid input(s): FREET3 Anemia work up No results for input(s): VITAMINB12, FOLATE, FERRITIN, TIBC, IRON, RETICCTPCT in the last 72 hours. Urinalysis    Component Value Date/Time   COLORURINE YELLOW 03/03/2009 2310   APPEARANCEUR CLOUDY (A) 03/03/2009 2310   LABSPEC 1.017 03/03/2009 2310   PHURINE 7.5 03/03/2009 2310   GLUCOSEU NEGATIVE 03/03/2009 2310   HGBUR NEGATIVE 03/03/2009 2310   BILIRUBINUR NEGATIVE 03/03/2009 2310   KETONESUR NEGATIVE 03/03/2009 2310   PROTEINUR NEGATIVE 03/03/2009 2310   UROBILINOGEN 0.2 03/03/2009 2310   NITRITE NEGATIVE 03/03/2009 2310   LEUKOCYTESUR  03/03/2009 2310    NEGATIVE MICROSCOPIC NOT DONE ON URINES WITH NEGATIVE PROTEIN, BLOOD, LEUKOCYTES, NITRITE, OR GLUCOSE <1000 mg/dL.   Sepsis Labs Invalid input(s): PROCALCITONIN,  WBC,  LACTICIDVEN Microbiology Recent Results (from the past 240 hour(s))  SARS Coronavirus 2 by RT PCR (hospital order, performed in [facility] hospital lab) Nasopharyngeal Nasopharyngeal Swab     Status: None   Collection Time: 10/27/19 11:07 PM   Specimen:  Nasopharyngeal Swab  Result Value Ref Range Status   SARS Coronavirus 2 NEGATIVE NEGATIVE Final    Comment: (NOTE) SARS-CoV-2 target nucleic acids are NOT DETECTED.  The SARS-CoV-2 RNA is generally detectable in upper and lower respiratory specimens during the acute phase of infection. The lowest concentration of SARS-CoV-2 viral copies this assay can detect is 250 copies / mL. A negative result does not preclude SARS-CoV-2 infection and should not be used as the sole basis for treatment or other patient management decisions.  A negative result may occur with improper specimen collection / handling, submission of specimen other than nasopharyngeal swab, presence of viral mutation(s) within the areas targeted by this assay, and inadequate number of viral copies (<250 copies / mL). A negative result must be combined with clinical observations, patient history, and epidemiological information.  Fact Sheet for Patients:   StrictlyIdeas.no  Fact Sheet for Healthcare Providers: BankingDealers.co.za  This test is not yet approved or  cleared by the [place] FDA and has been authorized for detection and/or diagnosis of SARS-CoV-2 by FDA under an Emergency Use Authorization (EUA).  This EUA will remain in effect (meaning this test can be used) for the duration of the COVID-19 declaration under Section 564(b)(1) of the Act, 21 U.S.C. section 360bbb-3(b)(1), unless the authorization is terminated or revoked sooner.  Performed at [facility], [place] [address] [place], [place] [address]   Surgical pcr screen     Status: Abnormal   Collection Time: 10/28/19  3:15 AM   Specimen: Nasal Mucosa; Nasal Swab  Result Value Ref Range Status   MRSA, PCR NEGATIVE NEGATIVE Final   Staphylococcus aureus POSITIVE (A) NEGATIVE Final    Comment: (NOTE) The Xpert SA Assay (FDA approved for NASAL specimens in patients 40 years of age and older), is  one component of a comprehensive surveillance program. It is not intended to diagnose infection nor to guide or monitor treatment. Performed at [facility], [place] [address], [place], [place] [address]      Time coordinating discharge:  35 minutes  SIGNED:   Barb Merino, MD  [facility] 11/06/2019, 2:45 PM

## 2019-11-06 NOTE — Progress Notes (Signed)
Discharge paperwork in packet for [facility].

## 2019-11-06 NOTE — TOC Progression Note (Addendum)
Transition of Care [facility]) - Progression Note    Patient Details  Name: ALOURA MATSUOKA MRN: [id number] Date of Birth: Feb 06, 1956  Transition of Care [facility]) CM/SW [place], RN Phone Number: 11/06/2019, 2:31 PM  Clinical Narrative:    Case Management received confirmation from [place] that patient is approved for SNF admission to [facility].  I spoke with Lexine Baton, CM at [facility] and she is checking on bed availability.  Insurance authorization approval is Josem Kaufmann # [id number] - ref# [id number] - [place] CM is Carlis Abbott - clinicals can be sent from the facility for review to fax # [phone].  7/16 1600- [facility] confirmed bed availability for the patient and both the patient and sister are aware of patient's transfer to the facility today.  [facility] was called and Murray Hodgkins, primary nurse is calling report to the facility.   Expected Discharge Plan: [place] Barriers to Discharge: Unsafe home situation, Continued Medical Work up  Expected Discharge Plan and Services Expected Discharge Plan: [place]   Discharge Planning Services: CM Consult Post Acute Care Choice: [place] Living arrangements for the past 2 months: Single Family Home                                       Social Determinants of Health (SDOH) Interventions    Readmission Risk Interventions No flowsheet data found.

## 2019-11-06 NOTE — TOC Progression Note (Signed)
Transition of Care [facility]) - Progression Note    Patient Details  Name: April Castillo MRN: [id number] Date of Birth: January 07, 1956  Transition of Care [facility]) CM/SW [place], RN Phone Number: 11/06/2019, 10:13 AM  Clinical Narrative:     Case management met with the patient this morning to confirm choice for SNF placement - [place] called in insurance authorization started with [facility] for choice.  All clinicals faxed to [place] [facility] at [phone].  Ref # [id number].  Will follow.  Expected Discharge Plan: [place] Barriers to Discharge: Unsafe home situation, Continued Medical Work up  Expected Discharge Plan and Services Expected Discharge Plan: [place]   Discharge Planning Services: CM Consult Post Acute Care Choice: [place] Living arrangements for the past 2 months: Single Family Home                     Social Determinants of Health (SDOH) Interventions    Readmission Risk Interventions No flowsheet data found.

## 2019-11-06 NOTE — Progress Notes (Signed)
Report given to [facility].

## 2019-11-06 NOTE — Progress Notes (Signed)
PROGRESS NOTE    April Castillo  VQM:[id number] DOB: 1955/07/24 DOA: 10/27/2019 PCP: Gifford Shave, MD    Brief Narrative:  64 year old female with history of hypertension and hyperlipidemia admitted to the hospital with mechanical fall and bilateral knee pain.  Skeletal survey consistent with right patella fracture and admitted for surgery. She was handling horse, fell on the front concrete with both knees and right hand. This is her third injury in her life related to horse riding. 7/12, waiting to go to acute inpatient rehab. 7/15, insurance declined for rehab.  Exploring options. 7/16, waiting to go to skilled nursing facility.  Assessment & Plan:   Principal Problem:   Closed patellar sleeve fracture of right knee Active Problems:   Depression   Essential hypertension   Hyperlipidemia   Finger fracture, right   Intractable pain  Closed traumatic patella fracture of the right knee: Status post ORIF of the right patella.  Surgically stable as per surgery.  Hinged knee brace and mobility.  Adequate pain medications with bowel regimen. Patient is a excellent candidate for acute inpatient rehab, however her insurance company declined.  Closed traumatic patellar fracture of the left knee with no displacement: Suggested weightbearing as tolerated and pain medications.  Right index finger fracture: Immobilized with splint.  Hypertension: Well-controlled on lisinopril continue.  Hyperlipidemia: On rosuvastatin.  Depression: On fluoxetine that is continued.  Patient is excellent candidate for acute inpatient rehab, peer to peer review done with insurance provider physician and they declined stating that patient does not have medical problems. She will continue to work with PT OT in the hospital.  Waiting for skilled nursing bed availability.  DVT prophylaxis: SCDs Start: 10/29/19 1657 Place TED hose Start: 10/29/19 1657 enoxaparin (LOVENOX) injection 40 mg Start: 10/29/19  1000   Code Status: Full code Family Communication: None today. Disposition Plan: Status is: Inpatient  Remains inpatient appropriate because:Inpatient level of care appropriate due to severity of illness   Dispo: The patient is from: Home              Anticipated d/c is to: Skilled nursing facility.              Anticipated d/c date is: When bed available.              Patient currently is medically stable to transfer to rehab/skilled level of care.   Consultants:   Orthopedics  Procedures:   ORIF right knee, 7/8  Antimicrobials:   None   Subjective: No overnight events.  Pain controlled.  Objective: Vitals:   11/05/19 1514 11/05/19 1930 11/06/19 0408 11/06/19 0800  BP: 109/60 (!) 121/57 (!) 110/56 108/73  Pulse: 95 91 70 63  Resp: 17 18 14 17   Temp: 98.4 F (36.9 C) 98.8 F (37.1 C) 98.4 F (36.9 C) 98.3 F (36.8 C)  TempSrc: Oral Oral Oral Oral  SpO2: 97% 97% 96% 97%  Weight:      Height:        Intake/Output Summary (Last 24 hours) at 11/06/2019 1006 Last data filed at 11/05/2019 1700 Gross per 24 hour  Intake 480 ml  Output --  Net 480 ml   Filed Weights   10/29/19 1244 10/31/19 0500 11/02/19 0500  Weight: 76.5 kg 75.4 kg 75.1 kg    Examination:  Physical Exam Constitutional:      General: She is not in acute distress. HENT:     Head: Atraumatic.  Cardiovascular:     Rate and Rhythm: Normal  rate and regular rhythm.  Pulmonary:     Breath sounds: Normal breath sounds.  Musculoskeletal:     Comments: Right knee on hinged brace, postop incisions clean and dry with minimal swelling. Left knee with minimal swelling. Right index finger on splint.  Neurological:     Mental Status: She is alert.        Data Reviewed: I have personally reviewed following labs and imaging studies  CBC: Recent Labs  Lab 10/31/19 0458 11/01/19 0301 11/02/19 0301  WBC 10.6* 7.2 6.5  HGB 10.2* 10.4* 10.1*  HCT 31.7* 31.6* 31.7*  MCV 98.4 97.8 97.5   PLT 212 223 161   Basic Metabolic Panel: Recent Labs  Lab 10/31/19 0458 11/01/19 0301 11/02/19 0301  NA 141 142 140  K 3.6 3.6 3.8  CL 106 106 106  CO2 25 26 25   GLUCOSE 121* 119* 110*  BUN 11 9 22   CREATININE 0.80 0.67 0.71  CALCIUM 8.5* 8.8* 8.8*   GFR: Estimated Creatinine Clearance: 72.6 mL/min (by C-G formula based on SCr of 0.71 mg/dL). Liver Function Tests: No results for input(s): AST, ALT, ALKPHOS, BILITOT, PROT, ALBUMIN in the last 168 hours. No results for input(s): LIPASE, AMYLASE in the last 168 hours. No results for input(s): AMMONIA in the last 168 hours. Coagulation Profile: No results for input(s): INR, PROTIME in the last 168 hours. Cardiac Enzymes: No results for input(s): CKTOTAL, CKMB, CKMBINDEX, TROPONINI in the last 168 hours. BNP (last 3 results) No results for input(s): PROBNP in the last 8760 hours. HbA1C: No results for input(s): HGBA1C in the last 72 hours. CBG: No results for input(s): GLUCAP in the last 168 hours. Lipid Profile: No results for input(s): CHOL, HDL, LDLCALC, TRIG, CHOLHDL, LDLDIRECT in the last 72 hours. Thyroid Function Tests: No results for input(s): TSH, T4TOTAL, FREET4, T3FREE, THYROIDAB in the last 72 hours. Anemia Panel: No results for input(s): VITAMINB12, FOLATE, FERRITIN, TIBC, IRON, RETICCTPCT in the last 72 hours. Sepsis Labs: No results for input(s): PROCALCITON, LATICACIDVEN in the last 168 hours.  Recent Results (from the past 240 hour(s))  SARS Coronavirus 2 by RT PCR (hospital order, performed in [facility] hospital lab) Nasopharyngeal Nasopharyngeal Swab     Status: None   Collection Time: 10/27/19 11:07 PM   Specimen: Nasopharyngeal Swab  Result Value Ref Range Status   SARS Coronavirus 2 NEGATIVE NEGATIVE Final    Comment: (NOTE) SARS-CoV-2 target nucleic acids are NOT DETECTED.  The SARS-CoV-2 RNA is generally detectable in upper and lower respiratory specimens during the acute phase of infection.  The lowest concentration of SARS-CoV-2 viral copies this assay can detect is 250 copies / mL. A negative result does not preclude SARS-CoV-2 infection and should not be used as the sole basis for treatment or other patient management decisions.  A negative result may occur with improper specimen collection / handling, submission of specimen other than nasopharyngeal swab, presence of viral mutation(s) within the areas targeted by this assay, and inadequate number of viral copies (<250 copies / mL). A negative result must be combined with clinical observations, patient history, and epidemiological information.  Fact Sheet for Patients:   StrictlyIdeas.no  Fact Sheet for Healthcare Providers: BankingDealers.co.za  This test is not yet approved or  cleared by the [place] FDA and has been authorized for detection and/or diagnosis of SARS-CoV-2 by FDA under an Emergency Use Authorization (EUA).  This EUA will remain in effect (meaning this test can be used) for the duration of  the COVID-19 declaration under Section 564(b)(1) of the Act, 21 U.S.C. section 360bbb-3(b)(1), unless the authorization is terminated or revoked sooner.  Performed at [facility], [place] [address] [place], [place] [address]   Surgical pcr screen     Status: Abnormal   Collection Time: 10/28/19  3:15 AM   Specimen: Nasal Mucosa; Nasal Swab  Result Value Ref Range Status   MRSA, PCR NEGATIVE NEGATIVE Final   Staphylococcus aureus POSITIVE (A) NEGATIVE Final    Comment: (NOTE) The Xpert SA Assay (FDA approved for NASAL specimens in patients 7 years of age and older), is one component of a comprehensive surveillance program. It is not intended to diagnose infection nor to guide or monitor treatment. Performed at [facility], [place] [address], [place], [place] [address]          Radiology Studies: No results found.      Scheduled  Meds: . aspirin EC  81 mg Oral Daily  . enoxaparin (LOVENOX) injection  40 mg Subcutaneous Daily  . FLUoxetine  20 mg Oral Daily  . lisinopril  10 mg Oral Daily  . magnesium citrate  0.5 Bottle Oral Once  . rosuvastatin  20 mg Oral Daily  . senna-docusate  1 tablet Oral QHS  . sodium chloride flush  3 mL Intravenous Q12H   Continuous Infusions:    LOS: 9 days    Time spent: 20 minutes    Barb Merino, MD [facility] Pager [phone]

## 2019-11-10 ENCOUNTER — Encounter: Payer: Self-pay | Admitting: Internal Medicine

## 2019-11-10 ENCOUNTER — Non-Acute Institutional Stay (SKILLED_NURSING_FACILITY): Payer: Medicare Other | Admitting: Internal Medicine

## 2019-11-10 DIAGNOSIS — S62650A Nondisplaced fracture of medial phalanx of right index finger, initial encounter for closed fracture: Secondary | ICD-10-CM

## 2019-11-10 DIAGNOSIS — F324 Major depressive disorder, single episode, in partial remission: Secondary | ICD-10-CM

## 2019-11-10 DIAGNOSIS — S82092A Other fracture of left patella, initial encounter for closed fracture: Secondary | ICD-10-CM

## 2019-11-10 DIAGNOSIS — S82091A Other fracture of right patella, initial encounter for closed fracture: Secondary | ICD-10-CM

## 2019-11-10 DIAGNOSIS — I10 Essential (primary) hypertension: Secondary | ICD-10-CM

## 2019-11-10 DIAGNOSIS — E785 Hyperlipidemia, unspecified: Secondary | ICD-10-CM

## 2019-11-10 LAB — BASIC METABOLIC PANEL
BUN: 22 — AB (ref 4–21)
CO2: 21 (ref 13–22)
Chloride: 106 (ref 99–108)
Creatinine: 0.5 (ref 0.5–1.1)
Glucose: 83
Potassium: 4.3 (ref 3.4–5.3)
Sodium: 141 (ref 137–147)

## 2019-11-10 LAB — CBC: RBC: 3.11 — AB (ref 3.87–5.11)

## 2019-11-10 LAB — CBC AND DIFFERENTIAL
HCT: 30 — AB (ref 36–46)
Hemoglobin: 10 — AB (ref 12.0–16.0)
Platelets: 320 (ref 150–399)
WBC: 6.4

## 2019-11-10 LAB — COMPREHENSIVE METABOLIC PANEL
Calcium: 9.1 (ref 8.7–10.7)
GFR calc Af Amer: >90
GFR calc non Af Amer: >90

## 2019-11-10 NOTE — Progress Notes (Signed)
Provider:  Rexene Edison. Mariea Clonts, D.O., C.M.D. Location:  [place] Room Number: [place] of Service:  SNF (31)  PCP: Gifford Shave, MD Patient Care Team: Gifford Shave, MD as PCP - [facility])  Extended Emergency Contact Information Primary Emergency Contact: Sattler,Sarah Address: [address]          [place]  [place] Home Phone: [phone] Mobile Phone: [phone] Relation: Sister Secondary Emergency Contact: [place] Mobile Phone: [phone] Relation: Brother  Code Status: Full code Goals of Care: Advanced Directive information Advanced Directives 11/11/2019  Does Patient Have a Medical Advance Directive? Yes  Type of Advance Directive (No Data)  Does patient want to make changes to medical advance directive? No - Patient declined  Would patient like information on creating a medical advance directive? -   Chief Complaint  Patient presents with  . New Admit To SNF    Admit to SNF    HPI: Patient is a 64 y.o. female seen today for admission to [facility] 7/16 after hospitalization from 7/6-7/16/21 at [facility] with acute displaced right patellar fracture.  She had been cleaning her horses stall at home when something startled the horse and it was moving erratically and frantically.  Unfortunately it knocked her over from a standing position to the floor the bar landing on her right knee and she also hit her left knee on the floor.  The horse stepped on her right index finger as well.  She did not hit her head.  She had been unable to ambulate due to severe pain so she had called EMS and was transported to the emergency department.  She normally lives independently and is able to ambulate without assistance.  She takes baby aspirin. In the ED she had a white count of 10.9 her hemoglobin was 11.6 other labs were unremarkable including a negative Covid screen.  Plain films of her right knee indicated a  displaced fracture and moderate joint effusion.  Left knee films had mild joint effusion.  Plain films of her right hand did reveal an acute fracture of the middle phalanx of her second right finger.  Call orthopedics Dr. Marlou Sa recommended observation with the hospitalist service for improved pain control and evaluation the next morning.  She was given morphine and Percocet.  PT and OT were immediately consulted. Notes indicate this was her third injury related to horseback riding.  She underwent open reduction internal fixation of her right patella with Dr. Marcene Duos on July 8.  She is to complete 2 full weeks of Lovenox and was discharged on day 9 of 14.  She is using a hinged knee brace.  She has Norco and Robaxin for her pain regimen and bowel regimen is in place.  She is weightbearing as tolerated and is here for PT OT at SNF.    Discharge note indicates that she also did have a closed traumatic patellar fracture of the left knee without displacement and is weightbearing as tolerated for it as well.  It appears that this was found on July 9 CT of the left knee where she had linear nondisplaced fractures of the left knee that were subtle on the imaging study.  Her right index finger was immobilized with a splint.  Pain on her outpatient antihypertensive regimen with lisinopril and her rosuvastatin for cholesterol.  She received Lovenox DVT prophylaxis.  She also takes fluoxetine for depression.  She had her covid moderna vaccines 3/25  and 4/22.    When seen, she was doing quite well and eager to get home.  She reports being able to ambulate fine, but since she cannot bend her right leg and cannot really lift it with the heavy brace on it, this makes getting in and out of bed and on and off the toilet challenging.    She's unsure when her f/u appt with ortho is (8/5 at 10:15 with Dr. Marlou Sa per chart) and was requesting something to put under the brace which is starting to rub her skin (has ted for  other leg).  She also wants her hair washed.  Otherwise, she's doing fine, pain is minimal in left knee and right finger.    Past Medical History:  Diagnosis Date  . Depression   . Heart murmur   . Hip fracture, right ([place]) 11/23/2010  . Hypertension   . Kidney stone   . KNEE PAIN, LEFT 11/07/2009  . Other and unspecified hyperlipidemia 01/01/2013  . PERIMENOPAUSAL SYNDROME 11/07/2009  . Rosacea 06/20/2006  . Tension headache 06/20/2006       . UTERINE FIBROID 06/20/2006   Past Surgical History:  Procedure Laterality Date  . CATARACT EXTRACTION W/ INTRAOCULAR LENS IMPLANT Left 08/03/13   Dr. Katy Fitch @ [facility]  . EYE SURGERY Right    Cataract  . ORIF PATELLA Right 10/29/2019   Procedure: OPEN REDUCTION INTERNAL (ORIF) FIXATION PATELLA;  Surgeon: Meredith Pel, MD;  Location: [facility];  Service: Orthopedics;  Laterality: Right;  . r hip fracture and repair  January 24, 2010    Social History   Socioeconomic History  . Marital status: Single    Spouse name: Not on file  . Number of children: Not on file  . Years of education: Not on file  . Highest education level: Not on file  Occupational History  . 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 Smoking status: Never Smoker  . Smokeless tobacco: Never Used  Substance and Sexual Activity  . Alcohol use: Yes    Alcohol/week: 3.0 standard drinks    Types: 1 Glasses of wine, 2 Cans of beer per week    Comment: few drinks a week  . Drug use: No  . Sexual activity: Yes    Birth control/protection: None  Other Topics Concern  . Not on file  Social History Narrative   In relationship with female partner who lives in [place].  Works as travel Music therapist, job has become less stressful.     Social Determinants of Health   Financial Resource Strain:   . Difficulty of Paying Living Expenses:   Food Insecurity:   . Worried About Charity fundraiser in the Last Year:   . Arboriculturist in the Last Year:     Transportation Needs:   . Film/video editor (Medical):   Marland Kitchen Lack of Transportation (Non-Medical):   Physical Activity:   . Days of Exercise per Week:   . Minutes of Exercise per Session:   Stress:   . Feeling of Stress :   Social Connections:   . Frequency of Communication with Friends and Family:   . Frequency of Social Gatherings with Friends and Family:   . Attends Religious Services:   . Active Member of Clubs or Organizations:   . Attends Archivist Meetings:   Marland Kitchen Marital Status:     reports that she has never smoked. She has never used  smokeless tobacco. She reports current alcohol use of about 3.0 standard drinks of alcohol per week. She reports that she does not use drugs.  Functional Status Survey:    Family History  Problem Relation Age of Onset  . Cancer Father   . Hypertension Father   . Cancer Sister   . Thyroid disease Sister   . Breast cancer Sister   . Cancer Maternal Uncle   . Diabetes Neg Hx   . Heart disease Neg Hx     Health Maintenance  Topic Date Due  . COVID-19 Vaccine (1) Never done  . PAP SMEAR-Modifier  06/22/2014  . MAMMOGRAM  03/13/2016  . DEXA SCAN  Never done  . PNA vac Low Risk Adult (1 of 2 - PCV13) Never done  . INFLUENZA VACCINE  11/22/2019  . COLONOSCOPY  03/11/2022  . TETANUS/TDAP  02/01/2025  . Hepatitis C Screening  Completed  . HIV Screening  Completed    No Known Allergies  Outpatient Encounter Medications as of 11/10/2019  Medication Sig  . alendronate (FOSAMAX) 70 MG tablet Take 70 mg by mouth once a week. Take with a full glass of water on an empty stomach.  Marland Kitchen aspirin EC 81 MG tablet Take 81 mg by mouth daily. Swallow whole.  . enoxaparin (LOVENOX) 40 MG/0.4ML injection Inject 0.4 mLs (40 mg total) into the skin daily for 5 days.  Marland Kitchen FLUoxetine (PROZAC) 20 MG capsule Take 1 capsule (20 mg total) by mouth daily.  Marland Kitchen lisinopril (PRINIVIL,ZESTRIL) 10 MG tablet TAKE 1 TABLET DAILY  . methocarbamol (ROBAXIN) 500  MG tablet Take 1 tablet (500 mg total) by mouth every 6 (six) hours as needed for muscle spasms.  . [EXPIRED] oxyCODONE-acetaminophen (PERCOCET/ROXICET) 5-325 MG tablet Take 1 tablet by mouth every 6 (six) hours as needed for up to 5 days for moderate pain.  . rosuvastatin (CRESTOR) 20 MG tablet Take 20 mg by mouth daily.  . [DISCONTINUED] ibuprofen (ADVIL,MOTRIN) 800 MG tablet Take 800 mg by mouth every 8 (eight) hours as needed. for low back pain  . [DISCONTINUED] alendronate (FOSAMAX) 70 MG tablet Take 70 mg by mouth once a week. Take with a full glass of water on an empty stomach.   No facility-administered encounter medications on file as of 11/10/2019.    Review of Systems  Constitutional: Negative for chills and fever.  HENT: Negative for congestion and hearing loss.   Eyes: Negative for blurred vision.  Respiratory: Negative for cough and shortness of breath.   Cardiovascular: Positive for leg swelling. Negative for chest pain and palpitations.  Gastrointestinal: Negative for abdominal pain, blood in stool, constipation and melena.  Genitourinary: Negative for dysuria, frequency and urgency.  Musculoskeletal: Positive for falls and joint pain.  Skin: Negative for itching and rash.  Neurological: Negative for dizziness and loss of consciousness.  Psychiatric/Behavioral: Negative for depression and memory loss. The patient is not nervous/anxious and does not have insomnia.     Vitals:   11/10/19 1434  BP: 110/64  Pulse: 83  Temp: 98.1 F (36.7 C)  Weight: 165 lb 9.1 oz (75.1 kg)  Height: 5\' 5"  (1.651 m)   Body mass index is 27.55 kg/m. Physical Exam Vitals reviewed.  Constitutional:      General: She is not in acute distress.    Appearance: Normal appearance. She is not toxic-appearing.  HENT:     Head: Normocephalic and atraumatic.     Right Ear: External ear normal.     Left Ear:  External ear normal.     Nose: Nose normal.     Mouth/Throat:     Pharynx: Oropharynx  is clear.  Eyes:     Extraocular Movements: Extraocular movements intact.     Conjunctiva/sclera: Conjunctivae normal.     Pupils: Pupils are equal, round, and reactive to light.  Cardiovascular:     Rate and Rhythm: Normal rate and regular rhythm.     Pulses: Normal pulses.     Heart sounds: Normal heart sounds.  Pulmonary:     Effort: Pulmonary effort is normal.     Breath sounds: No wheezing, rhonchi or rales.  Musculoskeletal:     Right lower leg: Edema present.     Left lower leg: Edema present.     Comments: Unable to flex her right hip due to weight of brace, has to use her arms to lift the right leg at this point, able to ambulate with walker (has most equipment at home from prior hip surgery and from family members)  Skin:    General: Skin is warm and dry.     Capillary Refill: Capillary refill takes less than 2 seconds.     Comments: Right knee with some mild bleeding visible thru her dressing, mild swelling and ecchymoes, wearing brace as directed; left knee also mildly swollen; right finger in splint with ecchymoses and swelling  Neurological:     General: No focal deficit present.     Mental Status: She is alert and oriented to person, place, and time.     Labs reviewed: Basic Metabolic Panel: Recent Labs    10/28/19 0423 10/29/19 0405 10/31/19 0458 10/31/19 0458 11/01/19 0301 11/02/19 0301 11/10/19 0000  NA 139   < > 141   < > 142 140 141  K 4.1   < > 3.6   < > 3.6 3.8 4.3  CL 104   < > 106   < > 106 106 106  CO2 25   < > 25   < > 26 25 21   GLUCOSE 108*   < > 121*  --  119* 110*  --   BUN 20   < > 11   < > 9 22 22*  CREATININE 0.80   < > 0.80   < > 0.67 0.71 0.5  CALCIUM 9.0   < > 8.5*   < > 8.8* 8.8* 9.1  MG 2.3  --   --   --   --   --   --    < > = values in this interval not displayed.   Liver Function Tests: No results for input(s): AST, ALT, ALKPHOS, BILITOT, PROT, ALBUMIN in the last 8760 hours. No results for input(s): LIPASE, AMYLASE in the last  8760 hours. No results for input(s): AMMONIA in the last 8760 hours. CBC: Recent Labs    10/28/19 0423 10/29/19 0405 10/31/19 0458 10/31/19 0458 11/01/19 0301 11/02/19 0301 11/10/19 0000  WBC 6.7   < > 10.6*   < > 7.2 6.5 6.4  NEUTROABS 4.6  --   --   --   --   --   --   HGB 11.2*   < > 10.2*   < > 10.4* 10.1* 10.0*  HCT 35.2*   < > 31.7*   < > 31.6* 31.7* 30*  MCV 97.8   < > 98.4  --  97.8 97.5  --   PLT 208   < > 212   < > 223 240  320   < > = values in this interval not displayed.   Cardiac Enzymes: No results for input(s): CKTOTAL, CKMB, CKMBINDEX, TROPONINI in the last 8760 hours. BNP: Invalid input(s): POCBNP Lab Results  Component Value Date   HGBA1C 5.4 09/02/2015   Lab Results  Component Value Date   TSH 1.053 02/02/2015   No results found for: VITAMINB12 No results found for: FOLATE No results found for: IRON, TIBC, FERRITIN  Imaging and Procedures obtained prior to SNF admission: DG Knee Complete 4 Views Left  Result Date: 10/27/2019 CLINICAL DATA:  Status post trauma. EXAM: LEFT KNEE - COMPLETE 4+ VIEW COMPARISON:  None. FINDINGS: No evidence of acute fracture or dislocation. No evidence of arthropathy or other focal bone abnormality. There is a small joint effusion. IMPRESSION: No acute fracture or dislocation. Small joint effusion. Electronically Signed   By: Virgina Norfolk M.D.   On: 10/27/2019 19:31   DG Knee Complete 4 Views Right  Result Date: 10/27/2019 CLINICAL DATA:  64 year old female with fall bilateral knee pain. EXAM: RIGHT KNEE - COMPLETE 4+ VIEW COMPARISON:  Left knee radiograph dated 10/27/2019. FINDINGS: There is a comminuted fracture of the patella with 3 main displaced fracture fragments. There is no dislocation. The bones are osteopenic. There is a moderate joint effusion. The soft tissue swelling of the anterior knee. No radiopaque foreign object or soft tissue gas. IMPRESSION: 1. Displaced patellar fractures. No dislocation. 2. Moderate  joint effusion. Electronically Signed   By: Anner Crete M.D.   On: 10/27/2019 19:30   DG Hand Complete Right  Result Date: 10/27/2019 CLINICAL DATA:  Status post trauma. EXAM: RIGHT HAND - COMPLETE 3+ VIEW COMPARISON:  None. FINDINGS: Acute fracture is seen involving the middle phalanx of the second right finger. There is no evidence of dislocation. A radiopaque ring (i.e. Jewelry) is seen overlying the proximal phalanx of the fourth right finger. A radiopaque pulse on motor is seen overlying the distal phalanx of the fourth right finger. There is no evidence of arthropathy or other focal bone abnormality. Soft tissues are unremarkable. IMPRESSION: Acute fracture of the middle phalanx of the second right finger. Electronically Signed   By: Virgina Norfolk M.D.   On: 10/27/2019 19:29    Assessment/Plan 1. Closed sleeve fracture of right patella, initial encounter -cont brace, PT, OT, and f/u with Dr. Marlou Sa as planned, eager to go home soon -minimal pain--had advil initially and percocet, but no longer using, has robaxin for muscle spasms -asked nurse to get her a ted for her right leg also  2. Closed nondisplaced fracture of middle phalanx of right index finger, initial encounter -in splint, monitor  3. Closed patellar sleeve fracture, left, initial encounter -did not require intervention due to small crack only, minimal pain, monitor  4. Depression, major, single episode, in partial remission ([place]) -remains stable on her prozac despite recent events  5. Hyperlipidemia, unspecified hyperlipidemia type -continues on crestor 20mg  daily for this  6. Essential hypertension -bp controlled on lisinopril 10mg  daily, cont same and monitor  Family/ staff Communication: discussed with snf nurse  Labs/tests ordered:  Cbc, bmp, ortho f/u  Saleh Ulbrich L. Fernando Stoiber, D.O. [facility] [address], [place] [address] Cell Phone (Mon-Fri 8am-5pm):   [phone] On Call:  [phone] & follow prompts after 5pm & weekends Office Phone:  [phone] Office Fax:  [phone]

## 2019-11-11 ENCOUNTER — Encounter: Payer: Self-pay | Admitting: Family

## 2019-11-11 ENCOUNTER — Non-Acute Institutional Stay (SKILLED_NURSING_FACILITY): Payer: Medicare Other | Admitting: Family

## 2019-11-11 DIAGNOSIS — S82092D Other fracture of left patella, subsequent encounter for closed fracture with routine healing: Secondary | ICD-10-CM | POA: Diagnosis not present

## 2019-11-11 DIAGNOSIS — R799 Abnormal finding of blood chemistry, unspecified: Secondary | ICD-10-CM

## 2019-11-11 DIAGNOSIS — D649 Anemia, unspecified: Secondary | ICD-10-CM | POA: Diagnosis not present

## 2019-11-11 DIAGNOSIS — S82091D Other fracture of right patella, subsequent encounter for closed fracture with routine healing: Secondary | ICD-10-CM

## 2019-11-11 NOTE — Progress Notes (Signed)
Location:    [facility] Room Number: 105/P Place of Service:  SNF (31) Provider:  Marlowe Sax NP  Gifford Shave, MD  Patient Care Team: Gifford Shave, MD as PCP - [facility])  Extended Emergency Contact Information Primary Emergency Contact: Commons,Sarah Address: [address]          [place],  [place] Home Phone: [phone] Mobile Phone: [phone] Relation: Sister Secondary Emergency Contact: [place] Mobile Phone: [phone] Relation: Brother  Code Status:  Full Code Goals of care: Advanced Directive information Advanced Directives 11/11/2019  Does Patient Have a Medical Advance Directive? Yes  Type of Advance Directive (No Data)  Does patient want to make changes to medical advance directive? No - Patient declined  Would patient like information on creating a medical advance directive? -     Chief Complaint  Patient presents with  . Acute Visit    Abnormal Labs    HPI:  Pt is a 64 y.o. female seen today for an acute visit for follow up abnormal lab results. Hgb 10.0,BUN 22,CR 0.53 (11/10/2019).Previous Hgb 10.1 she is status post closed displaced comminuted fracture of right patella and closed nondisplaced fracture of middle phalanx of right index finger post fall after her horse knocked her down from a standing position.she is status post ORIF.She states pain is under control.she denies any signs of bleeding.    Past Medical History:  Diagnosis Date  . Depression   . Heart murmur   . Hip fracture, right ([place]) 11/23/2010  . Hypertension   . Kidney stone   . KNEE PAIN, LEFT 11/07/2009  . Other and unspecified hyperlipidemia 01/01/2013  . PERIMENOPAUSAL SYNDROME 11/07/2009  . Rosacea 06/20/2006  . Tension headache 06/20/2006       . UTERINE FIBROID 06/20/2006   Past Surgical History:  Procedure Laterality Date  . CATARACT EXTRACTION W/ INTRAOCULAR LENS IMPLANT Left 08/03/13   Dr. Katy Fitch @ [facility] [place]  .  EYE SURGERY Right    Cataract  . ORIF PATELLA Right 10/29/2019   Procedure: OPEN REDUCTION INTERNAL (ORIF) FIXATION PATELLA;  Surgeon: Meredith Pel, MD;  Location: [facility];  Service: Orthopedics;  Laterality: Right;  . r hip fracture and repair  January 24, 2010    No Known Allergies  Allergies as of 11/11/2019   No Known Allergies     Medication List       Accurate as of November 11, 2019  1:08 PM. If you have any questions, ask your nurse or doctor.        STOP taking these medications   ibuprofen 800 MG tablet Commonly known as: ADVIL Stopped by: Sandrea Hughs, NP     TAKE these medications   alendronate 70 MG tablet Commonly known as: FOSAMAX Take 70 mg by mouth once a week. Take with a full glass of water on an empty stomach.   aspirin EC 81 MG tablet Take 81 mg by mouth daily. Swallow whole.   enoxaparin 40 MG/0.4ML injection Commonly known as: LOVENOX Inject 0.4 mLs (40 mg total) into the skin daily for 5 days.   FLUoxetine 20 MG capsule Commonly known as: PROZAC Take 1 capsule (20 mg total) by mouth daily.   lisinopril 10 MG tablet Commonly known as: ZESTRIL TAKE 1 TABLET DAILY   methocarbamol 500 MG tablet Commonly known as: ROBAXIN Take 1 tablet (500 mg total) by mouth every 6 (six) hours as needed for muscle spasms.   NON  FORMULARY DIET: REGULAR, NAS, HEART HEALTHY   oxyCODONE-acetaminophen 5-325 MG tablet Commonly known as: PERCOCET/ROXICET Take 1 tablet by mouth every 6 (six) hours as needed for severe pain. For up to 5 days   rosuvastatin 20 MG tablet Commonly known as: CRESTOR Take 20 mg by mouth daily.       Review of Systems  Constitutional: Negative for appetite change, chills, fatigue and fever.  Eyes: Negative for discharge, redness and itching.  Respiratory: Negative for cough, chest tightness, shortness of breath and wheezing.   Cardiovascular: Positive for leg swelling. Negative for chest pain and palpitations.    Gastrointestinal: Negative for abdominal distention, abdominal pain, constipation, diarrhea, nausea and vomiting.  Musculoskeletal: Positive for gait problem. Negative for myalgias and neck pain.       Right knee pain   Skin: Negative for color change, pallor and rash.  Neurological: Negative for dizziness, speech difficulty, weakness, light-headedness, numbness and headaches.  Hematological: Does not bruise/bleed easily.  Psychiatric/Behavioral: Negative for agitation, behavioral problems and sleep disturbance. The patient is not nervous/anxious.     Immunization History  Administered Date(s) Administered  . Influenza,inj,Quad PF,6+ Mos 01/01/2013, 02/02/2015, 02/10/2016, 05/29/2017  . Influenza-Unspecified 01/01/2013, 02/02/2015, 02/02/2015, 02/10/2016, 05/29/2017, 03/10/2018  . Tdap 02/02/2015   Pertinent  Health Maintenance Due  Topic Date Due  . PAP SMEAR-Modifier  06/22/2014  . MAMMOGRAM  03/13/2016  . DEXA SCAN  Never done  . PNA vac Low Risk Adult (1 of 2 - PCV13) Never done  . INFLUENZA VACCINE  11/22/2019  . COLONOSCOPY  03/11/2022   Fall Risk  11/07/2017 02/10/2016 08/29/2015 08/24/2015 02/02/2015  Falls in the past year? No No No No No    Vitals:   11/11/19 1301  BP: 126/77  Pulse: 73  Resp: 18  Temp: (!) 97.3 F (36.3 C)  TempSrc: Oral  Weight: 165 lb 6.4 oz (75 kg)  Height: 5\' 6"  (1.676 m)   Body mass index is 26.7 kg/m. Physical Exam Vitals reviewed.  Constitutional:      General: She is not in acute distress.    Appearance: She is overweight. She is not ill-appearing.  HENT:     Head: Normocephalic.     Nose: Nose normal. No congestion or rhinorrhea.     Mouth/Throat:     Mouth: Mucous membranes are moist.     Pharynx: Oropharynx is clear. No oropharyngeal exudate or posterior oropharyngeal erythema.  Eyes:     General: No scleral icterus.       Right eye: No discharge.        Left eye: No discharge.     Conjunctiva/sclera: Conjunctivae normal.      Pupils: Pupils are equal, round, and reactive to light.  Cardiovascular:     Rate and Rhythm: Normal rate and regular rhythm.     Pulses: Normal pulses.     Heart sounds: Normal heart sounds. No murmur heard.  No friction rub. No gallop.   Pulmonary:     Effort: Pulmonary effort is normal. No respiratory distress.     Breath sounds: Normal breath sounds. No wheezing, rhonchi or rales.  Chest:     Chest wall: No tenderness.  Abdominal:     General: Bowel sounds are normal. There is no distension.     Palpations: Abdomen is soft. There is no mass.     Tenderness: There is no abdominal tenderness. There is no right CVA tenderness, left CVA tenderness, guarding or rebound.  Musculoskeletal:  Cervical back: Normal range of motion. No rigidity or tenderness.     Right lower leg: Edema present.     Left lower leg: Edema present.     Comments: Right leg full leg brace in place unable to move leg has to lift with both hands.   Lymphadenopathy:     Cervical: No cervical adenopathy.  Skin:    General: Skin is warm.     Coloration: Skin is not pale.     Findings: No bruising, erythema or rash.     Comments: Right knee surgical incision without any signs of infection.  Neurological:     Mental Status: She is alert and oriented to person, place, and time.     Cranial Nerves: No cranial nerve deficit.     Sensory: No sensory deficit.     Motor: No weakness.     Coordination: Coordination normal.     Gait: Gait abnormal.  Psychiatric:        Mood and Affect: Mood normal.        Behavior: Behavior normal.        Thought Content: Thought content normal.        Judgment: Judgment normal.     Labs reviewed: Recent Labs    10/28/19 0423 10/29/19 0405 10/31/19 0458 10/31/19 0458 11/01/19 0301 11/02/19 0301 11/10/19 0000  NA 139   < > 141   < > 142 140 141  K 4.1   < > 3.6   < > 3.6 3.8 4.3  CL 104   < > 106   < > 106 106 106  CO2 25   < > 25   < > 26 25 21   GLUCOSE 108*   < >  121*  --  119* 110*  --   BUN 20   < > 11   < > 9 22 22*  CREATININE 0.80   < > 0.80   < > 0.67 0.71 0.5  CALCIUM 9.0   < > 8.5*   < > 8.8* 8.8* 9.1  MG 2.3  --   --   --   --   --   --    < > = values in this interval not displayed.    Recent Labs    10/28/19 0423 10/29/19 0405 10/31/19 0458 10/31/19 0458 11/01/19 0301 11/02/19 0301 11/10/19 0000  WBC 6.7   < > 10.6*   < > 7.2 6.5 6.4  NEUTROABS 4.6  --   --   --   --   --   --   HGB 11.2*   < > 10.2*   < > 10.4* 10.1* 10.0*  HCT 35.2*   < > 31.7*   < > 31.6* 31.7* 30*  MCV 97.8   < > 98.4  --  97.8 97.5  --   PLT 208   < > 212   < > 223 240 320   < > = values in this interval not displayed.   Lab Results  Component Value Date   TSH 1.053 02/02/2015   Lab Results  Component Value Date   HGBA1C 5.4 09/02/2015   Lab Results  Component Value Date   CHOL 224 (H) 05/29/2017   HDL 53 05/29/2017   LDLCALC 151 (H) 05/29/2017   LDLDIRECT 135 (H) 11/07/2009   TRIG 101 05/29/2017   CHOLHDL 4.2 05/29/2017    Significant Diagnostic Results in last 30 days:  DG Knee 1-2 Views Right  Result  Date: 10/29/2019 CLINICAL DATA:  ORIF right patella fracture. EXAM: RIGHT KNEE - 1-2 VIEW; DG C-ARM 1-60 MIN COMPARISON:  Radiograph 10/27/2019 FINDINGS: Two fluoroscopic spot views of the right knee obtained in the operating room. Patellar fracture. No hardware is visualized on the provided views. Total fluoroscopy time 8 seconds. Total dose 0.34 mGy. IMPRESSION: Intraoperative fluoroscopy during right knee patellar surgery. Electronically Signed   By: Keith Rake M.D.   On: 10/29/2019 18:46   CT KNEE LEFT WO CONTRAST  Result Date: 10/30/2019 CLINICAL DATA:  Known right knee fracture, query initially radiographically occult injury of the left knee. EXAM: CT OF THE left KNEE WITHOUT CONTRAST TECHNIQUE: Multidetector CT imaging of the left knee was performed according to the standard protocol. Multiplanar CT image reconstructions were also  generated. COMPARISON:  Radiograph 10/27/2019 FINDINGS: Bones/Joint/Cartilage Subtle linear nondisplaced fractures of the left knee. One of these extends obliquely along the inferomedial pole of the left patella as shown on image 67/6. There also appears to be a subtle linear vertically oriented fracture plane at the junction of the middle and lateral patellar thirds as shown on image 72/6. There is considerable degenerative articular cartilage thinning in the medial compartment and moderate degenerative chondral thinning in the lateral compartment causing joint space narrowing. No other fractures around the knee are identified. Small to moderate-sized hemarthrosis. Ligaments Suboptimally assessed by CT. Structures with expected contour for the ACL and PCL are observed. Muscles and Tendons Unremarkable Soft tissues Subcutaneous edema anterior to the patellar tendon and along the patellar retinacula, lateral greater than medial. IMPRESSION: 1. Subtle linear nondisplaced fractures of the left patella. One of these extends obliquely along the inferomedial pole of the left patella. There also appears to be a subtle linear vertically oriented fracture plane at the junction of the middle and lateral patellar thirds. 2. Small to moderate-sized hemarthrosis. 3. Subcutaneous edema anterior to the patellar tendon and along the patellar retinacula, lateral greater than medial. 4. Considerable degenerative articular cartilage thinning in the medial compartment and moderate degenerative chondral thinning in the lateral compartment. Electronically Signed   By: Van Clines M.D.   On: 10/30/2019 06:11   DG Knee Complete 4 Views Left  Result Date: 10/27/2019 CLINICAL DATA:  Status post trauma. EXAM: LEFT KNEE - COMPLETE 4+ VIEW COMPARISON:  None. FINDINGS: No evidence of acute fracture or dislocation. No evidence of arthropathy or other focal bone abnormality. There is a small joint effusion. IMPRESSION: No acute fracture  or dislocation. Small joint effusion. Electronically Signed   By: Virgina Norfolk M.D.   On: 10/27/2019 19:31   DG Knee Complete 4 Views Right  Result Date: 10/27/2019 CLINICAL DATA:  64 year old female with fall bilateral knee pain. EXAM: RIGHT KNEE - COMPLETE 4+ VIEW COMPARISON:  Left knee radiograph dated 10/27/2019. FINDINGS: There is a comminuted fracture of the patella with 3 main displaced fracture fragments. There is no dislocation. The bones are osteopenic. There is a moderate joint effusion. The soft tissue swelling of the anterior knee. No radiopaque foreign object or soft tissue gas. IMPRESSION: 1. Displaced patellar fractures. No dislocation. 2. Moderate joint effusion. Electronically Signed   By: Anner Crete M.D.   On: 10/27/2019 19:30   DG Knee Right Port  Result Date: 10/29/2019 CLINICAL DATA:  Postop right patellar fracture. EXAM: PORTABLE RIGHT KNEE - 1-2 VIEW COMPARISON:  Preoperative radiograph 09/28/2019 FINDINGS: Debridement of the lower patellar pole. Few comminuted fragments inferiorly. Recent postsurgical change includes air and edema in the joint  space and soft tissues. Tibiofemoral alignment is maintained. IMPRESSION: Postsurgical fixation of patellar fracture. Electronically Signed   By: Keith Rake M.D.   On: 10/29/2019 18:48   DG Hand Complete Right  Result Date: 10/27/2019 CLINICAL DATA:  Status post trauma. EXAM: RIGHT HAND - COMPLETE 3+ VIEW COMPARISON:  None. FINDINGS: Acute fracture is seen involving the middle phalanx of the second right finger. There is no evidence of dislocation. A radiopaque ring (i.e. Jewelry) is seen overlying the proximal phalanx of the fourth right finger. A radiopaque pulse on motor is seen overlying the distal phalanx of the fourth right finger. There is no evidence of arthropathy or other focal bone abnormality. Soft tissues are unremarkable. IMPRESSION: Acute fracture of the middle phalanx of the second right finger. Electronically  Signed   By: Virgina Norfolk M.D.   On: 10/27/2019 19:29   DG C-Arm 1-60 Min  Result Date: 10/29/2019 CLINICAL DATA:  ORIF right patella fracture. EXAM: RIGHT KNEE - 1-2 VIEW; DG C-ARM 1-60 MIN COMPARISON:  Radiograph 10/27/2019 FINDINGS: Two fluoroscopic spot views of the right knee obtained in the operating room. Patellar fracture. No hardware is visualized on the provided views. Total fluoroscopy time 8 seconds. Total dose 0.34 mGy. IMPRESSION: Intraoperative fluoroscopy during right knee patellar surgery. Electronically Signed   By: Keith Rake M.D.   On: 10/29/2019 18:46    Assessment/Plan 1. Anemia, unspecified type Hgb low status post-op.will continue to monitor H/H  - CBC 11/16/2019    2. Closed sleeve fracture of right patella with routine healing, subsequent encounter Status post right knee ORIF  - pain under control continue on Robaxin  -continue PT/OT    3. Closed patellar sleeve fracture, left, encounter Continue on robaxin -continue PT/OT  4.Elevated BUN  BUN slightly high with normal SCr. Encourage to increase fluid intake.  Repeat BMP 11/16/2019   Family/ staff Communication: Reviewed plan of care with patient and facility Nurse   Labs/tests ordered:   - CBC,BMP 11/16/2019

## 2019-11-12 ENCOUNTER — Ambulatory Visit: Payer: Self-pay

## 2019-11-12 ENCOUNTER — Ambulatory Visit (INDEPENDENT_AMBULATORY_CARE_PROVIDER_SITE_OTHER): Payer: Medicare Other

## 2019-11-12 ENCOUNTER — Ambulatory Visit (INDEPENDENT_AMBULATORY_CARE_PROVIDER_SITE_OTHER): Payer: Medicare Other | Admitting: Orthopedic Surgery

## 2019-11-12 DIAGNOSIS — S62650A Nondisplaced fracture of medial phalanx of right index finger, initial encounter for closed fracture: Secondary | ICD-10-CM

## 2019-11-12 DIAGNOSIS — M25561 Pain in right knee: Secondary | ICD-10-CM | POA: Diagnosis not present

## 2019-11-12 DIAGNOSIS — S82092A Other fracture of left patella, initial encounter for closed fracture: Secondary | ICD-10-CM | POA: Diagnosis not present

## 2019-11-12 DIAGNOSIS — M79641 Pain in right hand: Secondary | ICD-10-CM | POA: Diagnosis not present

## 2019-11-12 DIAGNOSIS — S82091D Other fracture of right patella, subsequent encounter for closed fracture with routine healing: Secondary | ICD-10-CM

## 2019-11-14 ENCOUNTER — Encounter: Payer: Self-pay | Admitting: Orthopedic Surgery

## 2019-11-14 NOTE — Progress Notes (Signed)
Post-Op Visit Note   Patient: April Castillo           Date of Birth: Nov 21, 1955           MRN: [id number] Visit Date: 11/12/2019 PCP: Gifford Shave, MD   Assessment & Plan:  Chief Complaint:  Chief Complaint  Patient presents with  . Right Knee - Routine Post Op  . Right Hand - Follow-up   Visit Diagnoses:  1. Closed sleeve fracture of right patella with routine healing, subsequent encounter   2. Closed nondisplaced fracture of middle phalanx of right index finger, initial encounter   3. Closed patellar sleeve fracture, left, initial encounter     Plan: Kleo is a patient with right patella fracture open reduction internal fixation 10/29/2019.  We had to excise a completely devitalized piece of bone which had no soft tissue attachments.  The superior aspect of the patella tendon was sutured into the remaining patella which is about two thirds of the original patella itself.  She also has right index finger fracture.  That has been in a splint stopping at the proximal finger crease.  She also has a left patella fracture.  She has been in a Bledsoe brace on the right-hand side.  On examination she has well-healed incision on the right-hand side.  No calf tenderness negative Homans bilaterally.  Left knee has excellent extension against gravity.  Radiographs show a little bit of change in fracture alignment compared to immediate postop radiograph.  For that reason I like to keep that right leg straight in the Bledsoe brace.  Okay to weight-bear.  Wrote a prescription for bilateral compression hose.  Continue with aspirin for DVT prophylaxis.  Regarding the finger will go to buddy tape the index to the long finger and start some motion.  No lifting with that right hand.  Come back in 2 weeks for clinical recheck and radiographic evaluation lateral view only on the right knee.  Follow-Up Instructions: Return in about 2 weeks (around 11/26/2019).   Orders:  Orders Placed This Encounter    Procedures  . XR Knee 1-2 Views Right  . XR Finger Index Right  . XR Knee 1-2 Views Left   No orders of the defined types were placed in this encounter.   Imaging: No results found.  PMFS History: Patient Active Problem List   Diagnosis Date Noted  . Closed patellar sleeve fracture, left, initial encounter 11/10/2019  . Closed patellar sleeve fracture of right knee 10/28/2019  . Finger fracture, right 10/28/2019  . Conjunctivitis 11/07/2017  . Hyperlipidemia 01/01/2013  . Allergic rhinitis 12/12/2011  . Breast mass, right 11/23/2010  . WEIGHT GAIN 11/07/2009  . Depression, major, single episode, in partial remission ([place]) 09/26/2006  . Essential hypertension 09/26/2006   Past Medical History:  Diagnosis Date  . Depression   . Heart murmur   . Hip fracture, right ([facility]) 11/23/2010  . Hypertension   . Kidney stone   . KNEE PAIN, LEFT 11/07/2009  . Other and unspecified hyperlipidemia 01/01/2013  . PERIMENOPAUSAL SYNDROME 11/07/2009  . Rosacea 06/20/2006  . Tension headache 06/20/2006       . UTERINE FIBROID 06/20/2006    Family History  Problem Relation Age of Onset  . Cancer Father   . Hypertension Father   . Cancer Sister   . Thyroid disease Sister   . Breast cancer Sister   . Cancer Maternal Uncle   . Diabetes Neg Hx   . Heart disease Neg  Hx     Past Surgical History:  Procedure Laterality Date  . CATARACT EXTRACTION W/ INTRAOCULAR LENS IMPLANT Left 08/03/13   Dr. Katy Fitch @ [facility] [place]  . EYE SURGERY Right    Cataract  . ORIF PATELLA Right 10/29/2019   Procedure: OPEN REDUCTION INTERNAL (ORIF) FIXATION PATELLA;  Surgeon: Meredith Pel, MD;  Location: [facility];  Service: Orthopedics;  Laterality: Right;  . r hip fracture and repair  January 24, 2010   Social History   Occupational History  . 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 Smoking status: Never Smoker  . Smokeless tobacco: Never Used  Substance and Sexual Activity  . Alcohol use:  Yes    Alcohol/week: 3.0 standard drinks    Types: 1 Glasses of wine, 2 Cans of beer per week    Comment: few drinks a week  . Drug use: No  . Sexual activity: Yes    Birth control/protection: None

## 2019-11-16 LAB — BASIC METABOLIC PANEL
BUN: 18 (ref 4–21)
CO2: 21 (ref 13–22)
Chloride: 106 (ref 99–108)
Creatinine: 0.6 (ref 0.5–1.1)
Glucose: 81
Potassium: 4.3 (ref 3.4–5.3)
Sodium: 141 (ref 137–147)

## 2019-11-16 LAB — CBC AND DIFFERENTIAL
HCT: 32 — AB (ref 36–46)
Hemoglobin: 10.6 — AB (ref 12.0–16.0)
Platelets: 306 (ref 150–399)
WBC: 5.4

## 2019-11-16 LAB — CBC: RBC: 3.3 — AB (ref 3.87–5.11)

## 2019-11-16 LAB — COMPREHENSIVE METABOLIC PANEL: Calcium: 8.9 (ref 8.7–10.7)

## 2019-11-18 ENCOUNTER — Encounter: Payer: Self-pay | Admitting: Family

## 2019-11-18 ENCOUNTER — Non-Acute Institutional Stay (SKILLED_NURSING_FACILITY): Payer: Medicare Other | Admitting: Family

## 2019-11-18 DIAGNOSIS — R2681 Unsteadiness on feet: Secondary | ICD-10-CM | POA: Diagnosis not present

## 2019-11-18 DIAGNOSIS — S82092D Other fracture of left patella, subsequent encounter for closed fracture with routine healing: Secondary | ICD-10-CM | POA: Diagnosis not present

## 2019-11-18 DIAGNOSIS — F324 Major depressive disorder, single episode, in partial remission: Secondary | ICD-10-CM

## 2019-11-18 DIAGNOSIS — S82091D Other fracture of right patella, subsequent encounter for closed fracture with routine healing: Secondary | ICD-10-CM | POA: Diagnosis not present

## 2019-11-18 DIAGNOSIS — S62662D Nondisplaced fracture of distal phalanx of right middle finger, subsequent encounter for fracture with routine healing: Secondary | ICD-10-CM

## 2019-11-18 DIAGNOSIS — I10 Essential (primary) hypertension: Secondary | ICD-10-CM

## 2019-11-18 DIAGNOSIS — E785 Hyperlipidemia, unspecified: Secondary | ICD-10-CM

## 2019-11-18 MED ORDER — METHOCARBAMOL 500 MG PO TABS: 500.0000 mg | ORAL_TABLET | Freq: Four times a day (QID) | ORAL | 0 refills | Status: DC | PRN

## 2019-11-18 NOTE — Progress Notes (Signed)
Location:  [place] Room Number: 105-P Place of Service:  SNF [phone])  Provider: Marlowe Sax FNP-C   PCP: Gifford Shave, MD Patient Care Team: Gifford Shave, MD as PCP - General [facility] Medicine)  Extended Emergency Contact Information Primary Emergency Contact: Robben,Sarah Address: [address]          [place],  [place] Home Phone: [phone] Mobile Phone: [phone] Relation: Sister Secondary Emergency Contact: [place] Mobile Phone: [phone] Relation: Brother  Code Status: Full Code  Goals of care:  Advanced Directive information Advanced Directives 11/18/2019  Does Patient Have a Medical Advance Directive? Yes  Type of Advance Directive -  Does patient want to make changes to medical advance directive? No - Patient declined  Would patient like information on creating a medical advance directive? -     No Known Allergies  Chief Complaint  Patient presents with   Discharge Note    Discharge from SNF    HPI:  64 y.o. female seen today at [facility] for discharge home.She has a medical history of Hypertension,Hyperlipidemia,depression among other conditions.she was here for short term rehabilitation for post hospital admission from 10/27/2019 - 11/06/2019 at [facility] for acute displaced right patellar fracture.She sustained fracture when she was cleaning her horses stall at home when something startled the horse which started moving erratically and frantically knocking her over from a standing position to the floor.the bar landed on her right knee.she also hit her left knee on the floor.the horse stepped on her right index finger.she did not hit her head.she was unable to walk due to severe pain so she called EMS and was taken to the ED.Prior to this she walked without any assistive device.   In the ED her WBC was 10.9,Hgb 11.6,Negative for COVID-19 and other labs were normal.X-ray  right hand showed acute fracture of the middle phalanx of the second right finger. Immobilized with a splint.X-ray of left knee showed non-displaced fracture of patellar.Right knee X-ray showed closed patella fracture.Orthopedic consulted.she underwent ORIF of the right patella.weight bearing as tolerated with pain management was recommended.She had Lovenox for DVT prophylaxis x 14 days.She was discharge to [facility] for rehabilitation. She had had unremarkable stay here at rehab.she completed her prophylaxis Lovenox  during rehabilitation 11/11/2019.Her latest Hgb trending up 10.0 > 10.6 she has worked well with PT/OT now stable for discharge home.She was seen by Orthopedic Dr.Dean on 11/12/2019 WBAT on RLE in brace only,keep right leg straight,WBAT to LLE,Buddy tape to right index finger okay for ROM but no lifting.Advised to follow up with Dr.Dean on 11/26/2019 at 10: 15 am.   She will be discharged home with Home health PT/OT to continue with ROM, Exercise, Gait stability and muscle strengthening.She will require DME 3-1 bedside commode. Patient unable to safely and independently perform toileting transfer in home with unsteady gait,Right knee fracture on immobilizer.She states has a walker at home.Her pain is under control was discharged on percocet but has not required it.Will discharge with Robaxin.   Home health services will be arranged by facility social worker prior to discharge. Recommended Prescription medication to be written x 1 month then patient to follow up with PCP in 1-2 weeks but has declined states had filled her medication for a 90 day supply prior to hospitalization.she denies any acute issues this visit. Facility staff report no new concerns.   Past Medical History:  Diagnosis Date   Depression    Heart  murmur    Hip fracture, right ([place]) 11/23/2010   Hypertension    Kidney stone    KNEE PAIN, LEFT 11/07/2009   Other and unspecified hyperlipidemia 01/01/2013    PERIMENOPAUSAL SYNDROME 11/07/2009   Rosacea 06/20/2006   Tension headache 06/20/2006        UTERINE FIBROID 06/20/2006    Past Surgical History:  Procedure Laterality Date   CATARACT EXTRACTION W/ INTRAOCULAR LENS IMPLANT Left 08/03/13   Dr. Katy Fitch @ [facility] Right    Cataract   ORIF PATELLA Right 10/29/2019   Procedure: OPEN REDUCTION INTERNAL (ORIF) FIXATION PATELLA;  Surgeon: Meredith Pel, MD;  Location: [facility];  Service: Orthopedics;  Laterality: Right;   r hip fracture and repair  January 24, 2010      reports that she has never smoked. She has never used smokeless tobacco. She reports current alcohol use of about 3.0 standard drinks of alcohol per week. She reports that she does not use drugs. Social History   Socioeconomic History   Marital status: Single    Spouse name: Not on file   Number of children: Not on file   Years of education: Not on file   Highest education level: Not on file  Occupational History   Occupation: travel agent    Employer: [employer]  Tobacco Use   Smoking status: Never Smoker   Smokeless tobacco: Never Used  Substance and Sexual Activity   Alcohol use: Yes    Alcohol/week: 3.0 standard drinks    Types: 1 Glasses of wine, 2 Cans of beer per week    Comment: few drinks a week   Drug use: No   Sexual activity: Yes    Birth control/protection: None  Other Topics Concern   Not on file  Social History Narrative   In relationship with female partner who lives in [place].  Works as travel Music therapist, job has become less stressful.     Social Determinants of Health   Financial Resource Strain:    Difficulty of Paying Living Expenses:   Food Insecurity:    Worried About Charity fundraiser in the Last Year:    Arboriculturist in the Last Year:   Transportation Needs:    Film/video editor (Medical):    Lack of Transportation (Non-Medical):   Physical Activity:    Days of Exercise per Week:    Minutes  of Exercise per Session:   Stress:    Feeling of Stress :   Social Connections:    Frequency of Communication with Friends and Family:    Frequency of Social Gatherings with Friends and Family:    Attends Religious Services:    Active Member of Clubs or Organizations:    Attends Archivist Meetings:    Marital Status:   Intimate Partner Violence:    Fear of Current or Ex-Partner:    Emotionally Abused:    Physically Abused:    Sexually Abused:     No Known Allergies  Pertinent  Health Maintenance Due  Topic Date Due   PAP SMEAR-Modifier  06/22/2014   MAMMOGRAM  03/13/2016   DEXA SCAN  Never done   PNA vac Low Risk Adult (1 of 2 - PCV13) Never done   INFLUENZA VACCINE  11/22/2019   COLONOSCOPY  03/11/2022    Medications: Outpatient Encounter Medications as of 11/18/2019  Medication Sig   alendronate (FOSAMAX) 70 MG tablet Take 70 mg by mouth once a  week. Take with a full glass of water on an empty stomach.   aspirin EC 81 MG tablet Take 81 mg by mouth daily. Swallow whole.   FLUoxetine (PROZAC) 20 MG capsule Take 1 capsule (20 mg total) by mouth daily.   lisinopril (PRINIVIL,ZESTRIL) 10 MG tablet TAKE 1 TABLET DAILY   NON FORMULARY DIET: REGULAR, NAS, HEART HEALTHY   rosuvastatin (CRESTOR) 20 MG tablet Take 20 mg by mouth daily.   [DISCONTINUED] methocarbamol (ROBAXIN) 500 MG tablet Take 1 tablet (500 mg total) by mouth every 6 (six) hours as needed for muscle spasms.   methocarbamol (ROBAXIN) 500 MG tablet Take 1 tablet (500 mg total) by mouth every 6 (six) hours as needed for muscle spasms.   [DISCONTINUED] enoxaparin (LOVENOX) 40 MG/0.4ML injection Inject 0.4 mLs (40 mg total) into the skin daily for 5 days.   No facility-administered encounter medications on file as of 11/18/2019.     Review of Systems  Constitutional: Negative for appetite change, chills, fatigue and fever.  HENT: Negative for congestion, rhinorrhea, sinus  pressure, sinus pain, sneezing, sore throat, tinnitus and trouble swallowing.   Eyes: Negative for pain, discharge, redness and itching.  Respiratory: Negative for cough, chest tightness, shortness of breath and wheezing.   Cardiovascular: Positive for leg swelling. Negative for chest pain and palpitations.  Gastrointestinal: Negative for abdominal distention, abdominal pain, constipation, diarrhea, nausea and vomiting.  Endocrine: Negative for cold intolerance, heat intolerance, polydipsia, polyphagia and polyuria.  Genitourinary: Negative for difficulty urinating, dysuria, flank pain, frequency and urgency.  Musculoskeletal: Positive for arthralgias and gait problem. Negative for joint swelling, myalgias and neck pain.  Skin: Negative for color change, pallor and rash.       Right knee surgical incision  Neurological: Negative for dizziness, speech difficulty, weakness, light-headedness, numbness and headaches.  Hematological: Does not bruise/bleed easily.  Psychiatric/Behavioral: Negative for agitation and sleep disturbance. The patient is not nervous/anxious.     Vitals:   11/18/19 0949  BP: 122/73  Pulse: 66  Resp: 17  Temp: 97.8 F (36.6 C)  Weight: 165 lb (74.8 kg)  Height: 5\' 6"  (1.676 m)   Body mass index is 26.63 kg/m. Physical Exam Vitals reviewed.  Constitutional:      General: She is not in acute distress.    Appearance: She is overweight.  HENT:     Head: Normocephalic.     Mouth/Throat:     Mouth: Mucous membranes are moist.     Pharynx: Oropharynx is clear. No oropharyngeal exudate or posterior oropharyngeal erythema.  Eyes:     General: No scleral icterus.       Right eye: No discharge.        Left eye: No discharge.     Conjunctiva/sclera: Conjunctivae normal.     Pupils: Pupils are equal, round, and reactive to light.  Neck:     Vascular: No carotid bruit.  Cardiovascular:     Rate and Rhythm: Normal rate and regular rhythm.     Pulses: Normal pulses.      Heart sounds: No friction rub. No gallop.   Pulmonary:     Effort: Pulmonary effort is normal. No respiratory distress.     Breath sounds: Normal breath sounds. No wheezing, rhonchi or rales.  Chest:     Chest wall: No tenderness.  Abdominal:     General: Bowel sounds are normal. There is no distension.     Palpations: Abdomen is soft. There is no mass.  Tenderness: There is no abdominal tenderness. There is no right CVA tenderness, left CVA tenderness, guarding or rebound.  Musculoskeletal:        General: No swelling or tenderness.     Cervical back: Normal range of motion. No rigidity or tenderness.     Left lower leg: No edema.     Comments: Unsteady gait RLE immobilizer brace in place.Right middle-index  finger buddy tape in place.   Lymphadenopathy:     Cervical: No cervical adenopathy.  Skin:    General: Skin is warm.     Coloration: Skin is not pale.     Findings: No bruising, erythema or rash.     Comments: Right knee surgical incision dry,clean and intact.surrounding skin tissue without any signs of infection.   Neurological:     Mental Status: She is alert and oriented to person, place, and time.     Cranial Nerves: No cranial nerve deficit.     Sensory: No sensory deficit.     Motor: No weakness.     Gait: Gait abnormal.  Psychiatric:        Mood and Affect: Mood normal.        Behavior: Behavior normal.        Thought Content: Thought content normal.        Judgment: Judgment normal.    Labs reviewed: Basic Metabolic Panel: Recent Labs    10/28/19 0423 10/29/19 0405 10/31/19 0458 10/31/19 0458 11/01/19 0301 11/01/19 0301 11/02/19 0301 11/10/19 0000 11/16/19 0000  NA 139   < > 141   < > 142   < > 140 141 141  K 4.1   < > 3.6   < > 3.6   < > 3.8 4.3 4.3  CL 104   < > 106   < > 106   < > 106 106 106  CO2 25   < > 25   < > 26   < > 25 21 21   GLUCOSE 108*   < > 121*  --  119*  --  110*  --   --   BUN 20   < > 11   < > 9   < > 22 22* 18  CREATININE  0.80   < > 0.80   < > 0.67   < > 0.71 0.5 0.6  CALCIUM 9.0   < > 8.5*   < > 8.8*   < > 8.8* 9.1 8.9  MG 2.3  --   --   --   --   --   --   --   --    < > = values in this interval not displayed.   CBC: Recent Labs    10/28/19 0423 10/29/19 0405 10/31/19 0458 10/31/19 0458 11/01/19 0301 11/01/19 0301 11/02/19 0301 11/10/19 0000 11/16/19 0000  WBC 6.7   < > 10.6*   < > 7.2   < > 6.5 6.4 5.4  NEUTROABS 4.6  --   --   --   --   --   --   --   --   HGB 11.2*   < > 10.2*   < > 10.4*   < > 10.1* 10.0* 10.6*  HCT 35.2*   < > 31.7*   < > 31.6*   < > 31.7* 30* 32*  MCV 97.8   < > 98.4  --  97.8  --  97.5  --   --   PLT 208   < >  212   < > 223   < > 240 320 306   < > = values in this interval not displayed.    Procedures and Imaging Studies During Stay: DG Knee 1-2 Views Right  Result Date: 10/29/2019 CLINICAL DATA:  ORIF right patella fracture. EXAM: RIGHT KNEE - 1-2 VIEW; DG C-ARM 1-60 MIN COMPARISON:  Radiograph 10/27/2019 FINDINGS: Two fluoroscopic spot views of the right knee obtained in the operating room. Patellar fracture. No hardware is visualized on the provided views. Total fluoroscopy time 8 seconds. Total dose 0.34 mGy. IMPRESSION: Intraoperative fluoroscopy during right knee patellar surgery. Electronically Signed   By: Keith Rake M.D.   On: 10/29/2019 18:46   CT KNEE LEFT WO CONTRAST  Result Date: 10/30/2019 CLINICAL DATA:  Known right knee fracture, query initially radiographically occult injury of the left knee. EXAM: CT OF THE left KNEE WITHOUT CONTRAST TECHNIQUE: Multidetector CT imaging of the left knee was performed according to the standard protocol. Multiplanar CT image reconstructions were also generated. COMPARISON:  Radiograph 10/27/2019 FINDINGS: Bones/Joint/Cartilage Subtle linear nondisplaced fractures of the left knee. One of these extends obliquely along the inferomedial pole of the left patella as shown on image 67/6. There also appears to be a subtle linear  vertically oriented fracture plane at the junction of the middle and lateral patellar thirds as shown on image 72/6. There is considerable degenerative articular cartilage thinning in the medial compartment and moderate degenerative chondral thinning in the lateral compartment causing joint space narrowing. No other fractures around the knee are identified. Small to moderate-sized hemarthrosis. Ligaments Suboptimally assessed by CT. Structures with expected contour for the ACL and PCL are observed. Muscles and Tendons Unremarkable Soft tissues Subcutaneous edema anterior to the patellar tendon and along the patellar retinacula, lateral greater than medial. IMPRESSION: 1. Subtle linear nondisplaced fractures of the left patella. One of these extends obliquely along the inferomedial pole of the left patella. There also appears to be a subtle linear vertically oriented fracture plane at the junction of the middle and lateral patellar thirds. 2. Small to moderate-sized hemarthrosis. 3. Subcutaneous edema anterior to the patellar tendon and along the patellar retinacula, lateral greater than medial. 4. Considerable degenerative articular cartilage thinning in the medial compartment and moderate degenerative chondral thinning in the lateral compartment. Electronically Signed   By: Van Clines M.D.   On: 10/30/2019 06:11   DG Knee Complete 4 Views Left  Result Date: 10/27/2019 CLINICAL DATA:  Status post trauma. EXAM: LEFT KNEE - COMPLETE 4+ VIEW COMPARISON:  None. FINDINGS: No evidence of acute fracture or dislocation. No evidence of arthropathy or other focal bone abnormality. There is a small joint effusion. IMPRESSION: No acute fracture or dislocation. Small joint effusion. Electronically Signed   By: Virgina Norfolk M.D.   On: 10/27/2019 19:31   DG Knee Complete 4 Views Right  Result Date: 10/27/2019 CLINICAL DATA:  64 year old female with fall bilateral knee pain. EXAM: RIGHT KNEE - COMPLETE 4+ VIEW  COMPARISON:  Left knee radiograph dated 10/27/2019. FINDINGS: There is a comminuted fracture of the patella with 3 main displaced fracture fragments. There is no dislocation. The bones are osteopenic. There is a moderate joint effusion. The soft tissue swelling of the anterior knee. No radiopaque foreign object or soft tissue gas. IMPRESSION: 1. Displaced patellar fractures. No dislocation. 2. Moderate joint effusion. Electronically Signed   By: Anner Crete M.D.   On: 10/27/2019 19:30   DG Knee Right Port  Result Date: 10/29/2019  CLINICAL DATA:  Postop right patellar fracture. EXAM: PORTABLE RIGHT KNEE - 1-2 VIEW COMPARISON:  Preoperative radiograph 09/28/2019 FINDINGS: Debridement of the lower patellar pole. Few comminuted fragments inferiorly. Recent postsurgical change includes air and edema in the joint space and soft tissues. Tibiofemoral alignment is maintained. IMPRESSION: Postsurgical fixation of patellar fracture. Electronically Signed   By: Keith Rake M.D.   On: 10/29/2019 18:48   DG Hand Complete Right  Result Date: 10/27/2019 CLINICAL DATA:  Status post trauma. EXAM: RIGHT HAND - COMPLETE 3+ VIEW COMPARISON:  None. FINDINGS: Acute fracture is seen involving the middle phalanx of the second right finger. There is no evidence of dislocation. A radiopaque ring (i.e. Jewelry) is seen overlying the proximal phalanx of the fourth right finger. A radiopaque pulse on motor is seen overlying the distal phalanx of the fourth right finger. There is no evidence of arthropathy or other focal bone abnormality. Soft tissues are unremarkable. IMPRESSION: Acute fracture of the middle phalanx of the second right finger. Electronically Signed   By: Virgina Norfolk M.D.   On: 10/27/2019 19:29   DG C-Arm 1-60 Min  Result Date: 10/29/2019 CLINICAL DATA:  ORIF right patella fracture. EXAM: RIGHT KNEE - 1-2 VIEW; DG C-ARM 1-60 MIN COMPARISON:  Radiograph 10/27/2019 FINDINGS: Two fluoroscopic spot views of  the right knee obtained in the operating room. Patellar fracture. No hardware is visualized on the provided views. Total fluoroscopy time 8 seconds. Total dose 0.34 mGy. IMPRESSION: Intraoperative fluoroscopy during right knee patellar surgery. Electronically Signed   By: Keith Rake M.D.   On: 10/29/2019 18:46   XR Knee 1-2 Views Left  Result Date: 11/14/2019 AP lateral left knee reviewed.  Patella fracture inferior pole is difficult to visualize.  No displacement is present.  No new fracture in the femur or tibia.  Alignment intact.  Patella height normal.  XR Finger Index Right  Result Date: 11/14/2019 AP lateral right index finger reviewed.  Middle phalanx fracture in reasonable position and alignment with only 10 degrees of apex dorsal angulation present.  No change in alignment compared to radiographs from 2 weeks ago.  XR Knee 1-2 Views Right  Result Date: 11/14/2019 AP lateral right knee reviewed.  Inferior pole patella fracture has been realigned.  There is some displacement of the major fragments relative to immediate postop appearance.  Patella height relative to the femur remains the same and radiographs today compared to immediate postop radiographs.  No new fracture.  Assessment/Plan:    1. Unsteady gait Has worked well with PT/ OT. She will discharge home PT/OT to continue with ROM, Exercise, Gait stability and muscle strengthening.Will require DME 3-1 bedside commode. Patient unable to safely and independently perform toileting transfer in home with unsteady gait,Right knee fracture on immobilizer. Fall and safety precautions.   2. Closed sleeve fracture of right patella with routine healing, subsequent encounter Status post hospitalization post fall knocked from standing position by her horse.Right knee surgical incision dry,clean and intact.surrounding skin tissue without any signs of infection. - completed levonox prophylaxis   - WBAT - Has not required her percocet  during rehab.continue on Robaxin as below  - methocarbamol (ROBAXIN) 500 MG tablet; Take 1 tablet (500 mg total) by mouth every 6 (six) hours as needed for muscle spasms.  Dispense: 30 tablet; Refill: 0 - follow up with Orthopedic specialist Dr.Dean 11/26/2019 at 10: 15 am as directed. - Fall and safety precaution advised.  - Hgb trending up. - CBC/diff in 1-2 weeks  with PCP   3. Closed sleeve fracture of left patella with routine healing, subsequent encounter Tender to deep palpation only.  - WBAT on LLE per Orthopedic - methocarbamol (ROBAXIN) 500 MG tablet; Take 1 tablet (500 mg total) by mouth every 6 (six) hours as needed for muscle spasms.  Dispense: 30 tablet; Refill: 0 - follow up with Dr.Dean 11/26/2019 at 10:15 am   4. Closed nondisplaced fracture of distal phalanx of right middle finger with routine healing, subsequent encounter - Buddy taper as ordered by Orthopedic.  - No lifting  - methocarbamol (ROBAXIN) 500 MG tablet; Take 1 tablet (500 mg total) by mouth every 6 (six) hours as needed for muscle spasms.  Dispense: 30 tablet; Refill: 0 - follow up with Dr.Dean 11/26/2019 at 10:15 am   5. Essential hypertension B/p stable. - continue on lisinopril 20 mg tablet daily  - CBC, BMP in 1-2 weeks PCP   6. Hyperlipidemia, unspecified hyperlipidemia type No LDL on chart for review.will defer to PCP - continue on Rosuvastatin 20 mg tablet daily   7. Depression, major, single episode, in partial remission (HCC) Mood stable. - continue on Fluoxetine 20 mg capsule daily.   Patient is being discharged with the following home health services:   -PT/OT for ROM, exercise, gait stability and muscle strengthening  Patient is being discharged with the following durable medical equipment:    3-1 bedside commode. Patient unable to safely and independently perform toileting transfer in home with unsteady gait,Right knee fracture on immobilizer.  Patient has been advised to f/u with their PCP  in 1-2 weeks to for a transitions of care visit.Social services at their facility was responsible for arranging this appointment. Pt declined for her medication to be refilled x 1 month states recently filled up all her medication for 90 days prior to her hospitalization 10/27/2019. Robaxin script send to pharmacy.Not on any controlled medication.    Future labs/tests needed:  CBC, BMP in 1-2 weeks PCP

## 2019-11-26 ENCOUNTER — Ambulatory Visit: Payer: Self-pay

## 2019-11-26 ENCOUNTER — Ambulatory Visit (INDEPENDENT_AMBULATORY_CARE_PROVIDER_SITE_OTHER): Payer: Medicare Other

## 2019-11-26 ENCOUNTER — Ambulatory Visit (INDEPENDENT_AMBULATORY_CARE_PROVIDER_SITE_OTHER): Payer: Medicare Other | Admitting: Orthopedic Surgery

## 2019-11-26 DIAGNOSIS — S82092D Other fracture of left patella, subsequent encounter for closed fracture with routine healing: Secondary | ICD-10-CM

## 2019-11-26 DIAGNOSIS — S82092A Other fracture of left patella, initial encounter for closed fracture: Secondary | ICD-10-CM

## 2019-11-26 DIAGNOSIS — S82091D Other fracture of right patella, subsequent encounter for closed fracture with routine healing: Secondary | ICD-10-CM

## 2019-11-26 DIAGNOSIS — S62650A Nondisplaced fracture of medial phalanx of right index finger, initial encounter for closed fracture: Secondary | ICD-10-CM

## 2019-11-27 ENCOUNTER — Encounter: Payer: Self-pay | Admitting: Orthopedic Surgery

## 2019-11-27 NOTE — Progress Notes (Signed)
Post-Op Visit Note   Patient: April Castillo           Date of Birth: 06/15/1955           MRN: [id number] Visit Date: 11/26/2019 PCP: Gifford Shave, MD   Assessment & Plan:  Chief Complaint:  Chief Complaint  Patient presents with  . Right Knee - Follow-up  . Left Knee - Follow-up  . Finger Injury   Visit Diagnoses:  1. Closed sleeve fracture of right patella with routine healing, subsequent encounter   2. Closed nondisplaced fracture of middle phalanx of right index finger, initial encounter   3. Closed patellar sleeve fracture, left, initial encounter     Plan: April Castillo is a 64 year old patient who underwent right patella open reduction internal fixation 10/29/2019.  She also had nondisplaced left patella fracture and index finger fracture of the middle phalanx.  She has been buddy taping the index to middle finger and working on range of motion.  She has been weightbearing as tolerated in a Bledsoe brace on the right-hand side.  Left knee has been weightbearing as tolerated with no restriction of motion.  On examination she is able to do straight leg raises with the right-hand side.  Radiographs show no change in fracture alignment on the right.  Left sided fracture alignment also unchanged and she has full range of motion with no effusion on the left.  Plan at this time is to change her Bledsoe brace to 30 degrees of motion.  Okay to start range of motion 0-30 and to continue with straight leg raising as well as patella mobilization.  I will see her back in 2 weeks just to recheck the x-rays and likely increase her blood so brace 60 degrees at that point.  Op note and copy of the x-rays provided so that she can take those to her therapist.  Regarding the right index finger she does have some predictable stiffness and we talked about hand therapy for that.  For now she is in a work on range of motion on her own as I think there is no fracture stability at this time to do that.  We can  recheck that and decide for or against occupational therapy for the hand at the next clinic visit.  Follow-Up Instructions: Return in about 2 weeks (around 12/10/2019).   Orders:  Orders Placed This Encounter  Procedures  . XR Knee 1-2 Views Right  . XR Knee 1-2 Views Left  . XR Finger Index Right   No orders of the defined types were placed in this encounter.   Imaging: XR Knee 1-2 Views Left  Result Date: 11/27/2019 AP lateral radiographs left knee reviewed.  Inferior pole patella fracture is barely visible.  Fracture lines have begun consolidation.  No displacement.  No other new fractures or malalignment in the left knee.  XR Finger Index Right  Result Date: 11/27/2019 AP lateral index finger reviewed.  Middle phalanx fracture is again visualized with about 10 degrees of apex dorsal angulation.  Joint itself is maintained with no fracture displacement within the PIP joint.  XR Knee 1-2 Views Right  Result Date: 11/27/2019 AP lateral right knee reviewed.  Patella fracture is unchanged compared to radiographs from 2 weeks ago.  There is been no further displacement of the inferior fragments from the main body of the patella.  No new fracture.  Joint spaces maintained in the medial and lateral compartment.   PMFS History: Patient Active Problem List  Diagnosis Date Noted  . Closed patellar sleeve fracture, left, initial encounter 11/10/2019  . Closed patellar sleeve fracture of right knee 10/28/2019  . Finger fracture, right 10/28/2019  . Conjunctivitis 11/07/2017  . Hyperlipidemia 01/01/2013  . Allergic rhinitis 12/12/2011  . Breast mass, right 11/23/2010  . WEIGHT GAIN 11/07/2009  . Depression, major, single episode, in partial remission ([place]) 09/26/2006  . Essential hypertension 09/26/2006   Past Medical History:  Diagnosis Date  . Depression   . Heart murmur   . Hip fracture, right ([place]) 11/23/2010  . Hypertension   . Kidney stone   . KNEE PAIN, LEFT 11/07/2009  .  Other and unspecified hyperlipidemia 01/01/2013  . PERIMENOPAUSAL SYNDROME 11/07/2009  . Rosacea 06/20/2006  . Tension headache 06/20/2006       . UTERINE FIBROID 06/20/2006    Family History  Problem Relation Age of Onset  . Cancer Father   . Hypertension Father   . Cancer Sister   . Thyroid disease Sister   . Breast cancer Sister   . Cancer Maternal Uncle   . Diabetes Neg Hx   . Heart disease Neg Hx     Past Surgical History:  Procedure Laterality Date  . CATARACT EXTRACTION W/ INTRAOCULAR LENS IMPLANT Left 08/03/13   Dr. Katy Fitch @ [facility] [place]  . EYE SURGERY Right    Cataract  . ORIF PATELLA Right 10/29/2019   Procedure: OPEN REDUCTION INTERNAL (ORIF) FIXATION PATELLA;  Surgeon: Meredith Pel, MD;  Location: [place];  Service: Orthopedics;  Laterality: Right;  . r hip fracture and repair  January 24, 2010   Social History   Occupational History  . 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 Smoking status: Never Smoker  . Smokeless tobacco: Never Used  Substance and Sexual Activity  . Alcohol use: Yes    Alcohol/week: 3.0 standard drinks    Types: 1 Glasses of wine, 2 Cans of beer per week    Comment: few drinks a week  . Drug use: No  . Sexual activity: Yes    Birth control/protection: None

## 2019-12-10 ENCOUNTER — Ambulatory Visit: Payer: Self-pay

## 2019-12-10 ENCOUNTER — Ambulatory Visit (INDEPENDENT_AMBULATORY_CARE_PROVIDER_SITE_OTHER): Payer: Medicare Other | Admitting: Orthopedic Surgery

## 2019-12-10 ENCOUNTER — Encounter: Payer: Self-pay | Admitting: Orthopedic Surgery

## 2019-12-10 ENCOUNTER — Ambulatory Visit (INDEPENDENT_AMBULATORY_CARE_PROVIDER_SITE_OTHER): Payer: Medicare Other

## 2019-12-10 VITALS — Ht 66.0 in | Wt 165.0 lb

## 2019-12-10 DIAGNOSIS — S62650A Nondisplaced fracture of medial phalanx of right index finger, initial encounter for closed fracture: Secondary | ICD-10-CM

## 2019-12-10 DIAGNOSIS — S82091D Other fracture of right patella, subsequent encounter for closed fracture with routine healing: Secondary | ICD-10-CM

## 2019-12-10 DIAGNOSIS — S82092A Other fracture of left patella, initial encounter for closed fracture: Secondary | ICD-10-CM

## 2019-12-12 ENCOUNTER — Encounter: Payer: Self-pay | Admitting: Orthopedic Surgery

## 2019-12-12 NOTE — Progress Notes (Signed)
Post-Op Visit Note   Patient: April Castillo           Date of Birth: October 13, 1955           MRN: [id number] Visit Date: 12/10/2019 PCP: Gifford Shave, MD   Assessment & Plan:  Chief Complaint:  Chief Complaint  Patient presents with  . Left Knee - Fracture, Follow-up  . Right Knee - Follow-up    10/29/2019 Right patella ORIF  . Right Index Finger - Fracture, Follow-up   Visit Diagnoses:  1. Closed sleeve fracture of right patella with routine healing, subsequent encounter   2. Closed nondisplaced fracture of middle phalanx of right index finger, initial encounter   3. Closed patellar sleeve fracture, left, initial encounter     Plan: Patient is a 64 year old female presents s/p right patella ORIF on 10/29/2019.  She is also being followed for nondisplaced left patella fracture and right index phalanx fracture.  She notes that she continues to progress and her left knee is causing her some soreness but overall she feels it is normal.  The soreness only bothers her when she gets up from a seated position and then goes away when she is walking.  She notes her right index finger has continued to remain swollen and she has had continued stiffness with flexion but this stiffness is steadily improving.  Stiffness does not bother her to the point where she thinks she needs therapy and she will continue to work on it around by doing range of motion exercises.  For the right knee, she has continued using Bledsoe brace which is set from 0 degrees of extension to 30 degrees of flexion.  She notes no significant pain and she is able to ambulate well.  She has no pain when ambulating.  She is not taking any medications for pain.  She has excellent extension with 0 degrees of extension of the right knee and her right knee flexes to about 45 degrees easily.  Recommended patient continue to work on the range of motion of her right knee and her Bledsoe brace was increased to 60 degrees flexion today.   Instructed patient to set the brace to 75 degrees in 1 week and then 90 degrees 2 weeks from today.  She will start outpatient physical therapy to work on passive and active right knee range of motion.  No flexion past 90 degrees.  Follow-up in 2 weeks.  Follow-Up Instructions: No follow-ups on file.   Orders:  Orders Placed This Encounter  Procedures  . XR Knee 1-2 Views Right  . XR Knee 1-2 Views Left  . XR Finger Index Right  . Ambulatory referral to Physical Therapy   No orders of the defined types were placed in this encounter.   Imaging: No results found.  PMFS History: Patient Active Problem List   Diagnosis Date Noted  . Closed patellar sleeve fracture, left, initial encounter 11/10/2019  . Closed patellar sleeve fracture of right knee 10/28/2019  . Finger fracture, right 10/28/2019  . Conjunctivitis 11/07/2017  . Hyperlipidemia 01/01/2013  . Allergic rhinitis 12/12/2011  . Breast mass, right 11/23/2010  . WEIGHT GAIN 11/07/2009  . Depression, major, single episode, in partial remission ([place]) 09/26/2006  . Essential hypertension 09/26/2006   Past Medical History:  Diagnosis Date  . Depression   . Heart murmur   . Hip fracture, right ([place]) 11/23/2010  . Hypertension   . Kidney stone   . KNEE PAIN, LEFT 11/07/2009  . Other  and unspecified hyperlipidemia 01/01/2013  . PERIMENOPAUSAL SYNDROME 11/07/2009  . Rosacea 06/20/2006  . Tension headache 06/20/2006       . UTERINE FIBROID 06/20/2006    Family History  Problem Relation Age of Onset  . Cancer Father   . Hypertension Father   . Cancer Sister   . Thyroid disease Sister   . Breast cancer Sister   . Cancer Maternal Uncle   . Diabetes Neg Hx   . Heart disease Neg Hx     Past Surgical History:  Procedure Laterality Date  . CATARACT EXTRACTION W/ INTRAOCULAR LENS IMPLANT Left 08/03/13   Dr. Katy Fitch @ [facility] [place]  . EYE SURGERY Right    Cataract  . ORIF PATELLA Right 10/29/2019   Procedure: OPEN REDUCTION INTERNAL  (ORIF) FIXATION PATELLA;  Surgeon: Meredith Pel, MD;  Location: [place];  Service: Orthopedics;  Laterality: Right;  . r hip fracture and repair  January 24, 2010   Social History   Occupational History  . 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 Smoking status: Never Smoker  . Smokeless tobacco: Never Used  Substance and Sexual Activity  . Alcohol use: Yes    Alcohol/week: 3.0 standard drinks    Types: 1 Glasses of wine, 2 Cans of beer per week    Comment: few drinks a week  . Drug use: No  . Sexual activity: Yes    Birth control/protection: None

## 2019-12-22 ENCOUNTER — Encounter: Payer: Self-pay | Admitting: Physical Therapy

## 2019-12-22 ENCOUNTER — Ambulatory Visit: Payer: Medicare Other | Admitting: Physical Therapy

## 2019-12-22 ENCOUNTER — Other Ambulatory Visit: Payer: Self-pay

## 2019-12-22 DIAGNOSIS — M6281 Muscle weakness (generalized): Secondary | ICD-10-CM

## 2019-12-22 DIAGNOSIS — M25562 Pain in left knee: Secondary | ICD-10-CM

## 2019-12-22 DIAGNOSIS — M79644 Pain in right finger(s): Secondary | ICD-10-CM

## 2019-12-22 DIAGNOSIS — M25661 Stiffness of right knee, not elsewhere classified: Secondary | ICD-10-CM

## 2019-12-22 DIAGNOSIS — M25561 Pain in right knee: Secondary | ICD-10-CM | POA: Diagnosis not present

## 2019-12-22 DIAGNOSIS — R600 Localized edema: Secondary | ICD-10-CM

## 2019-12-22 DIAGNOSIS — R2689 Other abnormalities of gait and mobility: Secondary | ICD-10-CM

## 2019-12-22 NOTE — Patient Instructions (Signed)
Access Code: [email or web address] URL: [email or web address] Date: 12/22/2019 Prepared by: Elsie Ra  Exercises Seated Knee Flexion AAROM - 2 x daily - 6 x weekly - 3 sets - 10 reps Seated Hamstring Stretch - 2 x daily - 6 x weekly - 3 reps - 1 sets - 30 hold Seated Long Arc Quad - 2 x daily - 6 x weekly - 10 reps - 2-3 sets Supine Quad Set - 2 x daily - 6 x weekly - 10 reps - 2 sets - 5 hold Supine Active Straight Leg Raise - 2 x daily - 6 x weekly - 2 sets - 10 reps Mini Squat with Counter Support - 2 x daily - 6 x weekly - 2-3 sets - 5-10 reps

## 2019-12-22 NOTE — Therapy (Signed)
[facility] Physical Therapy [address] [place], [place], [address] Phone: [phone]   Fax:  [phone]  Physical Therapy Evaluation  Patient Details  Name: April Castillo MRN: [id number] Date of Birth: 08-08-1955 Referring Provider (PT): Marlou Sa Tonna Corner, MD   Encounter Date: 12/22/2019   PT End of Session - 12/22/19 1009    Visit Number 1    Number of Visits 24    Date for PT Re-Evaluation 03/15/20    PT Start Time 0845    PT Stop Time 0930    PT Time Calculation (min) 45 min    Activity Tolerance Patient tolerated treatment well    Behavior During Therapy [facility] for tasks assessed/performed           Past Medical History:  Diagnosis Date  . Depression   . Heart murmur   . Hip fracture, right ([place]) 11/23/2010  . Hypertension   . Kidney stone   . KNEE PAIN, LEFT 11/07/2009  . Other and unspecified hyperlipidemia 01/01/2013  . PERIMENOPAUSAL SYNDROME 11/07/2009  . Rosacea 06/20/2006  . Tension headache 06/20/2006       . UTERINE FIBROID 06/20/2006    Past Surgical History:  Procedure Laterality Date  . CATARACT EXTRACTION W/ INTRAOCULAR LENS IMPLANT Left 08/03/13   Dr. Katy Fitch @ [facility] [place]  . EYE SURGERY Right    Cataract  . ORIF PATELLA Right 10/29/2019   Procedure: OPEN REDUCTION INTERNAL (ORIF) FIXATION PATELLA;  Surgeon: Meredith Pel, MD;  Location: [place];  Service: Orthopedics;  Laterality: Right;  . r hip fracture and repair  January 24, 2010    There were no vitals filed for this visit.    Subjective Assessment - 12/22/19 0850    Subjective She relays a horse got spooked and broke free causing the cross ties from fence to collapse down on her causing her to fall. She had bilat patella fx and Rt finger fx. She had Rt patella ORIF 10/29/19. She has been in hinge knee brace since surgery and now is up to 80 deg of flexion in brace with instructions to slowly progress to 90 but not to flex past 90 deg at this time    Pertinent History Rt  hip ORIF 9 years ago    Limitations Standing;Walking;House hold activities    Patient Stated Goals improve ROM and strength in her knees    Currently in Pain? Yes    Pain Score 1     Pain Location Knee    Pain Orientation Right;Left    Pain Descriptors / Indicators Tightness   twinges   Pain Type Surgical pain    Pain Radiating Towards dneies N/T    Pain Onset More than a month ago    Pain Frequency Intermittent    Aggravating Factors  sometimes standing activity, bending her knee    Pain Relieving Factors rest,    Multiple Pain Sites No              [facility] PT Assessment - 12/22/19 0001      Assessment   Medical Diagnosis Closed sleeve fracture of right and left patella with routine healing, subsequent encounter with Rt patella ORIF 10/29/19. Rt finger fracture    Referring Provider (PT) Marlou Sa Tonna Corner, MD    Onset Date/Surgical Date 10/29/19    Next MD Visit 12/25/19      Restrictions   Other Position/Activity Restrictions No flexing past 90 degrees, currently in hinge knee brace  Balance Screen   Has the patient fallen in the past 6 months Yes    How many times? 1   this accident, no others   Has the patient had a decrease in activity level because of a fear of falling?  Yes    Is the patient reluctant to leave their home because of a fear of falling?  No      Home Ecologist residence    Additional Comments has 6 steps to enter, currently going up/down sideways      Prior Function   Level of Independence Independent    Vocation Retired    Leisure horses, gardening      Cognition   Overall Cognitive Status Within Functional Limits for tasks assessed      Observation/Other Assessments-Edema    Edema --   moderate edema in Rt knee     ROM / Strength   AROM / PROM / Strength AROM;Strength;PROM      AROM   AROM Assessment Site Knee    Right/Left Knee Right;Left    Right Knee Extension -2    Right Knee Flexion 75    Left Knee  Extension 0    Left Knee Flexion 125      PROM   PROM Assessment Site Knee    Right/Left Knee Right    Right Knee Extension -1    Right Knee Flexion 80      Strength   Strength Assessment Site Hip;Knee    Right/Left Hip Right;Left    Right Hip Flexion 3+/5    Right Hip ABduction 4/5    Left Hip Flexion 5/5    Left Hip ABduction 5/5    Right/Left Knee Right;Left    Right Knee Flexion 3+/5    Right Knee Extension 3+/5    Left Knee Flexion 4+/5    Left Knee Extension 4+/5      Flexibility   Soft Tissue Assessment /Muscle Length --   mild tight H.S, severe quad tightness     Palpation   Patella mobility hypomobility      Transfers   Transfers Independent with all Transfers      Ambulation/Gait   Ambulation/Gait Yes    Ambulation/Gait Assistance 6: Modified independent (Device/Increase time)    Gait Comments ambulates community distances but with hinged knee brace on with decreased hip/knee flexion during swing phase                      Objective measurements completed on examination: See above findings.       [place] Adult PT Treatment/Exercise - 12/22/19 0001      Exercises   Exercises Knee/Hip      Knee/Hip Exercises: Stretches   Knee: Self-Stretch Limitations tailgate AAROM heelslides 10 sec X 10 reps      Knee/Hip Exercises: Aerobic   Nustep 7 min L3 for gentle ROM      Knee/Hip Exercises: Standing   Functional Squat Limitations mini squats with UE support and in brace 0-80 deg X 10 reps      Knee/Hip Exercises: Seated   Long Arc Quad Right;10 reps                  PT Education - 12/22/19 1009    Education Details HEP ,POC    Person(s) Educated Patient    Methods Explanation;Demonstration;Verbal cues;Handout    Comprehension Verbalized understanding;Returned demonstration;Need further instruction  PT Short Term Goals - 12/22/19 1016      PT SHORT TERM GOAL #1   Title Pt will be I and compliant with inital HEP     Time 4    Period Weeks    Status New      PT SHORT TERM GOAL #2   Title Pt will improve Rt knee flexion ROM to 90 deg    Time 4    Period Weeks    Status New             PT Long Term Goals - 12/22/19 1015      PT LONG TERM GOAL #1   Title Pt will be I and compliant with final HEP    Time 12    Period Weeks    Status New    Target Date 03/15/20      PT LONG TERM GOAL #2   Title Pt will improve Rt knee AROM 0-120 deg to improve function.    Time 12    Period Weeks    Status New      PT LONG TERM GOAL #3   Title Pt will improve bilat hip strength to 4+ and bilat knee strength to 5 MMT    Time 12    Period Weeks    Status New      PT LONG TERM GOAL #4   Title Pt will be able to ambulate community distances 1000 ft on even and uneven surfaces and negotiate stairs with less than 3/10 pain or difficulty.    Time 12    Period Weeks    Status New                  Plan - 12/22/19 1010    Clinical Impression Statement Pt presents with Closed sleeve fracture of right and left patella with routine healing, and S/P Rt patella ORIF 10/29/19. Of note she also has Rt finger Fx. She is currently in knee hinge brace 0-80 deg with restrictions of no flexing knee past 90 deg. She will benefit from skilled PT to address her functional impairments in knee ROM, LE weakness, gait, and activity tolerance.    Examination-Activity Limitations Bend;Carry;Lift;Stand;Stairs;Squat;Locomotion Level;Transfers    Examination-Participation Restrictions Cleaning;Driving;Yard Work;Shop    Stability/Clinical Decision Making Evolving/Moderate complexity    Clinical Decision Making Moderate    Rehab Potential Good    PT Frequency 2x / week    PT Duration 12 weeks    PT Treatment/Interventions ADLs/Self Care Home Management;Cryotherapy;Electrical Stimulation;Iontophoresis 4mg /ml Dexamethasone;Moist Heat;Ultrasound;Gait training;Stair training;Therapeutic activities;Therapeutic exercise;Neuromuscular  re-education;Balance training;Manual techniques;Passive range of motion;Dry needling;Joint Manipulations;Vasopneumatic Device;Taping    PT Next Visit Plan review and update HEP PRN. needs Rt knee ROM and strenght as biggest focus. She saw MD 9/3 so anything new from them? Still no loading past 90?    PT Home Exercise Plan Access Code: [email or web address]           Patient will benefit from skilled therapeutic intervention in order to improve the following deficits and impairments:  Abnormal gait, Decreased activity tolerance, Decreased endurance, Decreased mobility, Decreased strength, Decreased range of motion, Difficulty walking, Hypomobility, Increased edema, Impaired flexibility, Increased muscle spasms, Increased fascial restricitons, Impaired UE functional use, Pain  Visit Diagnosis: Stiffness of right knee, not elsewhere classified  Acute pain of right knee  Acute pain of left knee  Muscle weakness (generalized)  Other abnormalities of gait and mobility  Localized edema  Pain in right finger(s)  Problem List Patient Active Problem List   Diagnosis Date Noted  . Closed patellar sleeve fracture, left, initial encounter 11/10/2019  . Closed patellar sleeve fracture of right knee 10/28/2019  . Finger fracture, right 10/28/2019  . Conjunctivitis 11/07/2017  . Hyperlipidemia 01/01/2013  . Allergic rhinitis 12/12/2011  . Breast mass, right 11/23/2010  . WEIGHT GAIN 11/07/2009  . Depression, major, single episode, in partial remission ([place]) 09/26/2006  . Essential hypertension 09/26/2006    April Castillo 12/22/2019, 10:27 AM  [facility] Physical Therapy [address] [place], [place], [address] Phone: [phone]   Fax:  [phone]  Name: April Castillo MRN: [id number] Date of Birth: [birth year]

## 2019-12-25 ENCOUNTER — Ambulatory Visit (INDEPENDENT_AMBULATORY_CARE_PROVIDER_SITE_OTHER): Payer: Medicare Other | Admitting: Surgical

## 2019-12-25 ENCOUNTER — Encounter: Payer: Self-pay | Admitting: Surgical

## 2019-12-25 VITALS — Ht 66.0 in | Wt 165.0 lb

## 2019-12-25 DIAGNOSIS — S82091D Other fracture of right patella, subsequent encounter for closed fracture with routine healing: Secondary | ICD-10-CM

## 2019-12-25 NOTE — Progress Notes (Signed)
Post-Op Visit Note   Patient: April Castillo           Date of Birth: [birth year]           MRN: [id number] Visit Date: 12/25/2019 PCP: Gifford Shave, MD   Assessment & Plan:  Chief Complaint:  Chief Complaint  Patient presents with  . Right Index Finger - Follow-up, Fracture  . Left Knee - Fracture, Follow-up  . Right Knee - Follow-up    10/29/2019 right patella fracture   Visit Diagnoses:  1. Closed sleeve fracture of right patella with routine healing, subsequent encounter     Plan: Patient is a 64 year old female presents s/p right patella ORIF on 10/29/2019.  She increased her Bledsoe brace to 80 degrees last week and notes that she is doing well with pain control.  At her last office visit her brace was set to 60 degrees.  She has 0 degrees of extension and 80 degrees of passive flexion today.  Incisions healing well with no evidence of dehiscence or infection.  She is able to easily perform multiple straight leg raises with a small 5 degree extensor lag.  No calf tenderness.  Negative Homans' sign.  Left knee is doing well with minor soreness only when going from a sitting position to a standing position.  She has been to physical therapy for 1 session.  Plan to continue in physical therapy and the Bledsoe brace was set to 90 degrees today.  She will discontinue the brace in 1 week when she is 2 months out from surgery.  Recommended she continue with the brace if her right knee feels unstable.  She is not taking any medication for pain.  She does note that her right index finger has continued with persistent swelling.  Recommended she discuss possible exercises with her physical therapist.  If they are not able to work on this with her, consider referring her to the hand center for occupational therapy.  Patient understands and agrees with plan.  Follow-up in 3 weeks for clinical recheck.  Follow-Up Instructions: No follow-ups on file.   Orders:  No orders of the defined types were  placed in this encounter.  No orders of the defined types were placed in this encounter.   Imaging: No results found.  PMFS History: Patient Active Problem List   Diagnosis Date Noted  . Closed patellar sleeve fracture, left, initial encounter 11/10/2019  . Closed patellar sleeve fracture of right knee 10/28/2019  . Finger fracture, right 10/28/2019  . Conjunctivitis 11/07/2017  . Hyperlipidemia 01/01/2013  . Allergic rhinitis 12/12/2011  . Breast mass, right 11/23/2010  . WEIGHT GAIN 11/07/2009  . Depression, major, single episode, in partial remission ([place]) 09/26/2006  . Essential hypertension 09/26/2006   Past Medical History:  Diagnosis Date  . Depression   . Heart murmur   . Hip fracture, right ([place]) 11/23/2010  . Hypertension   . Kidney stone   . KNEE PAIN, LEFT 11/07/2009  . Other and unspecified hyperlipidemia 01/01/2013  . PERIMENOPAUSAL SYNDROME 11/07/2009  . Rosacea 06/20/2006  . Tension headache 06/20/2006       . UTERINE FIBROID 06/20/2006    Family History  Problem Relation Age of Onset  . Cancer Father   . Hypertension Father   . Cancer Sister   . Thyroid disease Sister   . Breast cancer Sister   . Cancer Maternal Uncle   . Diabetes Neg Hx   . Heart disease Neg Hx  Past Surgical History:  Procedure Laterality Date  . CATARACT EXTRACTION W/ INTRAOCULAR LENS IMPLANT Left 08/03/13   Dr. Katy Fitch @ [facility] [place]  . EYE SURGERY Right    Cataract  . ORIF PATELLA Right 10/29/2019   Procedure: OPEN REDUCTION INTERNAL (ORIF) FIXATION PATELLA;  Surgeon: Meredith Pel, MD;  Location: [place];  Service: Orthopedics;  Laterality: Right;  . r hip fracture and repair  January 24, 2010   Social History   Occupational History  . 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 Smoking status: Never Smoker  . Smokeless tobacco: Never Used  Substance and Sexual Activity  . Alcohol use: Yes    Alcohol/week: 3.0 standard drinks    Types: 1 Glasses of  wine, 2 Cans of beer per week    Comment: few drinks a week  . Drug use: No  . Sexual activity: Yes    Birth control/protection: None

## 2020-01-07 ENCOUNTER — Ambulatory Visit: Payer: Medicare Other | Admitting: Rehabilitative and Restorative Service Providers"

## 2020-01-07 ENCOUNTER — Encounter: Payer: Self-pay | Admitting: Rehabilitative and Restorative Service Providers"

## 2020-01-07 ENCOUNTER — Other Ambulatory Visit: Payer: Self-pay

## 2020-01-07 DIAGNOSIS — M25661 Stiffness of right knee, not elsewhere classified: Secondary | ICD-10-CM

## 2020-01-07 DIAGNOSIS — M79644 Pain in right finger(s): Secondary | ICD-10-CM

## 2020-01-07 DIAGNOSIS — R2689 Other abnormalities of gait and mobility: Secondary | ICD-10-CM

## 2020-01-07 DIAGNOSIS — M6281 Muscle weakness (generalized): Secondary | ICD-10-CM

## 2020-01-07 DIAGNOSIS — M25562 Pain in left knee: Secondary | ICD-10-CM

## 2020-01-07 DIAGNOSIS — M25561 Pain in right knee: Secondary | ICD-10-CM

## 2020-01-07 DIAGNOSIS — R600 Localized edema: Secondary | ICD-10-CM

## 2020-01-07 NOTE — Therapy (Signed)
[facility] Physical Therapy [address] [place], [place], [address] Phone: [phone]   Fax:  [phone]  Physical Therapy Treatment  Patient Details  Name: April Castillo MRN: [id number] Date of Birth: Sep 11, 1955 Referring Provider (PT): Marlou Sa Tonna Corner, MD   Encounter Date: 01/07/2020   PT End of Session - 01/07/20 0925    Visit Number 2    Number of Visits 24    Date for PT Re-Evaluation 03/15/20    PT Start Time 0930    PT Stop Time 1010    PT Time Calculation (min) 40 min    Activity Tolerance Patient tolerated treatment well    Behavior During Therapy [facility] for tasks assessed/performed           Past Medical History:  Diagnosis Date  . Depression   . Heart murmur   . Hip fracture, right ([place]) 11/23/2010  . Hypertension   . Kidney stone   . KNEE PAIN, LEFT 11/07/2009  . Other and unspecified hyperlipidemia 01/01/2013  . PERIMENOPAUSAL SYNDROME 11/07/2009  . Rosacea 06/20/2006  . Tension headache 06/20/2006       . UTERINE FIBROID 06/20/2006    Past Surgical History:  Procedure Laterality Date  . CATARACT EXTRACTION W/ INTRAOCULAR LENS IMPLANT Left 08/03/13   Dr. Katy Fitch @ [place] [facility] [place]  . EYE SURGERY Right    Cataract  . ORIF PATELLA Right 10/29/2019   Procedure: OPEN REDUCTION INTERNAL (ORIF) FIXATION PATELLA;  Surgeon: Meredith Pel, MD;  Location: [place];  Service: Orthopedics;  Laterality: Right;  . r hip fracture and repair  January 24, 2010    There were no vitals filed for this visit.   Subjective Assessment - 01/07/20 0932    Subjective Pt. stated no specific pain today upon arrival, just some muscle soreness.  Hardest HEP is straight leg raise.    Pertinent History Rt hip ORIF 9 years ago    Limitations Standing;Walking;House hold activities    Patient Stated Goals improve ROM and strength in her knees    Currently in Pain? No/denies    Pain Score 0-No pain    Pain Onset More than a month ago                              [facility] Adult PT Treatment/Exercise - 01/07/20 0001      Neuro Re-ed    Neuro Re-ed Details  tandem ambulation in // bars fwd/rev 10 ft x 5, tandem stance 1 min x 1 bilateral,       Knee/Hip Exercises: Aerobic   Nustep Lvl 6 within 0-90 deg 9 mins      Knee/Hip Exercises: Seated   Other Seated Knee/Hip Exercises isometric holds 80 deg, 45 deg 5 sec on/off x 12 each      Knee/Hip Exercises: Supine   Bridges 10 reps    Other Supine Knee/Hip Exercises supine heel slide c SLR 2 x 10 bilateral LE      Manual Therapy   Manual therapy comments seated knee flexion c IR, distraction to 90 deg movement                    PT Short Term Goals - 01/07/20 0926      PT SHORT TERM GOAL #1   Title Pt will be I and compliant with inital HEP    Time 4    Period Weeks    Status On-going  PT SHORT TERM GOAL #2   Title Pt will improve Rt knee flexion ROM to 90 deg    Time 4    Period Weeks    Status On-going             PT Long Term Goals - 12/22/19 1015      PT LONG TERM GOAL #1   Title Pt will be I and compliant with final HEP    Time 12    Period Weeks    Status New    Target Date 03/15/20      PT LONG TERM GOAL #2   Title Pt will improve Rt knee AROM 0-120 deg to improve function.    Time 12    Period Weeks    Status New      PT LONG TERM GOAL #3   Title Pt will improve bilat hip strength to 4+ and bilat knee strength to 5 MMT    Time 12    Period Weeks    Status New      PT LONG TERM GOAL #4   Title Pt will be able to ambulate community distances 1000 ft on even and uneven surfaces and negotiate stairs with less than 3/10 pain or difficulty.    Time 12    Period Weeks    Status New                 Plan - 01/07/20 0951    Clinical Impression Statement Pt. demonstrated passive movement 0-90 deg today c end range stiffness noted for flexion.  Pt. maintined knee flexion in stance and lacked full TKE in terminal  swing c Rt LE.  Continued skilled PT services indicated at this time.  No brace wearing noted today to clinic.    Examination-Activity Limitations Bend;Carry;Lift;Stand;Stairs;Squat;Locomotion Level;Transfers    Examination-Participation Restrictions Cleaning;Driving;Yard Work;Shop    Stability/Clinical Decision Making Evolving/Moderate complexity    Rehab Potential Good    PT Frequency 2x / week    PT Duration 12 weeks    PT Treatment/Interventions ADLs/Self Care Home Management;Cryotherapy;Electrical Stimulation;Iontophoresis 4mg /ml Dexamethasone;Moist Heat;Ultrasound;Gait training;Stair training;Therapeutic activities;Therapeutic exercise;Neuromuscular re-education;Balance training;Manual techniques;Passive range of motion;Dry needling;Joint Manipulations;Vasopneumatic Device;Taping    PT Next Visit Plan Motion to 90 deg, improve strength and balance control in available range.    PT Home Exercise Plan Access Code: [email or web address]           Patient will benefit from skilled therapeutic intervention in order to improve the following deficits and impairments:  Abnormal gait, Decreased activity tolerance, Decreased endurance, Decreased mobility, Decreased strength, Decreased range of motion, Difficulty walking, Hypomobility, Increased edema, Impaired flexibility, Increased muscle spasms, Increased fascial restricitons, Impaired UE functional use, Pain  Visit Diagnosis: Stiffness of right knee, not elsewhere classified  Acute pain of right knee  Acute pain of left knee  Muscle weakness (generalized)  Other abnormalities of gait and mobility  Localized edema  Pain in right finger(s)     Problem List Patient Active Problem List   Diagnosis Date Noted  . Closed patellar sleeve fracture, left, initial encounter 11/10/2019  . Closed patellar sleeve fracture of right knee 10/28/2019  . Finger fracture, right 10/28/2019  . Conjunctivitis 11/07/2017  . Hyperlipidemia 01/01/2013  . Allergic  rhinitis 12/12/2011  . Breast mass, right 11/23/2010  . WEIGHT GAIN 11/07/2009  . Depression, major, single episode, in partial remission ([place]) 09/26/2006  . Essential hypertension 09/26/2006   Scot Jun, PT, DPT, OCS, ATC 01/07/20  10:02 AM  [facility] Physical Therapy [address] [place], [place], [address] Phone: [phone]   Fax:  [phone]  Name: April Castillo MRN: [id number] Date of Birth: [birth year]

## 2020-01-08 ENCOUNTER — Ambulatory Visit: Payer: Medicare Other | Admitting: Physical Therapy

## 2020-01-08 ENCOUNTER — Encounter: Payer: Self-pay | Admitting: Physical Therapy

## 2020-01-08 DIAGNOSIS — M25661 Stiffness of right knee, not elsewhere classified: Secondary | ICD-10-CM | POA: Diagnosis not present

## 2020-01-08 DIAGNOSIS — M25561 Pain in right knee: Secondary | ICD-10-CM

## 2020-01-08 DIAGNOSIS — M25562 Pain in left knee: Secondary | ICD-10-CM | POA: Diagnosis not present

## 2020-01-08 DIAGNOSIS — M79644 Pain in right finger(s): Secondary | ICD-10-CM

## 2020-01-08 DIAGNOSIS — R2689 Other abnormalities of gait and mobility: Secondary | ICD-10-CM

## 2020-01-08 DIAGNOSIS — M6281 Muscle weakness (generalized): Secondary | ICD-10-CM

## 2020-01-08 DIAGNOSIS — R600 Localized edema: Secondary | ICD-10-CM

## 2020-01-08 NOTE — Therapy (Signed)
[facility] Physical Therapy [address] [place], [place], [address] Phone: [phone]   Fax:  [phone]  Physical Therapy Treatment  Patient Details  Name: April Castillo MRN: [id number] Date of Birth: 1955/06/25 Referring Provider (PT): Marlou Sa Tonna Corner, MD   Encounter Date: 01/08/2020   PT End of Session - 01/08/20 1428    Visit Number 3    Number of Visits 24    Date for PT Re-Evaluation 03/15/20    PT Start Time 1340    PT Stop Time 1425    PT Time Calculation (min) 45 min    Activity Tolerance Patient tolerated treatment well    Behavior During Therapy [facility] for tasks assessed/performed           Past Medical History:  Diagnosis Date  . Depression   . Heart murmur   . Hip fracture, right ([place]) 11/23/2010  . Hypertension   . Kidney stone   . KNEE PAIN, LEFT 11/07/2009  . Other and unspecified hyperlipidemia 01/01/2013  . PERIMENOPAUSAL SYNDROME 11/07/2009  . Rosacea 06/20/2006  . Tension headache 06/20/2006       . UTERINE FIBROID 06/20/2006    Past Surgical History:  Procedure Laterality Date  . CATARACT EXTRACTION W/ INTRAOCULAR LENS IMPLANT Left 08/03/13   Dr. Katy Fitch @ [place] [facility] [place]  . EYE SURGERY Right    Cataract  . ORIF PATELLA Right 10/29/2019   Procedure: OPEN REDUCTION INTERNAL (ORIF) FIXATION PATELLA;  Surgeon: Meredith Pel, MD;  Location: [place];  Service: Orthopedics;  Laterality: Right;  . r hip fracture and repair  January 24, 2010    There were no vitals filed for this visit.   Subjective Assessment - 01/08/20 1348    Subjective relays not a lot of pain upon arrival, biggest complaint is stiffness    Pertinent History Rt hip ORIF 9 years ago    Limitations Standing;Walking;House hold activities    Patient Stated Goals improve ROM and strength in her knees    Pain Onset More than a month ago           [facility] Adult PT Treatment/Exercise - 01/08/20 0001      Neuro Re-ed    Neuro Re-ed Details  tandem ambulation, retro  walking, march walking, and sidestepping  in // bars up/down X 3 reps ea      Knee/Hip Exercises: Stretches   Knee: Self-Stretch Limitations tailgate AAROM heelslides 10 sec X 10 reps, with self resisted LAQ in between      Knee/Hip Exercises: Aerobic   Nustep Lv 5 0-90 deg 10 min      Knee/Hip Exercises: Machines for Strengthening   Cybex Leg Press seat set to allow 0-85 deg performed bilat push at 50 lbs 2X15 then dropped to Rt leg only 12 lbs 3X10      Knee/Hip Exercises: Seated   Long Arc Quad Right;3 sets;10 reps    Long Arc Quad Weight 3 lbs.    Hamstring Curl Right;2 sets;15 reps    Hamstring Limitations green    Sit to Sand 2 sets;10 reps;without UE support   from raised mat table, Rt foot behind for more wt shift     Knee/Hip Exercises: Supine   Straight Leg Raises Right;2 sets;15 reps      Manual Therapy   Manual therapy comments supine Rt knee PROM, flexion mobs, patella mobs                    PT  Short Term Goals - 01/07/20 0926      PT SHORT TERM GOAL #1   Title Pt will be I and compliant with inital HEP    Time 4    Period Weeks    Status On-going      PT SHORT TERM GOAL #2   Title Pt will improve Rt knee flexion ROM to 90 deg    Time 4    Period Weeks    Status On-going             PT Long Term Goals - 12/22/19 1015      PT LONG TERM GOAL #1   Title Pt will be I and compliant with final HEP    Time 12    Period Weeks    Status New    Target Date 03/15/20      PT LONG TERM GOAL #2   Title Pt will improve Rt knee AROM 0-120 deg to improve function.    Time 12    Period Weeks    Status New      PT LONG TERM GOAL #3   Title Pt will improve bilat hip strength to 4+ and bilat knee strength to 5 MMT    Time 12    Period Weeks    Status New      PT LONG TERM GOAL #4   Title Pt will be able to ambulate community distances 1000 ft on even and uneven surfaces and negotiate stairs with less than 3/10 pain or difficulty.    Time 12     Period Weeks    Status New                 Plan - 01/08/20 1428    Clinical Impression Statement She is making great progress with PT, continued to stay in her ROM restriction 0-90. She will see MD next week and PT will await to see when we can progress her past 90,    Examination-Activity Limitations Bend;Carry;Lift;Stand;Stairs;Squat;Locomotion Level;Transfers    Examination-Participation Restrictions Cleaning;Driving;Yard Work;Shop    Stability/Clinical Decision Making Evolving/Moderate complexity    Rehab Potential Good    PT Frequency 2x / week    PT Duration 12 weeks    PT Treatment/Interventions ADLs/Self Care Home Management;Cryotherapy;Electrical Stimulation;Iontophoresis 4mg /ml Dexamethasone;Moist Heat;Ultrasound;Gait training;Stair training;Therapeutic activities;Therapeutic exercise;Neuromuscular re-education;Balance training;Manual techniques;Passive range of motion;Dry needling;Joint Manipulations;Vasopneumatic Device;Taping    PT Next Visit Plan Motion to 90 deg, improve strength and balance control in available range.    PT Home Exercise Plan Access Code: [email or web address]           Patient will benefit from skilled therapeutic intervention in order to improve the following deficits and impairments:  Abnormal gait, Decreased activity tolerance, Decreased endurance, Decreased mobility, Decreased strength, Decreased range of motion, Difficulty walking, Hypomobility, Increased edema, Impaired flexibility, Increased muscle spasms, Increased fascial restricitons, Impaired UE functional use, Pain  Visit Diagnosis: Stiffness of right knee, not elsewhere classified  Acute pain of right knee  Acute pain of left knee  Muscle weakness (generalized)  Other abnormalities of gait and mobility  Localized edema  Pain in right finger(s)     Problem List Patient Active Problem List   Diagnosis Date Noted  . Closed patellar sleeve fracture, left, initial encounter 11/10/2019    . Closed patellar sleeve fracture of right knee 10/28/2019  . Finger fracture, right 10/28/2019  . Conjunctivitis 11/07/2017  . Hyperlipidemia 01/01/2013  . Allergic rhinitis 12/12/2011  . Breast mass, right 11/23/2010  .  WEIGHT GAIN 11/07/2009  . Depression, major, single episode, in partial remission ([place]) 09/26/2006  . Essential hypertension 09/26/2006    April Castillo 01/08/2020, 2:30 PM  [facility] Physical Therapy [address] [place], [place], [address] Phone: [phone]   Fax:  [phone]  Name: April Castillo MRN: [id number] Date of Birth: 06-19-1955

## 2020-01-12 ENCOUNTER — Other Ambulatory Visit: Payer: Self-pay

## 2020-01-12 ENCOUNTER — Ambulatory Visit: Payer: Medicare Other | Admitting: Physical Therapy

## 2020-01-12 ENCOUNTER — Encounter: Payer: Self-pay | Admitting: Physical Therapy

## 2020-01-12 DIAGNOSIS — M25561 Pain in right knee: Secondary | ICD-10-CM

## 2020-01-12 DIAGNOSIS — R600 Localized edema: Secondary | ICD-10-CM

## 2020-01-12 DIAGNOSIS — R2689 Other abnormalities of gait and mobility: Secondary | ICD-10-CM

## 2020-01-12 DIAGNOSIS — M79644 Pain in right finger(s): Secondary | ICD-10-CM

## 2020-01-12 DIAGNOSIS — M6281 Muscle weakness (generalized): Secondary | ICD-10-CM

## 2020-01-12 DIAGNOSIS — M25562 Pain in left knee: Secondary | ICD-10-CM

## 2020-01-12 DIAGNOSIS — M25661 Stiffness of right knee, not elsewhere classified: Secondary | ICD-10-CM | POA: Diagnosis not present

## 2020-01-12 NOTE — Therapy (Signed)
[facility] Physical Therapy [address] [place], [place], [address] Phone: [phone]   Fax:  [phone]  Physical Therapy Treatment/MD progress note  Patient Details  Name: April Castillo MRN: [id number] Date of Birth: April 29, 1955 Referring Provider (PT): Marlou Sa Tonna Corner, MD   Encounter Date: 01/12/2020   PT End of Session - 01/12/20 1333    Visit Number 4    Number of Visits 24    Date for PT Re-Evaluation 03/15/20    PT Start Time 9292    PT Stop Time 1345    PT Time Calculation (min) 42 min    Activity Tolerance Patient tolerated treatment well    Behavior During Therapy [facility] for tasks assessed/performed           Past Medical History:  Diagnosis Date  . Depression   . Heart murmur   . Hip fracture, right ([place]) 11/23/2010  . Hypertension   . Kidney stone   . KNEE PAIN, LEFT 11/07/2009  . Other and unspecified hyperlipidemia 01/01/2013  . PERIMENOPAUSAL SYNDROME 11/07/2009  . Rosacea 06/20/2006  . Tension headache 06/20/2006       . UTERINE FIBROID 06/20/2006    Past Surgical History:  Procedure Laterality Date  . CATARACT EXTRACTION W/ INTRAOCULAR LENS IMPLANT Left 08/03/13   Dr. Katy Fitch @ [place] [place]  . EYE SURGERY Right    Cataract  . ORIF PATELLA Right 10/29/2019   Procedure: OPEN REDUCTION INTERNAL (ORIF) FIXATION PATELLA;  Surgeon: Meredith Pel, MD;  Location: [place];  Service: Orthopedics;  Laterality: Right;  . r hip fracture and repair  January 24, 2010    There were no vitals filed for this visit.   Subjective Assessment - 01/12/20 1332    Subjective not much pain upon arrival, more stiffness than anything.    Pertinent History Rt hip ORIF 9 years ago    Limitations Standing;Walking;House hold activities    Patient Stated Goals improve ROM and strength in her knees    Pain Onset More than a month ago              [facility] PT Assessment - 01/12/20 0001      Assessment   Medical Diagnosis Closed sleeve fracture of right  and left patella with routine healing, subsequent encounter with Rt patella ORIF 10/29/19. Rt finger fracture    Referring Provider (PT) Marlou Sa Tonna Corner, MD    Onset Date/Surgical Date 10/29/19    Next MD Visit 01/15/20      PROM   Right Knee Extension 0    Right Knee Flexion 90      Strength   Right Knee Flexion 4/5    Right Knee Extension 4-/5                         [facility] Adult PT Treatment/Exercise - 01/12/20 0001      Knee/Hip Exercises: Stretches   Knee: Self-Stretch Limitations tailgate AAROM heelslides 10 sec X 10 reps, with self resisted LAQ in between for contract-relax MET    Other Knee/Hip Stretches supine heelslides with strap 10 sec X 10      Knee/Hip Exercises: Aerobic   Nustep Lv 3 0-90 deg 10 min, seat 8      Knee/Hip Exercises: Machines for Strengthening   Cybex Leg Press seat set to allow 0-90 deg performed bilat push at 56 lbs 2X15 then dropped to Rt leg only 12 lbs 3X10  Knee/Hip Exercises: Standing   Lateral Step Up Right;15 reps;Hand Hold: 1;Step Height: 4"    Forward Step Up Right;15 reps;Hand Hold: 1;Step Height: 4"    Functional Squat 15 reps    Functional Squat Limitations mini squats with UE support       Knee/Hip Exercises: Seated   Long Arc Quad Right;3 sets;10 reps    Long Arc Quad Weight 4 lbs.    Hamstring Curl Right;2 sets;15 reps    Hamstring Limitations green    Sit to Sand 2 sets;10 reps;without UE support   Rt foot further behind                   PT Short Term Goals - 01/12/20 1341      PT SHORT TERM GOAL #1   Title Pt will be I and compliant with inital HEP    Time 4    Period Weeks    Status Achieved      PT SHORT TERM GOAL #2   Title Pt will improve Rt knee flexion ROM to 90 deg    Time 4    Period Weeks    Status Achieved             PT Long Term Goals - 01/12/20 1341      PT LONG TERM GOAL #1   Title Pt will be I and compliant with final HEP    Time 12    Period Weeks    Status  On-going      PT LONG TERM GOAL #2   Title Pt will improve Rt knee AROM 0-120 deg to improve function.    Baseline now 0-90    Time 12    Period Weeks    Status On-going      PT LONG TERM GOAL #3   Title Pt will improve bilat hip strength to 4+ and bilat knee strength to 5 MMT    Baseline now 4/5    Time 12    Period Weeks    Status On-going      PT LONG TERM GOAL #4   Title Pt will be able to ambulate community distances 1000 ft on even and uneven surfaces and negotiate stairs with less than 3/10 pain or difficulty.    Time 12    Period Weeks    Status On-going                 Plan - 01/12/20 1335    Clinical Impression Statement Continued with gentle strength progression as tolerated. She can achieve 90 degreens of knee flexion but has restriciton not to go past this. She sees MD 9/24 and PT will await to see if we can progress ROM.    Examination-Activity Limitations Bend;Carry;Lift;Stand;Stairs;Squat;Locomotion Level;Transfers    Examination-Participation Restrictions Cleaning;Driving;Yard Work;Shop    Stability/Clinical Decision Making Evolving/Moderate complexity    Rehab Potential Good    PT Frequency 2x / week    PT Duration 12 weeks    PT Treatment/Interventions ADLs/Self Care Home Management;Cryotherapy;Electrical Stimulation;Iontophoresis 78m/ml Dexamethasone;Moist Heat;Ultrasound;Gait training;Stair training;Therapeutic activities;Therapeutic exercise;Neuromuscular re-education;Balance training;Manual techniques;Passive range of motion;Dry needling;Joint Manipulations;Vasopneumatic Device;Taping    PT Next Visit Plan Motion to 90 deg unless MD allows progression from 9/24 follow up, improve strength and balance control in available range.    PT Home Exercise Plan Access Code: [email or web address]    [email or web address] Agree with Plan of Care Patient           Patient will benefit from  skilled therapeutic intervention in order to improve the following deficits and  impairments:  Abnormal gait, Decreased activity tolerance, Decreased endurance, Decreased mobility, Decreased strength, Decreased range of motion, Difficulty walking, Hypomobility, Increased edema, Impaired flexibility, Increased muscle spasms, Increased fascial restricitons, Impaired UE functional use, Pain  Visit Diagnosis: Stiffness of right knee, not elsewhere classified  Acute pain of right knee  Acute pain of left knee  Muscle weakness (generalized)  Other abnormalities of gait and mobility  Localized edema  Pain in right finger(s)     Problem List Patient Active Problem List   Diagnosis Date Noted  . Closed patellar sleeve fracture, left, initial encounter 11/10/2019  . Closed patellar sleeve fracture of right knee 10/28/2019  . Finger fracture, right 10/28/2019  . Conjunctivitis 11/07/2017  . Hyperlipidemia 01/01/2013  . Allergic rhinitis 12/12/2011  . Breast mass, right 11/23/2010  . WEIGHT GAIN 11/07/2009  . Depression, major, single episode, in partial remission ([place]) 09/26/2006  . Essential hypertension 09/26/2006    Silvestre Mesi 01/12/2020, 1:49 PM  [facility] Physical Therapy [address] [place], [place], [address] Phone: [phone]   Fax:  [phone]  Name: RAINI TILEY MRN: [id number] Date of Birth: 1956-01-06

## 2020-01-15 ENCOUNTER — Ambulatory Visit (INDEPENDENT_AMBULATORY_CARE_PROVIDER_SITE_OTHER): Payer: Medicare Other | Admitting: Orthopedic Surgery

## 2020-01-15 ENCOUNTER — Encounter: Payer: Self-pay | Admitting: Orthopedic Surgery

## 2020-01-15 ENCOUNTER — Ambulatory Visit: Payer: Medicare Other | Admitting: Physical Therapy

## 2020-01-15 ENCOUNTER — Other Ambulatory Visit: Payer: Self-pay

## 2020-01-15 VITALS — Ht 66.0 in | Wt 165.0 lb

## 2020-01-15 DIAGNOSIS — R2689 Other abnormalities of gait and mobility: Secondary | ICD-10-CM

## 2020-01-15 DIAGNOSIS — M25561 Pain in right knee: Secondary | ICD-10-CM | POA: Diagnosis not present

## 2020-01-15 DIAGNOSIS — M25661 Stiffness of right knee, not elsewhere classified: Secondary | ICD-10-CM

## 2020-01-15 DIAGNOSIS — S82091D Other fracture of right patella, subsequent encounter for closed fracture with routine healing: Secondary | ICD-10-CM

## 2020-01-15 DIAGNOSIS — M79644 Pain in right finger(s): Secondary | ICD-10-CM

## 2020-01-15 DIAGNOSIS — M25562 Pain in left knee: Secondary | ICD-10-CM | POA: Diagnosis not present

## 2020-01-15 DIAGNOSIS — R600 Localized edema: Secondary | ICD-10-CM

## 2020-01-15 DIAGNOSIS — M6281 Muscle weakness (generalized): Secondary | ICD-10-CM

## 2020-01-15 NOTE — Therapy (Signed)
[facility] Physical Therapy [address] [place], [place], [address] Phone: [phone]   Fax:  [phone]  Physical Therapy Treatment  Patient Details  Name: April Castillo MRN: [id number] Date of Birth: 1955/08/22 Referring Provider (PT): Marlou Sa Tonna Corner, MD   Encounter Date: 01/15/2020   PT End of Session - 01/15/20 1426    Visit Number 5    Number of Visits 24    Date for PT Re-Evaluation 03/15/20    PT Start Time 7824    PT Stop Time 1427    PT Time Calculation (min) 42 min    Activity Tolerance Patient tolerated treatment well    Behavior During Therapy [facility] for tasks assessed/performed           Past Medical History:  Diagnosis Date   Depression    Heart murmur    Hip fracture, right ([place]) 11/23/2010   Hypertension    Kidney stone    KNEE PAIN, LEFT 11/07/2009   Other and unspecified hyperlipidemia 01/01/2013   PERIMENOPAUSAL SYNDROME 11/07/2009   Rosacea 06/20/2006   Tension headache 06/20/2006        UTERINE FIBROID 06/20/2006    Past Surgical History:  Procedure Laterality Date   CATARACT EXTRACTION W/ INTRAOCULAR LENS IMPLANT Left 08/03/13   Dr. Katy Fitch @ [place] [facility] [place] Right    Cataract   ORIF PATELLA Right 10/29/2019   Procedure: OPEN REDUCTION INTERNAL (ORIF) FIXATION PATELLA;  Surgeon: Meredith Pel, MD;  Location: [place];  Service: Orthopedics;  Laterality: Right;   r hip fracture and repair  January 24, 2010    There were no vitals filed for this visit.   Subjective Assessment - 01/15/20 1356    Subjective not much pain upon arrival, but her knee feels tired and stiff. She had MD follow up who now will allow more than 90 deg of knee flexion ROM.    Pertinent History Rt hip ORIF 9 years ago    Limitations Standing;Walking;House hold activities    Patient Stated Goals improve ROM and strength in her knees    Pain Onset More than a month ago              [facility] PT Assessment - 01/15/20 0001       Assessment   Medical Diagnosis Closed sleeve fracture of right and left patella with routine healing, subsequent encounter with Rt patella ORIF 10/29/19. Rt finger fracture    Referring Provider (PT) Marlou Sa Tonna Corner, MD    Onset Date/Surgical Date 10/29/19      PROM   Right Knee Extension 0    Right Knee Flexion 100              [facility] Adult PT Treatment/Exercise - 01/15/20 0001      Knee/Hip Exercises: Stretches   Quad Stretch Right;4 reps;30 seconds    Quad Stretch Limitations prone with strap    Knee: Self-Stretch Limitations tailgate AAROM heelslides 10 sec X 10 reps, with self resisted LAQ in between for contract-relax MET    Other Knee/Hip Stretches --      Knee/Hip Exercises: Aerobic   Nustep L5 seat 7      Knee/Hip Exercises: Machines for Strengthening   Cybex Leg Press performed bilat push at 75 lbs 3X10 then dropped to Rt leg only 18 lbs 3X10      Knee/Hip Exercises: Standing   Lateral Step Up Right;15 reps;Hand Hold: 1;Step Height: 6"    Forward Step Up  Right;15 reps;Hand Hold: 1;Step Height: 6"    Step Down Right;10 reps;Step Height: 6";Hand Hold: 1   stepping down with left, needs heavy UE support   Functional Squat 15 reps    Functional Squat Limitations mini squats with UE support     Other Standing Knee Exercises lateral walking at counter with green band around knees while maintaining mini squat up/down X 5 reps      Manual Therapy   Manual therapy comments supine Rt knee PROM, flexion mobs, patella mobs                    PT Short Term Goals - 01/12/20 1341      PT SHORT TERM GOAL #1   Title Pt will be I and compliant with inital HEP    Time 4    Period Weeks    Status Achieved      PT SHORT TERM GOAL #2   Title Pt will improve Rt knee flexion ROM to 90 deg    Time 4    Period Weeks    Status Achieved             PT Long Term Goals - 01/12/20 1341      PT LONG TERM GOAL #1   Title Pt will be I and compliant with final HEP     Time 12    Period Weeks    Status On-going      PT LONG TERM GOAL #2   Title Pt will improve Rt knee AROM 0-120 deg to improve function.    Baseline now 0-90    Time 12    Period Weeks    Status On-going      PT LONG TERM GOAL #3   Title Pt will improve bilat hip strength to 4+ and bilat knee strength to 5 MMT    Baseline now 4/5    Time 12    Period Weeks    Status On-going      PT LONG TERM GOAL #4   Title Pt will be able to ambulate community distances 1000 ft on even and uneven surfaces and negotiate stairs with less than 3/10 pain or difficulty.    Time 12    Period Weeks    Status On-going                 Plan - 01/15/20 1427    Clinical Impression Statement She has made good overall progres this week. MD will now allow Rt knee ROM past 90 deg and after stretching and manual she had 100 deg of flexion PROM. She was able to progress her reistance with strength training today and progress step height from 4 inch so 6 inch with good tolerance. PT will continue to progress as able.    Examination-Activity Limitations Bend;Carry;Lift;Stand;Stairs;Squat;Locomotion Level;Transfers    Examination-Participation Restrictions Cleaning;Driving;Yard Work;Shop    Stability/Clinical Decision Making Evolving/Moderate complexity    Rehab Potential Good    PT Frequency 2x / week    PT Duration 12 weeks    PT Treatment/Interventions ADLs/Self Care Home Management;Cryotherapy;Electrical Stimulation;Iontophoresis 71m/ml Dexamethasone;Moist Heat;Ultrasound;Gait training;Stair training;Therapeutic activities;Therapeutic exercise;Neuromuscular re-education;Balance training;Manual techniques;Passive range of motion;Dry needling;Joint Manipulations;Vasopneumatic Device;Taping    PT Next Visit Plan now without ROM resticiton so progress knee flexion ROM and hip/knee strength as able.    PT Home Exercise Plan Access Code: [email or web address]    [email or web address] Agree with Plan of Care Patient  Patient will benefit from skilled therapeutic intervention in order to improve the following deficits and impairments:  Abnormal gait, Decreased activity tolerance, Decreased endurance, Decreased mobility, Decreased strength, Decreased range of motion, Difficulty walking, Hypomobility, Increased edema, Impaired flexibility, Increased muscle spasms, Increased fascial restricitons, Impaired UE functional use, Pain  Visit Diagnosis: Stiffness of right knee, not elsewhere classified  Acute pain of right knee  Acute pain of left knee  Muscle weakness (generalized)  Other abnormalities of gait and mobility  Localized edema  Pain in right finger(s)     Problem List Patient Active Problem List   Diagnosis Date Noted   Closed patellar sleeve fracture, left, initial encounter 11/10/2019   Closed patellar sleeve fracture of right knee 10/28/2019   Finger fracture, right 10/28/2019   Conjunctivitis 11/07/2017   Hyperlipidemia 01/01/2013   Allergic rhinitis 12/12/2011   Breast mass, right 11/23/2010   WEIGHT GAIN 11/07/2009   Depression, major, single episode, in partial remission ([place]) 09/26/2006   Essential hypertension 09/26/2006    Silvestre Mesi 01/15/2020, 2:30 PM  [facility] Physical Therapy [address] [place], [place], [address] Phone: [phone]   Fax:  [phone]  Name: ALVIN RUBANO MRN: [id number] Date of Birth: [birth year]

## 2020-01-15 NOTE — Progress Notes (Signed)
Post-Op Visit Note   Patient: April Castillo           Date of Birth: 07/25/1955           MRN: [id number] Visit Date: 01/15/2020 PCP: Gifford Shave, MD   Assessment & Plan:  Chief Complaint:  Chief Complaint  Patient presents with  . Right Knee - Fracture, Follow-up  . Left Knee - Follow-up, Fracture  . Right Index Finger - Follow-up   Visit Diagnoses: No diagnosis found.  Plan: April Castillo is now 2-1/2 months out from right patella open reduction internal fixation 10/29/2019.  90 degrees range of motion doing well.  I think is fine for her to go past 90 degrees at this time.  Left knee has full range of motion with no patellar tenderness.  Right index finger has about 90 degrees of flexion at the PIP joint.  Not taking any medication for pain.  I really want her to continue to work harder on getting that finger fracture moving.  Come back in 6 weeks for clinical recheck on right patella range of motion.  No calf tenderness negative Homans bilateral lower extremities today.  Follow-Up Instructions: Return in about 6 weeks (around 02/26/2020).   Orders:  No orders of the defined types were placed in this encounter.  No orders of the defined types were placed in this encounter.   Imaging: No results found.  PMFS History: Patient Active Problem List   Diagnosis Date Noted  . Closed patellar sleeve fracture, left, initial encounter 11/10/2019  . Closed patellar sleeve fracture of right knee 10/28/2019  . Finger fracture, right 10/28/2019  . Conjunctivitis 11/07/2017  . Hyperlipidemia 01/01/2013  . Allergic rhinitis 12/12/2011  . Breast mass, right 11/23/2010  . WEIGHT GAIN 11/07/2009  . Depression, major, single episode, in partial remission ([place]) 09/26/2006  . Essential hypertension 09/26/2006   Past Medical History:  Diagnosis Date  . Depression   . Heart murmur   . Hip fracture, right ([place]) 11/23/2010  . Hypertension   . Kidney stone   . KNEE PAIN, LEFT 11/07/2009  .  Other and unspecified hyperlipidemia 01/01/2013  . PERIMENOPAUSAL SYNDROME 11/07/2009  . Rosacea 06/20/2006  . Tension headache 06/20/2006       . UTERINE FIBROID 06/20/2006    Family History  Problem Relation Age of Onset  . Cancer Father   . Hypertension Father   . Cancer Sister   . Thyroid disease Sister   . Breast cancer Sister   . Cancer Maternal Uncle   . Diabetes Neg Hx   . Heart disease Neg Hx     Past Surgical History:  Procedure Laterality Date  . CATARACT EXTRACTION W/ INTRAOCULAR LENS IMPLANT Left 08/03/13   Dr. Katy Fitch @ [place] [facility] [place]  . EYE SURGERY Right    Cataract  . ORIF PATELLA Right 10/29/2019   Procedure: OPEN REDUCTION INTERNAL (ORIF) FIXATION PATELLA;  Surgeon: Meredith Pel, MD;  Location: [place];  Service: Orthopedics;  Laterality: Right;  . r hip fracture and repair  January 24, 2010   Social History   Occupational History  . 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 Smoking status: Never Smoker  . Smokeless tobacco: Never Used  Substance and Sexual Activity  . Alcohol use: Yes    Alcohol/week: 3.0 standard drinks    Types: 1 Glasses of wine, 2 Cans of beer per week    Comment: few drinks a week  .  Drug use: No  . Sexual activity: Yes    Birth control/protection: None

## 2020-01-19 ENCOUNTER — Ambulatory Visit: Payer: Medicare Other | Admitting: Physical Therapy

## 2020-01-19 ENCOUNTER — Other Ambulatory Visit: Payer: Self-pay

## 2020-01-19 DIAGNOSIS — R600 Localized edema: Secondary | ICD-10-CM

## 2020-01-19 DIAGNOSIS — R2689 Other abnormalities of gait and mobility: Secondary | ICD-10-CM | POA: Diagnosis not present

## 2020-01-19 DIAGNOSIS — M25661 Stiffness of right knee, not elsewhere classified: Secondary | ICD-10-CM | POA: Diagnosis not present

## 2020-01-19 DIAGNOSIS — M6281 Muscle weakness (generalized): Secondary | ICD-10-CM | POA: Diagnosis not present

## 2020-01-19 DIAGNOSIS — M25561 Pain in right knee: Secondary | ICD-10-CM

## 2020-01-19 NOTE — Therapy (Signed)
[facility] Physical Therapy [address] [place], [place], [address] Phone: [phone]   Fax:  [phone]  Physical Therapy Treatment  Patient Details  Name: April Castillo MRN: [id number] Date of Birth: December 09, 1955 Referring Provider (PT): Marlou Sa Tonna Corner, MD   Encounter Date: 01/19/2020   PT End of Session - 01/19/20 0910    Visit Number 6    Number of Visits 24    Date for PT Re-Evaluation 03/15/20    PT Start Time 0845    PT Stop Time 0925    PT Time Calculation (min) 40 min    Activity Tolerance Patient tolerated treatment well    Behavior During Therapy [facility] for tasks assessed/performed           Past Medical History:  Diagnosis Date   Depression    Heart murmur    Hip fracture, right ([place]) 11/23/2010   Hypertension    Kidney stone    KNEE PAIN, LEFT 11/07/2009   Other and unspecified hyperlipidemia 01/01/2013   PERIMENOPAUSAL SYNDROME 11/07/2009   Rosacea 06/20/2006   Tension headache 06/20/2006        UTERINE FIBROID 06/20/2006    Past Surgical History:  Procedure Laterality Date   CATARACT EXTRACTION W/ INTRAOCULAR LENS IMPLANT Left 08/03/13   Dr. Katy Fitch @ [place] [facility] [place] Right    Cataract   ORIF PATELLA Right 10/29/2019   Procedure: OPEN REDUCTION INTERNAL (ORIF) FIXATION PATELLA;  Surgeon: Meredith Pel, MD;  Location: [place];  Service: Orthopedics;  Laterality: Right;   r hip fracture and repair  January 24, 2010    There were no vitals filed for this visit.   Subjective Assessment - 01/19/20 0846    Subjective denies pain upon arrival but does have pain at end range stretching still due to her continued stiffness    Pertinent History Rt hip ORIF 9 years ago    Limitations Standing;Walking;House hold activities    Patient Stated Goals improve ROM and strength in her knees    Pain Onset More than a month ago              [facility] Adult PT Treatment/Exercise - 01/19/20 0001      Knee/Hip Exercises:  Stretches   Sports administrator Right;4 reps;30 seconds    Quad Stretch Limitations prone with strap    Knee: Self-Stretch Limitations standing lunge stretch with Rt foot on 6 in step 10 sec X 10 reps, tailgate AAROM heelslides 10 sec X 10 reps, with self resisted LAQ in between for contract-relax MET      Knee/Hip Exercises: Aerobic   Recumbent Bike seat 7 rocking first 5 min holding 5 sec, then moved to seat 8 and able to perform full revoutions 3 more minutes with some compensations      Knee/Hip Exercises: Machines for Strengthening   Cybex Leg Press performed bilat push at 81 lbs 3X10 then dropped to Rt leg only 25 lbs 3X10      Knee/Hip Exercises: Standing   Lateral Step Up Right;Hand Hold: 1;Step Height: 6"    Lateral Step Up Limitations step up and overs counting over and back as one rep X 10 reps    Forward Step Up Right;15 reps;Hand Hold: 1;Step Height: 6"    Step Down Right;10 reps;Step Height: 6";Hand Hold: 1   stepping down with left, needs heavy UE support   Functional Squat Limitations squats with UE support 2 sets of 10    Other Standing  Knee Exercises lateral walking at counter with green band around knees while maintaining mini squat up/down X 5 reps      Knee/Hip Exercises: Seated   Long Arc Quad Right;3 sets;10 reps    Long Arc Quad Weight 5 lbs.      Manual Therapy   Manual therapy comments supine Rt knee PROM, flexion mobs, patella mobs                    PT Short Term Goals - 01/12/20 1341      PT SHORT TERM GOAL #1   Title Pt will be I and compliant with inital HEP    Time 4    Period Weeks    Status Achieved      PT SHORT TERM GOAL #2   Title Pt will improve Rt knee flexion ROM to 90 deg    Time 4    Period Weeks    Status Achieved             PT Long Term Goals - 01/12/20 1341      PT LONG TERM GOAL #1   Title Pt will be I and compliant with final HEP    Time 12    Period Weeks    Status On-going      PT LONG TERM GOAL #2   Title Pt  will improve Rt knee AROM 0-120 deg to improve function.    Baseline now 0-90    Time 12    Period Weeks    Status On-going      PT LONG TERM GOAL #3   Title Pt will improve bilat hip strength to 4+ and bilat knee strength to 5 MMT    Baseline now 4/5    Time 12    Period Weeks    Status On-going      PT LONG TERM GOAL #4   Title Pt will be able to ambulate community distances 1000 ft on even and uneven surfaces and negotiate stairs with less than 3/10 pain or difficulty.    Time 12    Period Weeks    Status On-going                 Plan - 01/19/20 0912    Clinical Impression Statement She continues to progress well with PT but still with significant deficits in Rt knee flexion ROM and strength and will continue to benefit from PT.    Examination-Activity Limitations Bend;Carry;Lift;Stand;Stairs;Squat;Locomotion Level;Transfers    Examination-Participation Restrictions Cleaning;Driving;Yard Work;Shop    Stability/Clinical Decision Making Evolving/Moderate complexity    Rehab Potential Good    PT Frequency 2x / week    PT Duration 12 weeks    PT Treatment/Interventions ADLs/Self Care Home Management;Cryotherapy;Electrical Stimulation;Iontophoresis 19m/ml Dexamethasone;Moist Heat;Ultrasound;Gait training;Stair training;Therapeutic activities;Therapeutic exercise;Neuromuscular re-education;Balance training;Manual techniques;Passive range of motion;Dry needling;Joint Manipulations;Vasopneumatic Device;Taping    PT Next Visit Plan now without ROM resticiton so progress knee flexion ROM and hip/knee strength as able.    PT Home Exercise Plan Access Code: [email or web address]    [email or web address] Agree with Plan of Care Patient           Patient will benefit from skilled therapeutic intervention in order to improve the following deficits and impairments:  Abnormal gait, Decreased activity tolerance, Decreased endurance, Decreased mobility, Decreased strength, Decreased range of motion,  Difficulty walking, Hypomobility, Increased edema, Impaired flexibility, Increased muscle spasms, Increased fascial restricitons, Impaired UE functional use, Pain  Visit Diagnosis: Stiffness of right knee,  not elsewhere classified  Acute pain of right knee  Muscle weakness (generalized)  Other abnormalities of gait and mobility  Localized edema     Problem List Patient Active Problem List   Diagnosis Date Noted   Closed patellar sleeve fracture, left, initial encounter 11/10/2019   Closed patellar sleeve fracture of right knee 10/28/2019   Finger fracture, right 10/28/2019   Conjunctivitis 11/07/2017   Hyperlipidemia 01/01/2013   Allergic rhinitis 12/12/2011   Breast mass, right 11/23/2010   WEIGHT GAIN 11/07/2009   Depression, major, single episode, in partial remission ([place]) 09/26/2006   Essential hypertension 09/26/2006    Silvestre Mesi 01/19/2020, 9:13 AM  [facility] Physical Therapy [address] [place], [place], [address] Phone: [phone]   Fax:  [phone]  Name: April Castillo MRN: [id number] Date of Birth: 01-28-1956

## 2020-01-21 ENCOUNTER — Encounter: Payer: Self-pay | Admitting: Physical Therapy

## 2020-01-21 ENCOUNTER — Ambulatory Visit (INDEPENDENT_AMBULATORY_CARE_PROVIDER_SITE_OTHER): Payer: Medicare Other | Admitting: Physical Therapy

## 2020-01-21 ENCOUNTER — Other Ambulatory Visit: Payer: Self-pay

## 2020-01-21 DIAGNOSIS — M6281 Muscle weakness (generalized): Secondary | ICD-10-CM

## 2020-01-21 DIAGNOSIS — M25661 Stiffness of right knee, not elsewhere classified: Secondary | ICD-10-CM

## 2020-01-21 DIAGNOSIS — R2689 Other abnormalities of gait and mobility: Secondary | ICD-10-CM

## 2020-01-21 DIAGNOSIS — M25561 Pain in right knee: Secondary | ICD-10-CM | POA: Diagnosis not present

## 2020-01-21 DIAGNOSIS — M25562 Pain in left knee: Secondary | ICD-10-CM

## 2020-01-21 DIAGNOSIS — R600 Localized edema: Secondary | ICD-10-CM

## 2020-01-21 NOTE — Therapy (Signed)
[facility] Physical Therapy [address] [place], [place], [address] Phone: [phone]   Fax:  [phone]  Physical Therapy Treatment  Patient Details  Name: CLARETTA KENDRA MRN: [id number] Date of Birth: 06/07/1955 Referring Provider (PT): Marlou Sa Tonna Corner, MD   Encounter Date: 01/21/2020   PT End of Session - 01/21/20 0840    Visit Number 7    Number of Visits 24    Date for PT Re-Evaluation 03/15/20    PT Start Time 0840    PT Stop Time 0927    PT Time Calculation (min) 47 min    Activity Tolerance Patient tolerated treatment well    Behavior During Therapy [facility] for tasks assessed/performed           Past Medical History:  Diagnosis Date  . Depression   . Heart murmur   . Hip fracture, right ([place]) 11/23/2010  . Hypertension   . Kidney stone   . KNEE PAIN, LEFT 11/07/2009  . Other and unspecified hyperlipidemia 01/01/2013  . PERIMENOPAUSAL SYNDROME 11/07/2009  . Rosacea 06/20/2006  . Tension headache 06/20/2006       . UTERINE FIBROID 06/20/2006    Past Surgical History:  Procedure Laterality Date  . CATARACT EXTRACTION W/ INTRAOCULAR LENS IMPLANT Left 08/03/13   Dr. Katy Fitch @ [facility] [place]  . EYE SURGERY Right    Cataract  . ORIF PATELLA Right 10/29/2019   Procedure: OPEN REDUCTION INTERNAL (ORIF) FIXATION PATELLA;  Surgeon: Meredith Pel, MD;  Location: [place];  Service: Orthopedics;  Laterality: Right;  . r hip fracture and repair  January 24, 2010    There were no vitals filed for this visit.   Subjective Assessment - 01/21/20 0841    Subjective No new exercises. She planted a shurb yesterday standing. No issues with using shovel.    Pertinent History Rt hip ORIF 9 years ago    Limitations Standing;Walking;House hold activities    Patient Stated Goals improve ROM and strength in her knees    Currently in Pain? No/denies    Pain Onset More than a month ago                             [facility] Adult PT Treatment/Exercise  - 01/21/20 0840      Self-Care   Self-Care ADL's    ADL's PT showed pt garden kneel bench on internet and instructed in use with extra pad on kneel position to decrease max knee flexion for prolonged.  Pt knelt on Airex foam using armrests of chair for 1 minute but <weight bearing on RLE. PT recommended doing at home on pillows or cushion and progressively increase tolerance to return to gardening. She can move UEs to elbows on seat bottom and play on phone or read to distract some what. Pt verbalized understanding.       Knee/Hip Exercises: Stretches   Active Hamstring Stretch Right;3 reps;20 seconds    Active Hamstring Stretch Limitations combo SLR & DF during rest on leg press    Quad Stretch Right;4 reps;30 seconds    Quad Stretch Limitations supine with leg over edge & sheet tied around ankle. Contract relax for 5 seconds with further stretch after relax    Knee: Self-Stretch Limitations --      Knee/Hip Exercises: Aerobic   Recumbent Bike seat 7 rocking first 5 min holding 5 sec, then moved to seat 8 and able to perform full revoutions 3  more minutes with some compensations      Knee/Hip Exercises: Machines for Strengthening   Cybex Leg Press performed bilat push at 81 lbs 3X10 then dropped to Rt leg only 31 lbs 3X10   verbal cues on holding flexion 2-3 sec & extension 2-3 sec     Knee/Hip Exercises: Standing   Lateral Step Up Right;Hand Hold: 1;Step Height: 6";10 reps;1 set    Lateral Step Up Limitations --    Forward Step Up Right;15 reps;Hand Hold: 1;Step Height: 6"    Step Down Right;10 reps;Step Height: 6";Hand Hold: 1   stepping down with left, only one intermittent touch   Step Down Limitations demo & verbal cues on PF LLE for normal stair pattern    Functional Squat Limitations squats with UE support 2 sets of 10    Other Standing Knee Exercises lateral walking at counter with green band around knees while maintaining mini squat up/down X 5 reps      Knee/Hip Exercises:  Seated   Long Arc Quad Right;3 sets;10 reps    Long Arc Quad [employer] --    Long Arc Quad Limitations cues to add DF with knee ext to facilitate stronger quad contraction.      Other Seated Knee/Hip Exercises Quad set 5 sec hold 10 reps 2sets during rest on leg press      Manual Therapy   Manual therapy comments supine Rt knee PROM, flexion mobs, patella mobs                    PT Short Term Goals - 01/12/20 1341      PT SHORT TERM GOAL #1   Title Pt will be I and compliant with inital HEP    Time 4    Period Weeks    Status Achieved      PT SHORT TERM GOAL #2   Title Pt will improve Rt knee flexion ROM to 90 deg    Time 4    Period Weeks    Status Achieved             PT Long Term Goals - 01/12/20 1341      PT LONG TERM GOAL #1   Title Pt will be I and compliant with final HEP    Time 12    Period Weeks    Status On-going      PT LONG TERM GOAL #2   Title Pt will improve Rt knee AROM 0-120 deg to improve function.    Baseline now 0-90    Time 12    Period Weeks    Status On-going      PT LONG TERM GOAL #3   Title Pt will improve bilat hip strength to 4+ and bilat knee strength to 5 MMT    Baseline now 4/5    Time 12    Period Weeks    Status On-going      PT LONG TERM GOAL #4   Title Pt will be able to ambulate community distances 1000 ft on even and uneven surfaces and negotiate stairs with less than 3/10 pain or difficulty.    Time 12    Period Weeks    Status On-going                 Plan - 01/21/20 0841    Clinical Impression Statement PT session worked on incresing knee flexion and quad strengthening open & closed chain.  PT instructed in kneeling with BUE  assist to begin gardening.    Examination-Activity Limitations Bend;Carry;Lift;Stand;Stairs;Squat;Locomotion Level;Transfers    Examination-Participation Restrictions Cleaning;Driving;Yard Work;Shop    Stability/Clinical Decision Making Evolving/Moderate complexity    Rehab  Potential Good    PT Frequency 2x / week    PT Duration 12 weeks    PT Treatment/Interventions ADLs/Self Care Home Management;Cryotherapy;Electrical Stimulation;Iontophoresis 4mg /ml Dexamethasone;Moist Heat;Ultrasound;Gait training;Stair training;Therapeutic activities;Therapeutic exercise;Neuromuscular re-education;Balance training;Manual techniques;Passive range of motion;Dry needling;Joint Manipulations;Vasopneumatic Device;Taping    PT Next Visit Plan now without ROM resticiton so progress knee flexion ROM and hip/knee strength as able.    PT Home Exercise Plan Access Code: [email or web address]    [email or web address] and Agree with Plan of Care Patient           Patient will benefit from skilled therapeutic intervention in order to improve the following deficits and impairments:  Abnormal gait, Decreased activity tolerance, Decreased endurance, Decreased mobility, Decreased strength, Decreased range of motion, Difficulty walking, Hypomobility, Increased edema, Impaired flexibility, Increased muscle spasms, Increased fascial restricitons, Impaired UE functional use, Pain  Visit Diagnosis: Stiffness of right knee, not elsewhere classified  Acute pain of right knee  Muscle weakness (generalized)  Other abnormalities of gait and mobility  Localized edema  Acute pain of left knee     Problem List Patient Active Problem List   Diagnosis Date Noted  . Closed patellar sleeve fracture, left, initial encounter 11/10/2019  . Closed patellar sleeve fracture of right knee 10/28/2019  . Finger fracture, right 10/28/2019  . Conjunctivitis 11/07/2017  . Hyperlipidemia 01/01/2013  . Allergic rhinitis 12/12/2011  . Breast mass, right 11/23/2010  . WEIGHT GAIN 11/07/2009  . Depression, major, single episode, in partial remission ([place]) 09/26/2006  . Essential hypertension 09/26/2006    Jamey Reas PT, DPT 01/21/2020, 10:05 AM  [facility] Physical Therapy [address] [place], [place],  [address] Phone: [phone]   Fax:  [phone]  Name: MAILEN NEWBORN MRN: [id number] Date of Birth: [birth year]

## 2020-01-26 ENCOUNTER — Other Ambulatory Visit: Payer: Self-pay

## 2020-01-26 ENCOUNTER — Ambulatory Visit: Payer: Medicare Other | Admitting: Physical Therapy

## 2020-01-26 DIAGNOSIS — M25562 Pain in left knee: Secondary | ICD-10-CM

## 2020-01-26 DIAGNOSIS — R2689 Other abnormalities of gait and mobility: Secondary | ICD-10-CM

## 2020-01-26 DIAGNOSIS — M25561 Pain in right knee: Secondary | ICD-10-CM

## 2020-01-26 DIAGNOSIS — R600 Localized edema: Secondary | ICD-10-CM

## 2020-01-26 DIAGNOSIS — M25661 Stiffness of right knee, not elsewhere classified: Secondary | ICD-10-CM | POA: Diagnosis not present

## 2020-01-26 DIAGNOSIS — M6281 Muscle weakness (generalized): Secondary | ICD-10-CM

## 2020-01-26 NOTE — Therapy (Signed)
[facility] Physical Therapy [address] [place], [place], [address] Phone: [phone]   Fax:  [phone]  Physical Therapy Treatment  Patient Details  Name: April Castillo MRN: [id number] Date of Birth: [birth year] Referring Provider (PT): Marlou Sa Tonna Corner, MD   Encounter Date: 01/26/2020   PT End of Session - 01/26/20 0919    Visit Number 8    Number of Visits 24    Date for PT Re-Evaluation 03/15/20    PT Start Time 0845    PT Stop Time 0930    PT Time Calculation (min) 45 min    Activity Tolerance Patient tolerated treatment well    Behavior During Therapy [facility] for tasks assessed/performed           Past Medical History:  Diagnosis Date  . Depression   . Heart murmur   . Hip fracture, right ([place]) 11/23/2010  . Hypertension   . Kidney stone   . KNEE PAIN, LEFT 11/07/2009  . Other and unspecified hyperlipidemia 01/01/2013  . PERIMENOPAUSAL SYNDROME 11/07/2009  . Rosacea 06/20/2006  . Tension headache 06/20/2006       . UTERINE FIBROID 06/20/2006    Past Surgical History:  Procedure Laterality Date  . CATARACT EXTRACTION W/ INTRAOCULAR LENS IMPLANT Left 08/03/13   Dr. Katy Fitch @ [facility] [place]  . EYE SURGERY Right    Cataract  . ORIF PATELLA Right 10/29/2019   Procedure: OPEN REDUCTION INTERNAL (ORIF) FIXATION PATELLA;  Surgeon: Meredith Pel, MD;  Location: [place];  Service: Orthopedics;  Laterality: Right;  . r hip fracture and repair  January 24, 2010    There were no vitals filed for this visit.   Subjective Assessment - 01/26/20 0909    Subjective knees feel stiff but not really painful    Pertinent History Rt hip ORIF 9 years ago    Limitations Standing;Walking;House hold activities    Patient Stated Goals improve ROM and strength in her knees    Pain Onset More than a month ago                             [facility] Adult PT Treatment/Exercise - 01/26/20 0001      Knee/Hip Exercises: Stretches   Sports administrator Right;3  reps;60 seconds    Quad Stretch Limitations prone with strap    Knee: Self-Stretch Limitations standing lunge stretch with Rt foot on 8 in step 10 sec X 10 reps, tailgate AAROM heelslides 10 sec X 10 reps,      Knee/Hip Exercises: Aerobic   Recumbent Bike seat 7 rocking first 3 min holding 5 sec, then moved to seat 8 and able to perform full revoutions 5 more minutes with some compensations      Knee/Hip Exercises: Machines for Strengthening   Cybex Knee Extension 5 lbs bilat push up with Rt leg focus on ecccentrics 3X10    Cybex Knee Flexion 25 lbs bilat pull 3X10    Cybex Leg Press performed bilat push at 81 lbs 3X10 then dropped to Rt leg only 37 lbs 3X10      Knee/Hip Exercises: Standing   Lateral Step Up Right;1 set;2 sets;10 reps;Hand Hold: 1;Step Height: 8"    Forward Step Up Right;2 sets;10 reps;Hand Hold: 1;Step Height: 8"    Functional Squat Limitations squats with UE support 2 sets of 10, 3 sec holds at bottom      Manual Therapy   Manual therapy comments supine  Rt knee PROM, flexion mobs, patella mobs                    PT Short Term Goals - 01/12/20 1341      PT SHORT TERM GOAL #1   Title Pt will be I and compliant with inital HEP    Time 4    Period Weeks    Status Achieved      PT SHORT TERM GOAL #2   Title Pt will improve Rt knee flexion ROM to 90 deg    Time 4    Period Weeks    Status Achieved             PT Long Term Goals - 01/12/20 1341      PT LONG TERM GOAL #1   Title Pt will be I and compliant with final HEP    Time 12    Period Weeks    Status On-going      PT LONG TERM GOAL #2   Title Pt will improve Rt knee AROM 0-120 deg to improve function.    Baseline now 0-90    Time 12    Period Weeks    Status On-going      PT LONG TERM GOAL #3   Title Pt will improve bilat hip strength to 4+ and bilat knee strength to 5 MMT    Baseline now 4/5    Time 12    Period Weeks    Status On-going      PT LONG TERM GOAL #4   Title Pt  will be able to ambulate community distances 1000 ft on even and uneven surfaces and negotiate stairs with less than 3/10 pain or difficulty.    Time 12    Period Weeks    Status On-going                 Plan - 01/26/20 0867    Clinical Impression Statement She was able to progress strengthening today with great tolerance. Continued to push ROM as tolerated. Continue POC, recommending 4 more weeks of PT    Examination-Activity Limitations Bend;Carry;Lift;Stand;Stairs;Squat;Locomotion Level;Transfers    Examination-Participation Restrictions Cleaning;Driving;Yard Work;Shop    Stability/Clinical Decision Making Evolving/Moderate complexity    Rehab Potential Good    PT Frequency 2x / week    PT Duration 12 weeks    PT Treatment/Interventions ADLs/Self Care Home Management;Cryotherapy;Electrical Stimulation;Iontophoresis 4mg /ml Dexamethasone;Moist Heat;Ultrasound;Gait training;Stair training;Therapeutic activities;Therapeutic exercise;Neuromuscular re-education;Balance training;Manual techniques;Passive range of motion;Dry needling;Joint Manipulations;Vasopneumatic Device;Taping    PT Next Visit Plan now without ROM resticiton so progress knee flexion ROM and hip/knee strength as able.    PT Home Exercise Plan Access Code: [email or web address]    [email or web address] and Agree with Plan of Care Patient           Patient will benefit from skilled therapeutic intervention in order to improve the following deficits and impairments:  Abnormal gait, Decreased activity tolerance, Decreased endurance, Decreased mobility, Decreased strength, Decreased range of motion, Difficulty walking, Hypomobility, Increased edema, Impaired flexibility, Increased muscle spasms, Increased fascial restricitons, Impaired UE functional use, Pain  Visit Diagnosis: Stiffness of right knee, not elsewhere classified  Acute pain of right knee  Muscle weakness (generalized)  Other abnormalities of gait and mobility  Localized  edema  Acute pain of left knee     Problem List Patient Active Problem List   Diagnosis Date Noted  . Closed patellar sleeve fracture, left, initial encounter 11/10/2019  . Closed patellar sleeve  fracture of right knee 10/28/2019  . Finger fracture, right 10/28/2019  . Conjunctivitis 11/07/2017  . Hyperlipidemia 01/01/2013  . Allergic rhinitis 12/12/2011  . Breast mass, right 11/23/2010  . WEIGHT GAIN 11/07/2009  . Depression, major, single episode, in partial remission ([place]) 09/26/2006  . Essential hypertension 09/26/2006    Silvestre Mesi 01/26/2020, 9:24 AM  [facility] Physical Therapy [address] [place], [place], [address] Phone: [phone]   Fax:  [phone]  Name: April Castillo MRN: [id number] Date of Birth: 1955/06/09

## 2020-01-28 ENCOUNTER — Other Ambulatory Visit: Payer: Self-pay

## 2020-01-28 ENCOUNTER — Ambulatory Visit: Payer: Medicare Other | Admitting: Physical Therapy

## 2020-01-28 DIAGNOSIS — M6281 Muscle weakness (generalized): Secondary | ICD-10-CM

## 2020-01-28 DIAGNOSIS — M25661 Stiffness of right knee, not elsewhere classified: Secondary | ICD-10-CM

## 2020-01-28 DIAGNOSIS — R600 Localized edema: Secondary | ICD-10-CM

## 2020-01-28 DIAGNOSIS — M25561 Pain in right knee: Secondary | ICD-10-CM

## 2020-01-28 DIAGNOSIS — R2689 Other abnormalities of gait and mobility: Secondary | ICD-10-CM

## 2020-01-28 NOTE — Therapy (Signed)
[facility] Physical Therapy [address] [place], [place], [address] Phone: [phone]   Fax:  [phone]  Physical Therapy Treatment  Patient Details  Name: April Castillo MRN: [id number] Date of Birth: Jan 18, 1956 Referring Provider (PT): Marlou Sa Tonna Corner, MD   Encounter Date: 01/28/2020   PT End of Session - 01/28/20 0904    Visit Number 9    Number of Visits 24    Date for PT Re-Evaluation 03/15/20    PT Start Time 0850    PT Stop Time 0930    PT Time Calculation (min) 40 min    Activity Tolerance Patient tolerated treatment well    Behavior During Therapy [facility] for tasks assessed/performed           Past Medical History:  Diagnosis Date  . Depression   . Heart murmur   . Hip fracture, right ([place]) 11/23/2010  . Hypertension   . Kidney stone   . KNEE PAIN, LEFT 11/07/2009  . Other and unspecified hyperlipidemia 01/01/2013  . PERIMENOPAUSAL SYNDROME 11/07/2009  . Rosacea 06/20/2006  . Tension headache 06/20/2006       . UTERINE FIBROID 06/20/2006    Past Surgical History:  Procedure Laterality Date  . CATARACT EXTRACTION W/ INTRAOCULAR LENS IMPLANT Left 08/03/13   Dr. Katy Fitch @ [facility] [place]  . EYE SURGERY Right    Cataract  . ORIF PATELLA Right 10/29/2019   Procedure: OPEN REDUCTION INTERNAL (ORIF) FIXATION PATELLA;  Surgeon: Meredith Pel, MD;  Location: [place];  Service: Orthopedics;  Laterality: Right;  . r hip fracture and repair  January 24, 2010    There were no vitals filed for this visit.   Subjective Assessment - 01/28/20 0903    Subjective knees feels more stiff this morning, woke up like that, not painful though    Pertinent History Rt hip ORIF 9 years ago    Limitations Standing;Walking;House hold activities    Patient Stated Goals improve ROM and strength in her knees    Pain Onset More than a month ago                             [facility] Adult PT Treatment/Exercise - 01/28/20 0001      Knee/Hip Exercises:  Stretches   Sports administrator --    Sports administrator Limitations --    Knee: Self-Stretch Limitations standing lunge stretch with Rt foot in chair 10 sec X 10 reps, kneeling in chair on foam pad lateral weight shifts 2 min, then sitting back on heels 10 sec X 10      Knee/Hip Exercises: Aerobic   Recumbent Bike seat 7 rocking first 2 min holding 5 sec, then moved to seat 8 and able to perform full revoutions 8 more minutes      Knee/Hip Exercises: Machines for Strengthening   Cybex Knee Extension 5 lbs bilat push up with Rt leg focus on ecccentrics 3X10    Cybex Knee Flexion 25 lbs bilat pull 3X10    Cybex Leg Press performed bilat push at 87 lbs 3X10 then dropped to Rt leg only 37 lbs 4X10      Knee/Hip Exercises: Seated   Sit to Sand 2 sets;10 reps;without UE support   Lt leg slightly in front     Manual Therapy   Manual therapy comments seated Rt knee PROM into fleixon with distraction and flexion mobs  PT Short Term Goals - 01/12/20 1341      PT SHORT TERM GOAL #1   Title Pt will be I and compliant with inital HEP    Time 4    Period Weeks    Status Achieved      PT SHORT TERM GOAL #2   Title Pt will improve Rt knee flexion ROM to 90 deg    Time 4    Period Weeks    Status Achieved             PT Long Term Goals - 01/12/20 1341      PT LONG TERM GOAL #1   Title Pt will be I and compliant with final HEP    Time 12    Period Weeks    Status On-going      PT LONG TERM GOAL #2   Title Pt will improve Rt knee AROM 0-120 deg to improve function.    Baseline now 0-90    Time 12    Period Weeks    Status On-going      PT LONG TERM GOAL #3   Title Pt will improve bilat hip strength to 4+ and bilat knee strength to 5 MMT    Baseline now 4/5    Time 12    Period Weeks    Status On-going      PT LONG TERM GOAL #4   Title Pt will be able to ambulate community distances 1000 ft on even and uneven surfaces and negotiate stairs with less than 3/10  pain or difficulty.    Time 12    Period Weeks    Status On-going                 Plan - 01/28/20 0944    Clinical Impression Statement Again able to increase resistance today with strengthening. Continues to limited with flexion ROM but this is slowly improving with PT. She will need progress note next visit    Examination-Activity Limitations Bend;Carry;Lift;Stand;Stairs;Squat;Locomotion Level;Transfers    Examination-Participation Restrictions Cleaning;Driving;Yard Work;Shop    Stability/Clinical Decision Making Evolving/Moderate complexity    Rehab Potential Good    PT Frequency 2x / week    PT Duration 12 weeks    PT Treatment/Interventions ADLs/Self Care Home Management;Cryotherapy;Electrical Stimulation;Iontophoresis 4mg /ml Dexamethasone;Moist Heat;Ultrasound;Gait training;Stair training;Therapeutic activities;Therapeutic exercise;Neuromuscular re-education;Balance training;Manual techniques;Passive range of motion;Dry needling;Joint Manipulations;Vasopneumatic Device;Taping    PT Next Visit Plan progress knee flexion ROM and hip/knee strength as able.    PT Home Exercise Plan Access Code: [email or web address]    [email or web address] and Agree with Plan of Care Patient           Patient will benefit from skilled therapeutic intervention in order to improve the following deficits and impairments:  Abnormal gait, Decreased activity tolerance, Decreased endurance, Decreased mobility, Decreased strength, Decreased range of motion, Difficulty walking, Hypomobility, Increased edema, Impaired flexibility, Increased muscle spasms, Increased fascial restricitons, Impaired UE functional use, Pain  Visit Diagnosis: Stiffness of right knee, not elsewhere classified  Acute pain of right knee  Muscle weakness (generalized)  Other abnormalities of gait and mobility  Localized edema     Problem List Patient Active Problem List   Diagnosis Date Noted  . Closed patellar sleeve fracture, left, initial  encounter 11/10/2019  . Closed patellar sleeve fracture of right knee 10/28/2019  . Finger fracture, right 10/28/2019  . Conjunctivitis 11/07/2017  . Hyperlipidemia 01/01/2013  . Allergic rhinitis 12/12/2011  . Breast mass, right 11/23/2010  . WEIGHT  GAIN 11/07/2009  . Depression, major, single episode, in partial remission ([place]) 09/26/2006  . Essential hypertension 09/26/2006    Silvestre Mesi 01/28/2020, 9:48 AM  [facility] Physical Therapy [address] [place], [place], [address] Phone: [phone]   Fax:  [phone]  Name: BRITTINI BRUBECK MRN: [id number] Date of Birth: October 08, 1955

## 2020-02-08 ENCOUNTER — Ambulatory Visit: Payer: Medicare Other | Admitting: Physical Therapy

## 2020-02-08 ENCOUNTER — Other Ambulatory Visit: Payer: Self-pay

## 2020-02-08 DIAGNOSIS — M25562 Pain in left knee: Secondary | ICD-10-CM

## 2020-02-08 DIAGNOSIS — R600 Localized edema: Secondary | ICD-10-CM

## 2020-02-08 DIAGNOSIS — M25661 Stiffness of right knee, not elsewhere classified: Secondary | ICD-10-CM | POA: Diagnosis not present

## 2020-02-08 DIAGNOSIS — R2689 Other abnormalities of gait and mobility: Secondary | ICD-10-CM

## 2020-02-08 DIAGNOSIS — M25561 Pain in right knee: Secondary | ICD-10-CM | POA: Diagnosis not present

## 2020-02-08 DIAGNOSIS — M6281 Muscle weakness (generalized): Secondary | ICD-10-CM | POA: Diagnosis not present

## 2020-02-08 NOTE — Therapy (Signed)
[facility] Physical Therapy [address] [place], [place], [address] Phone: [phone]   Fax:  [phone]  Physical Therapy Treatment/Progress note Progress Note reporting period 12/22/19 to 02/08/20  See below for objective and subjective measurements relating to patients progress with PT.   Patient Details  Name: April Castillo MRN: [id number] Date of Birth: [birth year] Referring Provider (PT): Marlou Sa Tonna Corner, MD   Encounter Date: 02/08/2020   PT End of Session - 02/08/20 1510    Visit Number 10    Number of Visits 24    Date for PT Re-Evaluation 03/15/20    PT Start Time 1500    PT Stop Time 1545    PT Time Calculation (min) 45 min    Activity Tolerance Patient tolerated treatment well    Behavior During Therapy [facility] for tasks assessed/performed           Past Medical History:  Diagnosis Date  . Depression   . Heart murmur   . Hip fracture, right ([place]) 11/23/2010  . Hypertension   . Kidney stone   . KNEE PAIN, LEFT 11/07/2009  . Other and unspecified hyperlipidemia 01/01/2013  . PERIMENOPAUSAL SYNDROME 11/07/2009  . Rosacea 06/20/2006  . Tension headache 06/20/2006       . UTERINE FIBROID 06/20/2006    Past Surgical History:  Procedure Laterality Date  . CATARACT EXTRACTION W/ INTRAOCULAR LENS IMPLANT Left 08/03/13   Dr. Katy Fitch @ [facility] [place]  . EYE SURGERY Right    Cataract  . ORIF PATELLA Right 10/29/2019   Procedure: OPEN REDUCTION INTERNAL (ORIF) FIXATION PATELLA;  Surgeon: Meredith Pel, MD;  Location: [place];  Service: Orthopedics;  Laterality: Right;  . r hip fracture and repair  January 24, 2010    There were no vitals filed for this visit.   Subjective Assessment - 02/08/20 1508    Subjective not much pain, feels she is 80% back to normal since starting PT, feels she still is lacking ROM and difficulty going down stairs    Pertinent History Rt hip ORIF 9 years ago    Limitations Standing;Walking;House hold activities     Patient Stated Goals improve ROM and strength in her knees    Pain Onset More than a month ago              [facility] PT Assessment - 02/08/20 0001      Assessment   Medical Diagnosis Closed sleeve fracture of right and left patella with routine healing, subsequent encounter with Rt patella ORIF 10/29/19. Rt finger fracture    Referring Provider (PT) Marlou Sa Tonna Corner, MD    Onset Date/Surgical Date 10/29/19      AROM   Right Knee Extension 0    Right Knee Flexion 110      PROM   Right Knee Extension 0    Right Knee Flexion 117      Strength   Right Hip Flexion 4+/5    Right Hip ABduction 4+/5    Right Knee Flexion 5/5    Right Knee Extension 4+/5             [facility] Adult PT Treatment/Exercise - 02/08/20 0001      Knee/Hip Exercises: Stretches   Sports administrator Right;3 reps;60 seconds    Quad Stretch Limitations prone with strap    Knee: Self-Stretch Limitations standing lunge stretch with Rt foot in chair 10 sec X 10 reps, child pose in quadriped on mat tablesitting back on heels 20 sec  X 5      Knee/Hip Exercises: Aerobic   Recumbent Bike seat 7 now able to perform full revoutions 10 minutes      Knee/Hip Exercises: Machines for Strengthening   Cybex Knee Extension 10 lbs bilat push up with Rt leg focus on ecccentrics 3X10    Cybex Knee Flexion 25 lbs bilat pull 3X10    Cybex Leg Press performed bilat push at 87 lbs 3X10 then dropped to Rt leg only 43 lbs 3X10      Knee/Hip Exercises: Standing   Lateral Step Up Right;10 reps;Step Height: 6";Hand Hold: 1    Forward Step Up Right;10 reps;Hand Hold: 1;Step Height: 8"    Step Down Right;10 reps;Hand Hold: 1;Step Height: 8"                    PT Short Term Goals - 02/08/20 1525      PT SHORT TERM GOAL #1   Title Pt will be I and compliant with inital HEP    Time 4    Period Weeks    Status Achieved      PT SHORT TERM GOAL #2   Title Pt will improve Rt knee flexion ROM to 90 deg    Time 4    Period  Weeks    Status Achieved             PT Long Term Goals - 02/08/20 1525      PT LONG TERM GOAL #1   Title Pt will be I and compliant with final HEP    Time 12    Period Weeks    Status On-going      PT LONG TERM GOAL #2   Title Pt will improve Rt knee AROM 0-120 deg to improve function.    Baseline now 0-    Time 12    Period Weeks    Status On-going      PT LONG TERM GOAL #3   Title Pt will improve bilat hip strength to 4+ and bilat knee strength to 5 MMT    Baseline now 4+    Time 12    Period Weeks    Status Achieved      PT LONG TERM GOAL #4   Title Pt will be able to ambulate community distances 1000 ft on even and uneven surfaces and negotiate stairs with less than 3/10 pain or difficulty.    Baseline still with difficulty going down stairs.    Time 12    Period Weeks    Status On-going                 Plan - 02/08/20 1606    Clinical Impression Statement Progress note shows improvments in knee ROM and strength with PT along with decreased overall pain. She does still lack Rt knee flexion ROM and quad strength and has difficulty with descending stairs. She will continue to benefti from PT to address these deficits.    Examination-Activity Limitations Bend;Carry;Lift;Stand;Stairs;Squat;Locomotion Level;Transfers    Examination-Participation Restrictions Cleaning;Driving;Yard Work;Shop    Stability/Clinical Decision Making Evolving/Moderate complexity    Rehab Potential Good    PT Frequency 2x / week    PT Duration 12 weeks    PT Treatment/Interventions ADLs/Self Care Home Management;Cryotherapy;Electrical Stimulation;Iontophoresis 4mg /ml Dexamethasone;Moist Heat;Ultrasound;Gait training;Stair training;Therapeutic activities;Therapeutic exercise;Neuromuscular re-education;Balance training;Manual techniques;Passive range of motion;Dry needling;Joint Manipulations;Vasopneumatic Device;Taping    PT Next Visit Plan progress knee flexion ROM and hip/knee strength  as able.  PT Home Exercise Plan Access Code: [email or web address]    [email or web address] and Agree with Plan of Care Patient           Patient will benefit from skilled therapeutic intervention in order to improve the following deficits and impairments:  Abnormal gait, Decreased activity tolerance, Decreased endurance, Decreased mobility, Decreased strength, Decreased range of motion, Difficulty walking, Hypomobility, Increased edema, Impaired flexibility, Increased muscle spasms, Increased fascial restricitons, Impaired UE functional use, Pain  Visit Diagnosis: Stiffness of right knee, not elsewhere classified  Acute pain of right knee  Muscle weakness (generalized)  Other abnormalities of gait and mobility  Localized edema  Acute pain of left knee     Problem List Patient Active Problem List   Diagnosis Date Noted  . Closed patellar sleeve fracture, left, initial encounter 11/10/2019  . Closed patellar sleeve fracture of right knee 10/28/2019  . Finger fracture, right 10/28/2019  . Conjunctivitis 11/07/2017  . Hyperlipidemia 01/01/2013  . Allergic rhinitis 12/12/2011  . Breast mass, right 11/23/2010  . WEIGHT GAIN 11/07/2009  . Depression, major, single episode, in partial remission ([place]) 09/26/2006  . Essential hypertension 09/26/2006    Silvestre Mesi 02/08/2020, 4:08 PM  [facility] Physical Therapy [address] [place], [place], [address] Phone: [phone]   Fax:  [phone]  Name: RAHEL CARLTON MRN: [id number] Date of Birth: 1956/02/20

## 2020-02-10 ENCOUNTER — Other Ambulatory Visit: Payer: Self-pay

## 2020-02-10 ENCOUNTER — Encounter: Payer: Self-pay | Admitting: Physical Therapy

## 2020-02-10 ENCOUNTER — Ambulatory Visit: Payer: Medicare Other | Admitting: Physical Therapy

## 2020-02-10 DIAGNOSIS — R2689 Other abnormalities of gait and mobility: Secondary | ICD-10-CM

## 2020-02-10 DIAGNOSIS — M6281 Muscle weakness (generalized): Secondary | ICD-10-CM | POA: Diagnosis not present

## 2020-02-10 DIAGNOSIS — M25661 Stiffness of right knee, not elsewhere classified: Secondary | ICD-10-CM | POA: Diagnosis not present

## 2020-02-10 DIAGNOSIS — R600 Localized edema: Secondary | ICD-10-CM

## 2020-02-10 DIAGNOSIS — M25561 Pain in right knee: Secondary | ICD-10-CM | POA: Diagnosis not present

## 2020-02-10 NOTE — Therapy (Signed)
[facility] Physical Therapy [address] [place], [place], [address] Phone: [phone]   Fax:  [phone]  Physical Therapy Treatment  Patient Details  Name: April Castillo MRN: [id number] Date of Birth: [birth year] Referring Provider (PT): Marlou Sa Tonna Corner, MD   Encounter Date: 02/10/2020   PT End of Session - 02/10/20 1327    Visit Number 11    Number of Visits 24    Date for PT Re-Evaluation 03/15/20    PT Start Time 1300    PT Stop Time 1340    PT Time Calculation (min) 40 min    Activity Tolerance Patient tolerated treatment well    Behavior During Therapy [facility] for tasks assessed/performed           Past Medical History:  Diagnosis Date  . Depression   . Heart murmur   . Hip fracture, right ([place]) 11/23/2010  . Hypertension   . Kidney stone   . KNEE PAIN, LEFT 11/07/2009  . Other and unspecified hyperlipidemia 01/01/2013  . PERIMENOPAUSAL SYNDROME 11/07/2009  . Rosacea 06/20/2006  . Tension headache 06/20/2006       . UTERINE FIBROID 06/20/2006    Past Surgical History:  Procedure Laterality Date  . CATARACT EXTRACTION W/ INTRAOCULAR LENS IMPLANT Left 08/03/13   Dr. Katy Fitch @ [facility] [place]  . EYE SURGERY Right    Cataract  . ORIF PATELLA Right 10/29/2019   Procedure: OPEN REDUCTION INTERNAL (ORIF) FIXATION PATELLA;  Surgeon: Meredith Pel, MD;  Location: [place];  Service: Orthopedics;  Laterality: Right;  . r hip fracture and repair  January 24, 2010    There were no vitals filed for this visit.   Subjective Assessment - 02/10/20 1314    Subjective relays not much pain, overall a little less stiffness in her knee today.    Pertinent History Rt hip ORIF 9 years ago    Limitations Standing;Walking;House hold activities    Patient Stated Goals improve ROM and strength in her knees    Pain Onset More than a month ago                             [facility] Adult PT Treatment/Exercise - 02/10/20 0001      Knee/Hip Exercises:  Stretches   Sports administrator Right;3 reps;60 seconds    Quad Stretch Limitations prone with strap    Knee: Self-Stretch Limitations standing lunge stretch with Rt foot in chair 10 sec X 10 reps, child pose in quadriped on mat tablesitting back on heels 20 sec X 5      Knee/Hip Exercises: Aerobic   Recumbent Bike seate 7 10 min L3      Knee/Hip Exercises: Machines for Strengthening   Cybex Knee Extension 10 lbs bilat push up with Rt leg focus on ecccentrics 3X10    Cybex Knee Flexion 25 lbs bilat pull 3X10    Cybex Leg Press performed bilat push at 100 lbs 3X10 then dropped to Rt leg only 50 lbs 3X10      Knee/Hip Exercises: Standing   Lateral Step Up Right;Step Height: 6";15 reps    Forward Step Up Right;Hand Hold: 0;15 reps;Step Height: 6"    Step Down Right;15 reps;Step Height: 6";Hand Hold: 1    Other Standing Knee Exercises with UE support mini lunges at counter X 10 reps bilat      Manual Therapy   Manual therapy comments prone manual knee flexion PROM and quad  stretching MET contract-relax. supine knee flextion PROM and flexion mobs                    PT Short Term Goals - 02/08/20 1525      PT SHORT TERM GOAL #1   Title Pt will be I and compliant with inital HEP    Time 4    Period Weeks    Status Achieved      PT SHORT TERM GOAL #2   Title Pt will improve Rt knee flexion ROM to 90 deg    Time 4    Period Weeks    Status Achieved             PT Long Term Goals - 02/08/20 1525      PT LONG TERM GOAL #1   Title Pt will be I and compliant with final HEP    Time 12    Period Weeks    Status On-going      PT LONG TERM GOAL #2   Title Pt will improve Rt knee AROM 0-120 deg to improve function.    Baseline now 0-    Time 12    Period Weeks    Status On-going      PT LONG TERM GOAL #3   Title Pt will improve bilat hip strength to 4+ and bilat knee strength to 5 MMT    Baseline now 4+    Time 12    Period Weeks    Status Achieved      PT LONG TERM  GOAL #4   Title Pt will be able to ambulate community distances 1000 ft on even and uneven surfaces and negotiate stairs with less than 3/10 pain or difficulty.    Baseline still with difficulty going down stairs.    Time 12    Period Weeks    Status On-going                 Plan - 02/10/20 1328    Clinical Impression Statement She was able to improve strengthening today with improved resistance and or reps today with good tolerance and without complaints of pain. PT will continue to progress ROM and strength as able.    Examination-Activity Limitations Bend;Carry;Lift;Stand;Stairs;Squat;Locomotion Level;Transfers    Examination-Participation Restrictions Cleaning;Driving;Yard Work;Shop    Stability/Clinical Decision Making Evolving/Moderate complexity    Rehab Potential Good    PT Frequency 2x / week    PT Duration 12 weeks    PT Treatment/Interventions ADLs/Self Care Home Management;Cryotherapy;Electrical Stimulation;Iontophoresis 52m/ml Dexamethasone;Moist Heat;Ultrasound;Gait training;Stair training;Therapeutic activities;Therapeutic exercise;Neuromuscular re-education;Balance training;Manual techniques;Passive range of motion;Dry needling;Joint Manipulations;Vasopneumatic Device;Taping    PT Next Visit Plan progress knee flexion ROM and hip/knee strength as able.    PT Home Exercise Plan Access Code: [email or web address]    [email or web address] Agree with Plan of Care Patient           Patient will benefit from skilled therapeutic intervention in order to improve the following deficits and impairments:  Abnormal gait, Decreased activity tolerance, Decreased endurance, Decreased mobility, Decreased strength, Decreased range of motion, Difficulty walking, Hypomobility, Increased edema, Impaired flexibility, Increased muscle spasms, Increased fascial restricitons, Impaired UE functional use, Pain  Visit Diagnosis: Stiffness of right knee, not elsewhere classified  Acute pain of right  knee  Muscle weakness (generalized)  Other abnormalities of gait and mobility  Localized edema     Problem List Patient Active Problem List   Diagnosis Date Noted  . Closed patellar sleeve  fracture, left, initial encounter 11/10/2019  . Closed patellar sleeve fracture of right knee 10/28/2019  . Finger fracture, right 10/28/2019  . Conjunctivitis 11/07/2017  . Hyperlipidemia 01/01/2013  . Allergic rhinitis 12/12/2011  . Breast mass, right 11/23/2010  . WEIGHT GAIN 11/07/2009  . Depression, major, single episode, in partial remission ([place]) 09/26/2006  . Essential hypertension 09/26/2006    Silvestre Mesi 02/10/2020, 1:39 PM  [facility] Physical Therapy [address] [place], [place], [address] Phone: [phone]   Fax:  [phone]  Name: April Castillo MRN: [id number] Date of Birth: 1955/12/02

## 2020-02-15 ENCOUNTER — Other Ambulatory Visit: Payer: Self-pay

## 2020-02-15 ENCOUNTER — Ambulatory Visit: Payer: Medicare Other | Admitting: Physical Therapy

## 2020-02-15 DIAGNOSIS — M6281 Muscle weakness (generalized): Secondary | ICD-10-CM | POA: Diagnosis not present

## 2020-02-15 DIAGNOSIS — M25561 Pain in right knee: Secondary | ICD-10-CM | POA: Diagnosis not present

## 2020-02-15 DIAGNOSIS — R600 Localized edema: Secondary | ICD-10-CM

## 2020-02-15 DIAGNOSIS — M25661 Stiffness of right knee, not elsewhere classified: Secondary | ICD-10-CM | POA: Diagnosis not present

## 2020-02-15 DIAGNOSIS — M25562 Pain in left knee: Secondary | ICD-10-CM

## 2020-02-15 DIAGNOSIS — R2689 Other abnormalities of gait and mobility: Secondary | ICD-10-CM

## 2020-02-15 NOTE — Therapy (Signed)
[facility] Physical Therapy [address] [place], [place], [address] Phone: [phone]   Fax:  [phone]  Physical Therapy Treatment  Patient Details  Name: April Castillo MRN: [id number] Date of Birth: [birth year] Referring Provider (PT): Marlou Sa Tonna Corner, MD   Encounter Date: 02/15/2020   PT End of Session - 02/15/20 1639    Visit Number 12    Number of Visits 24    Date for PT Re-Evaluation 03/15/20    PT Start Time 1600    PT Stop Time 1644    PT Time Calculation (min) 44 min    Activity Tolerance Patient tolerated treatment well    Behavior During Therapy [facility] for tasks assessed/performed           Past Medical History:  Diagnosis Date  . Depression   . Heart murmur   . Hip fracture, right ([place]) 11/23/2010  . Hypertension   . Kidney stone   . KNEE PAIN, LEFT 11/07/2009  . Other and unspecified hyperlipidemia 01/01/2013  . PERIMENOPAUSAL SYNDROME 11/07/2009  . Rosacea 06/20/2006  . Tension headache 06/20/2006       . UTERINE FIBROID 06/20/2006    Past Surgical History:  Procedure Laterality Date  . CATARACT EXTRACTION W/ INTRAOCULAR LENS IMPLANT Left 08/03/13   Dr. Katy Fitch @ [facility] [place]  . EYE SURGERY Right    Cataract  . ORIF PATELLA Right 10/29/2019   Procedure: OPEN REDUCTION INTERNAL (ORIF) FIXATION PATELLA;  Surgeon: Meredith Pel, MD;  Location: [place];  Service: Orthopedics;  Laterality: Right;  . r hip fracture and repair  January 24, 2010    There were no vitals filed for this visit.   Subjective Assessment - 02/15/20 1636    Subjective knee is still very stiff but not much pain    Pertinent History Rt hip ORIF 9 years ago    Limitations Standing;Walking;House hold activities    Patient Stated Goals improve ROM and strength in her knees    Currently in Pain? No/denies    Pain Onset More than a month ago              [facility] PT Assessment - 02/15/20 0001      Assessment   Medical Diagnosis Closed sleeve fracture of right  and left patella with routine healing, subsequent encounter with Rt patella ORIF 10/29/19. Rt finger fracture    Referring Provider (PT) Marlou Sa Tonna Corner, MD    Onset Date/Surgical Date 10/29/19      AROM   Right Knee Extension 0    Right Knee Flexion 115      PROM   Right Knee Extension 0    Right Knee Flexion 121             [facility] Adult PT Treatment/Exercise - 02/15/20 0001      Knee/Hip Exercises: Stretches   Sports administrator Right;3 reps;60 seconds    Quad Stretch Limitations prone with strap    Knee: Self-Stretch Limitations standing lunge stretch with Rt foot in chair 10 sec X 10 reps, child pose in quadriped on mat tablesitting back on heels 20 sec X 5      Knee/Hip Exercises: Aerobic   Recumbent Bike seat 7 10 min L3      Knee/Hip Exercises: Machines for Strengthening   Cybex Knee Extension 10 lbs bilat push up with Rt leg focus on ecccentrics 3X10    Cybex Knee Flexion 25 lbs bilat pull 3X10    Cybex Leg Press performed  bilat push at 106 lbs 3X10 then dropped to Rt leg only 56 lbs 3X10      Knee/Hip Exercises: Standing   Lateral Step Up Right;10 reps;Step Height: 8"    Forward Step Up Right;2 sets;10 reps;Step Height: 8";Hand Hold: 1   with alt leg march   Step Down Right;Hand Hold: 1;2 sets;10 reps;Step Height: 8"    Other Standing Knee Exercises with UE support mini lunges at counter X 15 reps bilat      Manual Therapy   Manual therapy comments  supine knee flexion PROM and flexion mobs                    PT Short Term Goals - 02/08/20 1525      PT SHORT TERM GOAL #1   Title Pt will be I and compliant with inital HEP    Time 4    Period Weeks    Status Achieved      PT SHORT TERM GOAL #2   Title Pt will improve Rt knee flexion ROM to 90 deg    Time 4    Period Weeks    Status Achieved             PT Long Term Goals - 02/15/20 1647      PT LONG TERM GOAL #1   Title Pt will be I and compliant with final HEP    Time 12    Period Weeks      Status On-going      PT LONG TERM GOAL #2   Title Pt will improve Rt knee AROM 0-120 deg to improve function.    Baseline 0-110 AROM can get to 121 PROM    Time 12    Period Weeks    Status On-going      PT LONG TERM GOAL #3   Title Pt will improve bilat hip strength to 4+ and bilat knee strength to 5 MMT    Baseline now 4+    Time 12    Period Weeks    Status Achieved      PT LONG TERM GOAL #4   Title Pt will be able to ambulate community distances 1000 ft on even and uneven surfaces and negotiate stairs with less than 3/10 pain or difficulty.    Baseline still with difficulty going down stairs.    Time 12    Period Weeks    Status On-going                 Plan - 02/15/20 1640    Clinical Impression Statement Pt able to progress step ht today and resistance on leg press with good tolerance. ROM measurements continue to improve and she is progressing well with PT.    Examination-Activity Limitations Bend;Carry;Lift;Stand;Stairs;Squat;Locomotion Level;Transfers    Examination-Participation Restrictions Cleaning;Driving;Yard Work;Shop    Stability/Clinical Decision Making Evolving/Moderate complexity    Rehab Potential Good    PT Frequency 2x / week    PT Duration 12 weeks    PT Treatment/Interventions ADLs/Self Care Home Management;Cryotherapy;Electrical Stimulation;Iontophoresis 4mg /ml Dexamethasone;Moist Heat;Ultrasound;Gait training;Stair training;Therapeutic activities;Therapeutic exercise;Neuromuscular re-education;Balance training;Manual techniques;Passive range of motion;Dry needling;Joint Manipulations;Vasopneumatic Device;Taping    PT Next Visit Plan progress knee flexion ROM and hip/knee strength as able.    PT Home Exercise Plan Access Code: [email or web address]    [email or web address] and Agree with Plan of Care Patient           Patient will benefit from skilled therapeutic intervention in order to  improve the following deficits and impairments:  Abnormal gait, Decreased  activity tolerance, Decreased endurance, Decreased mobility, Decreased strength, Decreased range of motion, Difficulty walking, Hypomobility, Increased edema, Impaired flexibility, Increased muscle spasms, Increased fascial restricitons, Impaired UE functional use, Pain  Visit Diagnosis: Stiffness of right knee, not elsewhere classified  Acute pain of right knee  Muscle weakness (generalized)  Localized edema  Other abnormalities of gait and mobility  Acute pain of left knee     Problem List Patient Active Problem List   Diagnosis Date Noted  . Closed patellar sleeve fracture, left, initial encounter 11/10/2019  . Closed patellar sleeve fracture of right knee 10/28/2019  . Finger fracture, right 10/28/2019  . Conjunctivitis 11/07/2017  . Hyperlipidemia 01/01/2013  . Allergic rhinitis 12/12/2011  . Breast mass, right 11/23/2010  . WEIGHT GAIN 11/07/2009  . Depression, major, single episode, in partial remission ([place]) 09/26/2006  . Essential hypertension 09/26/2006    Silvestre Mesi 02/15/2020, 4:48 PM  [facility] Physical Therapy [address] [place], [place], [address] Phone: [phone]   Fax:  [phone]  Name: April Castillo MRN: [id number] Date of Birth: 1955-06-02

## 2020-02-17 ENCOUNTER — Ambulatory Visit: Payer: Medicare Other | Admitting: Physical Therapy

## 2020-02-17 ENCOUNTER — Other Ambulatory Visit: Payer: Self-pay

## 2020-02-17 DIAGNOSIS — M25562 Pain in left knee: Secondary | ICD-10-CM

## 2020-02-17 DIAGNOSIS — M25561 Pain in right knee: Secondary | ICD-10-CM | POA: Diagnosis not present

## 2020-02-17 DIAGNOSIS — M6281 Muscle weakness (generalized): Secondary | ICD-10-CM | POA: Diagnosis not present

## 2020-02-17 DIAGNOSIS — R600 Localized edema: Secondary | ICD-10-CM

## 2020-02-17 DIAGNOSIS — R2689 Other abnormalities of gait and mobility: Secondary | ICD-10-CM

## 2020-02-17 DIAGNOSIS — M25661 Stiffness of right knee, not elsewhere classified: Secondary | ICD-10-CM | POA: Diagnosis not present

## 2020-02-17 NOTE — Therapy (Signed)
[facility] Therapy [address] [place], [place], [address] Phone: [phone]   Fax:  [phone]  Physical Therapy Treatment  Patient Details  Name: April Castillo MRN: [id number] Date of Birth: Aug 10, 1955 Referring Provider (PT): Marlou Sa Tonna Corner, MD   Encounter Date: 02/17/2020   PT End of Session - 02/17/20 1128    Visit Number 13    Number of Visits 24    Date for PT Re-Evaluation 03/15/20    PT Start Time 1100    PT Stop Time 1143    PT Time Calculation (min) 43 min    Activity Tolerance Patient tolerated treatment well    Behavior During Therapy [facility] for tasks assessed/performed           Past Medical History:  Diagnosis Date   Depression    Heart murmur    Hip fracture, right ([place]) 11/23/2010   Hypertension    Kidney stone    KNEE PAIN, LEFT 11/07/2009   Other and unspecified hyperlipidemia 01/01/2013   PERIMENOPAUSAL SYNDROME 11/07/2009   Rosacea 06/20/2006   Tension headache 06/20/2006        UTERINE FIBROID 06/20/2006    Past Surgical History:  Procedure Laterality Date   CATARACT EXTRACTION W/ INTRAOCULAR LENS IMPLANT Left 08/03/13   Dr. Katy Fitch @ [place] [facility] [place] Right    Cataract   ORIF PATELLA Right 10/29/2019   Procedure: OPEN REDUCTION INTERNAL (ORIF) FIXATION PATELLA;  Surgeon: Meredith Pel, MD;  Location: [place];  Service: Orthopedics;  Laterality: Right;   r hip fracture and repair  January 24, 2010    There were no vitals filed for this visit.   Subjective Assessment - 02/17/20 1128    Subjective my knee feels ok today, no pain just stiffness    Pertinent History Rt hip ORIF 9 years ago    Limitations Standing;Walking;House hold activities    Patient Stated Goals improve ROM and strength in her knees    Pain Onset More than a month ago                             [facility] Adult PT Treatment/Exercise - 02/17/20 0001      Knee/Hip Exercises: Stretches   Sports administrator  Right;3 reps;60 seconds    Quad Stretch Limitations prone with strap    Knee: Self-Stretch Limitations standing lunge stretch with Rt foot in chair 10 sec X 10 reps, child pose in quadriped on mat tablesitting back on heels 20 sec X 5      Knee/Hip Exercises: Aerobic   Recumbent Bike seat 6 10 min L5      Knee/Hip Exercises: Machines for Strengthening   Cybex Knee Extension 10 lbs bilat push up with Rt leg focus on ecccentrics 3X15    Cybex Knee Flexion 25 lbs bilat pull 3X15    Cybex Leg Press performed bilat push at 112 lbs 3X10 then dropped to Rt leg only 56 lbs 3X10      Knee/Hip Exercises: Standing   Lateral Step Up Right;10 reps;Step Height: 8";2 sets    Forward Step Up Right;2 sets;10 reps;Step Height: 8";Hand Hold: 1    Forward Step Up Limitations with alt leg march    Step Down Right;Hand Hold: 1;2 sets;10 reps;Step Height: 8"    Other Standing Knee Exercises with UE support mini lunges at counter X 15 reps bilat      Manual Therapy  Manual therapy comments  supine knee flexion PROM and flexion mobs                    PT Short Term Goals - 02/08/20 1525      PT SHORT TERM GOAL #1   Title Pt will be I and compliant with inital HEP    Time 4    Period Weeks    Status Achieved      PT SHORT TERM GOAL #2   Title Pt will improve Rt knee flexion ROM to 90 deg    Time 4    Period Weeks    Status Achieved             PT Long Term Goals - 02/15/20 1647      PT LONG TERM GOAL #1   Title Pt will be I and compliant with final HEP    Time 12    Period Weeks    Status On-going      PT LONG TERM GOAL #2   Title Pt will improve Rt knee AROM 0-120 deg to improve function.    Baseline 0-110 AROM can get to 121 PROM    Time 12    Period Weeks    Status On-going      PT LONG TERM GOAL #3   Title Pt will improve bilat hip strength to 4+ and bilat knee strength to 5 MMT    Baseline now 4+    Time 12    Period Weeks    Status Achieved      PT LONG TERM  GOAL #4   Title Pt will be able to ambulate community distances 1000 ft on even and uneven surfaces and negotiate stairs with less than 3/10 pain or difficulty.    Baseline still with difficulty going down stairs.    Time 12    Period Weeks    Status On-going                 Plan - 02/17/20 1130    Clinical Impression Statement She continues to improve well with PT in all areas. Still missing mild stregth and ROM and will continue to benefit from PT.    Examination-Activity Limitations Bend;Carry;Lift;Stand;Stairs;Squat;Locomotion Level;Transfers    Examination-Participation Restrictions Cleaning;Driving;Yard Work;Shop    Stability/Clinical Decision Making Evolving/Moderate complexity    Rehab Potential Good    PT Frequency 2x / week    PT Duration 12 weeks    PT Treatment/Interventions ADLs/Self Care Home Management;Cryotherapy;Electrical Stimulation;Iontophoresis 4mg /ml Dexamethasone;Moist Heat;Ultrasound;Gait training;Stair training;Therapeutic activities;Therapeutic exercise;Neuromuscular re-education;Balance training;Manual techniques;Passive range of motion;Dry needling;Joint Manipulations;Vasopneumatic Device;Taping    PT Next Visit Plan progress knee flexion ROM and hip/knee strength as able.    PT Home Exercise Plan Access Code: [email or web address]    [email or web address] and Agree with Plan of Care Patient           Patient will benefit from skilled therapeutic intervention in order to improve the following deficits and impairments:  Abnormal gait, Decreased activity tolerance, Decreased endurance, Decreased mobility, Decreased strength, Decreased range of motion, Difficulty walking, Hypomobility, Increased edema, Impaired flexibility, Increased muscle spasms, Increased fascial restricitons, Impaired UE functional use, Pain  Visit Diagnosis: Stiffness of right knee, not elsewhere classified  Acute pain of right knee  Muscle weakness (generalized)  Localized edema  Other abnormalities  of gait and mobility  Acute pain of left knee     Problem List Patient Active Problem List   Diagnosis Date Noted  Closed patellar sleeve fracture, left, initial encounter 11/10/2019   Closed patellar sleeve fracture of right knee 10/28/2019   Finger fracture, right 10/28/2019   Conjunctivitis 11/07/2017   Hyperlipidemia 01/01/2013   Allergic rhinitis 12/12/2011   Breast mass, right 11/23/2010   WEIGHT GAIN 11/07/2009   Depression, major, single episode, in partial remission ([place]) 09/26/2006   Essential hypertension 09/26/2006    Silvestre Mesi 02/17/2020, 11:50 AM  [facility] Physical Therapy [address] [place], [place], [address] Phone: [phone]   Fax:  [phone]  Name: TOSHIYE KEVER MRN: [id number] Date of Birth: [birth year]

## 2020-02-24 ENCOUNTER — Ambulatory Visit: Payer: Medicare Other | Admitting: Physical Therapy

## 2020-02-24 ENCOUNTER — Other Ambulatory Visit: Payer: Self-pay

## 2020-02-24 DIAGNOSIS — M6281 Muscle weakness (generalized): Secondary | ICD-10-CM

## 2020-02-24 DIAGNOSIS — M25661 Stiffness of right knee, not elsewhere classified: Secondary | ICD-10-CM | POA: Diagnosis not present

## 2020-02-24 DIAGNOSIS — R2689 Other abnormalities of gait and mobility: Secondary | ICD-10-CM

## 2020-02-24 DIAGNOSIS — R600 Localized edema: Secondary | ICD-10-CM

## 2020-02-24 DIAGNOSIS — M25562 Pain in left knee: Secondary | ICD-10-CM

## 2020-02-24 DIAGNOSIS — M25561 Pain in right knee: Secondary | ICD-10-CM

## 2020-02-24 NOTE — Therapy (Signed)
[facility] Physical Therapy [address] [place], [place], [address] Phone: [phone]   Fax:  [phone]  Physical Therapy Treatment  Patient Details  Name: April Castillo MRN: [id number] Date of Birth: 10/21/1955 Referring Provider (PT): Marlou Sa Tonna Corner, MD   Encounter Date: 02/24/2020   PT End of Session - 02/24/20 0930    Visit Number 14    Number of Visits 24    Date for PT Re-Evaluation 03/15/20    PT Start Time 0845    PT Stop Time 0930    PT Time Calculation (min) 45 min    Activity Tolerance Patient tolerated treatment well    Behavior During Therapy [facility] for tasks assessed/performed           Past Medical History:  Diagnosis Date   Depression    Heart murmur    Hip fracture, right ([place]) 11/23/2010   Hypertension    Kidney stone    KNEE PAIN, LEFT 11/07/2009   Other and unspecified hyperlipidemia 01/01/2013   PERIMENOPAUSAL SYNDROME 11/07/2009   Rosacea 06/20/2006   Tension headache 06/20/2006        UTERINE FIBROID 06/20/2006    Past Surgical History:  Procedure Laterality Date   CATARACT EXTRACTION W/ INTRAOCULAR LENS IMPLANT Left 08/03/13   Dr. Katy Fitch @ [place] [facility] [place] Right    Cataract   ORIF PATELLA Right 10/29/2019   Procedure: OPEN REDUCTION INTERNAL (ORIF) FIXATION PATELLA;  Surgeon: Meredith Pel, MD;  Location: [place];  Service: Orthopedics;  Laterality: Right;   r hip fracture and repair  January 24, 2010    There were no vitals filed for this visit.   Subjective Assessment - 02/24/20 0856    Subjective she went on vacation last week, wore knee brace on flight which helped some. Does not relay any new complaints in her left knee other than continued stiffness    Pertinent History Rt hip ORIF 9 years ago    Limitations Standing;Walking;House hold activities    Patient Stated Goals improve ROM and strength in her knees    Pain Onset More than a month ago              [facility] PT Assessment -  02/24/20 0001      Assessment   Medical Diagnosis Closed sleeve fracture of right and left patella with routine healing, subsequent encounter with Rt patella ORIF 10/29/19. Rt finger fracture    Referring Provider (PT) Marlou Sa Tonna Corner, MD    Onset Date/Surgical Date 10/29/19      PROM   Right Knee Extension 0    Right Knee Flexion 121                         [facility] Adult PT Treatment/Exercise - 02/24/20 0001      Knee/Hip Exercises: Stretches   Quad Stretch Right;3 reps;60 seconds    Quad Stretch Limitations prone with strap    Knee: Self-Stretch Limitations standing lunge stretch with Rt foot in chair 10 sec X 10 reps, child pose in quadriped on mat tablesitting back on heels 20 sec X 5      Knee/Hip Exercises: Machines for Strengthening   Cybex Knee Extension 15 lbs bilat 3X10    Cybex Knee Flexion 35 lbs bilat 3X10    Cybex Leg Press performed bilat push at 118 lbs 3X10 then dropped to SL leg only 62 lbs 3X10 ea side  Knee/Hip Exercises: Standing   Forward Step Up Both;15 reps;Step Height: 8"    Step Down Right;Hand Hold: 1;2 sets;Step Height: 8";15 reps      Manual Therapy   Manual therapy comments  supine knee flexion PROM and flexion mobs                    PT Short Term Goals - 02/08/20 1525      PT SHORT TERM GOAL #1   Title Pt will be I and compliant with inital HEP    Time 4    Period Weeks    Status Achieved      PT SHORT TERM GOAL #2   Title Pt will improve Rt knee flexion ROM to 90 deg    Time 4    Period Weeks    Status Achieved             PT Long Term Goals - 02/15/20 1647      PT LONG TERM GOAL #1   Title Pt will be I and compliant with final HEP    Time 12    Period Weeks    Status On-going      PT LONG TERM GOAL #2   Title Pt will improve Rt knee AROM 0-120 deg to improve function.    Baseline 0-110 AROM can get to 121 PROM    Time 12    Period Weeks    Status On-going      PT LONG TERM GOAL #3    Title Pt will improve bilat hip strength to 4+ and bilat knee strength to 5 MMT    Baseline now 4+    Time 12    Period Weeks    Status Achieved      PT LONG TERM GOAL #4   Title Pt will be able to ambulate community distances 1000 ft on even and uneven surfaces and negotiate stairs with less than 3/10 pain or difficulty.    Baseline still with difficulty going down stairs.    Time 12    Period Weeks    Status On-going                 Plan - 02/24/20 0931    Clinical Impression Statement She contineus to do well with PT. She was able to to progress strength today with good tolerance. She will follow up with MD on Friday.    Examination-Activity Limitations Bend;Carry;Lift;Stand;Stairs;Squat;Locomotion Level;Transfers    Examination-Participation Restrictions Cleaning;Driving;Yard Work;Shop    Stability/Clinical Decision Making Evolving/Moderate complexity    Rehab Potential Good    PT Frequency 2x / week    PT Duration 12 weeks    PT Treatment/Interventions ADLs/Self Care Home Management;Cryotherapy;Electrical Stimulation;Iontophoresis 4mg /ml Dexamethasone;Moist Heat;Ultrasound;Gait training;Stair training;Therapeutic activities;Therapeutic exercise;Neuromuscular re-education;Balance training;Manual techniques;Passive range of motion;Dry needling;Joint Manipulations;Vasopneumatic Device;Taping    PT Next Visit Plan progress knee flexion ROM and hip/knee strength as able.    PT Home Exercise Plan Access Code: [email or web address]    [email or web address] and Agree with Plan of Care Patient           Patient will benefit from skilled therapeutic intervention in order to improve the following deficits and impairments:  Abnormal gait, Decreased activity tolerance, Decreased endurance, Decreased mobility, Decreased strength, Decreased range of motion, Difficulty walking, Hypomobility, Increased edema, Impaired flexibility, Increased muscle spasms, Increased fascial restricitons, Impaired UE functional use,  Pain  Visit Diagnosis: Stiffness of right knee, not elsewhere classified  Acute pain of right knee  Muscle weakness (generalized)  Localized edema  Other abnormalities of gait and mobility  Acute pain of left knee     Problem List Patient Active Problem List   Diagnosis Date Noted   Closed patellar sleeve fracture, left, initial encounter 11/10/2019   Closed patellar sleeve fracture of right knee 10/28/2019   Finger fracture, right 10/28/2019   Conjunctivitis 11/07/2017   Hyperlipidemia 01/01/2013   Allergic rhinitis 12/12/2011   Breast mass, right 11/23/2010   WEIGHT GAIN 11/07/2009   Depression, major, single episode, in partial remission ([place]) 09/26/2006   Essential hypertension 09/26/2006    Silvestre Mesi 02/24/2020, 9:32 AM  [facility] Physical Therapy [address] [place], [place], [address] Phone: [phone]   Fax:  [phone]  Name: April Castillo MRN: [id number] Date of Birth: October 31, 1955

## 2020-02-26 ENCOUNTER — Ambulatory Visit: Payer: Medicare Other | Admitting: Orthopedic Surgery

## 2020-02-26 ENCOUNTER — Other Ambulatory Visit: Payer: Self-pay

## 2020-02-26 ENCOUNTER — Ambulatory Visit: Payer: Medicare Other | Admitting: Physical Therapy

## 2020-02-26 DIAGNOSIS — M25661 Stiffness of right knee, not elsewhere classified: Secondary | ICD-10-CM

## 2020-02-26 DIAGNOSIS — M25562 Pain in left knee: Secondary | ICD-10-CM

## 2020-02-26 DIAGNOSIS — R600 Localized edema: Secondary | ICD-10-CM

## 2020-02-26 DIAGNOSIS — S62650A Nondisplaced fracture of medial phalanx of right index finger, initial encounter for closed fracture: Secondary | ICD-10-CM

## 2020-02-26 DIAGNOSIS — R2689 Other abnormalities of gait and mobility: Secondary | ICD-10-CM

## 2020-02-26 DIAGNOSIS — M6281 Muscle weakness (generalized): Secondary | ICD-10-CM | POA: Diagnosis not present

## 2020-02-26 DIAGNOSIS — M25561 Pain in right knee: Secondary | ICD-10-CM

## 2020-02-26 NOTE — Therapy (Signed)
[facility] Physical Therapy [address] [place], [place], [address] Phone: [phone]   Fax:  [phone]  Physical Therapy Treatment/Progress note Progress Note reporting period 02/08/20 to 02/26/20  See below for objective and subjective measurements relating to patients progress with PT.   Patient Details  Name: April Castillo MRN: [id number] Date of Birth: 11-05-1955 Referring Provider (PT): Marlou Sa Tonna Corner, MD   Encounter Date: 02/26/2020   PT End of Session - 02/26/20 0900    Visit Number 15    Number of Visits 24    Date for PT Re-Evaluation 03/15/20    Progress Note Due on Visit 25    PT Start Time 0847    PT Stop Time 0927    PT Time Calculation (min) 40 min    Activity Tolerance Patient tolerated treatment well    Behavior During Therapy [facility] for tasks assessed/performed           Past Medical History:  Diagnosis Date  . Depression   . Heart murmur   . Hip fracture, right ([place]) 11/23/2010  . Hypertension   . Kidney stone   . KNEE PAIN, LEFT 11/07/2009  . Other and unspecified hyperlipidemia 01/01/2013  . PERIMENOPAUSAL SYNDROME 11/07/2009  . Rosacea 06/20/2006  . Tension headache 06/20/2006       . UTERINE FIBROID 06/20/2006    Past Surgical History:  Procedure Laterality Date  . CATARACT EXTRACTION W/ INTRAOCULAR LENS IMPLANT Left 08/03/13   Dr. Katy Fitch @ [place] [facility] [place]  . EYE SURGERY Right    Cataract  . ORIF PATELLA Right 10/29/2019   Procedure: OPEN REDUCTION INTERNAL (ORIF) FIXATION PATELLA;  Surgeon: Meredith Pel, MD;  Location: [place];  Service: Orthopedics;  Laterality: Right;  . r hip fracture and repair  January 24, 2010    There were no vitals filed for this visit.   Subjective Assessment - 02/26/20 0859    Subjective she relays the knee is doing well, no pain upon arrival.    Pertinent History Rt hip ORIF 9 years ago    Limitations Standing;Walking;House hold activities    Patient Stated Goals improve ROM and strength  in her knees              [facility] PT Assessment - 02/26/20 0001      Assessment   Medical Diagnosis Closed sleeve fracture of right and left patella with routine healing, subsequent encounter with Rt patella ORIF 10/29/19. Rt finger fracture    Referring Provider (PT) Marlou Sa Tonna Corner, MD    Onset Date/Surgical Date 10/29/19      AROM   Right Knee Extension 0    Right Knee Flexion 120      PROM   Right Knee Extension 0    Right Knee Flexion 123      Strength   Right Hip Flexion 5/5    Right Hip ABduction 4+/5    Right Knee Flexion 5/5    Right Knee Extension 5/5                         [facility] Adult PT Treatment/Exercise - 02/26/20 0001      Knee/Hip Exercises: Stretches   Sports administrator Right;3 reps;60 seconds    Quad Stretch Limitations prone with strap    Knee: Self-Stretch Limitations standing lunge stretch with Rt foot in chair 10 sec X 10 reps, child pose in quadriped on mat tablesitting back on heels 20 sec X 5  Knee/Hip Exercises: Aerobic   Recumbent Bike seat 6 10 min L5      Knee/Hip Exercises: Machines for Strengthening   Cybex Knee Extension 15 lbs bilat 3X10    Cybex Knee Flexion 35 lbs bilat 3X15    Cybex Leg Press performed bilat push at 118 lbs 3X10      Knee/Hip Exercises: Standing   Lateral Step Up Right;15 reps;Hand Hold: 1;Step Height: 8"    Forward Step Up Right;15 reps;Hand Hold: 0;Step Height: 8"    Step Down Right;Hand Hold: 1;Step Height: 8";15 reps    Other Standing Knee Exercises with UE support mini lunges at counter X 15 reps bilat      Manual Therapy   Manual therapy comments  supine knee flexion PROM and flexion mobs                    PT Short Term Goals - 02/08/20 1525      PT SHORT TERM GOAL #1   Title Pt will be I and compliant with inital HEP    Time 4    Period Weeks    Status Achieved      PT SHORT TERM GOAL #2   Title Pt will improve Rt knee flexion ROM to 90 deg    Time 4    Period Weeks     Status Achieved             PT Long Term Goals - 02/26/20 7673      PT LONG TERM GOAL #1   Title Pt will be I and compliant with final HEP    Time 12    Period Weeks    Status On-going      PT LONG TERM GOAL #2   Title Pt will improve Rt knee AROM 0-120 deg to improve function.    Baseline 0-110 AROM can get to 121 PROM    Time 12    Period Weeks    Status On-going      PT LONG TERM GOAL #3   Title Pt will improve bilat hip strength to 4+ and bilat knee strength to 5 MMT    Baseline now 4+    Time 12    Period Weeks    Status Achieved      PT LONG TERM GOAL #4   Title Pt will be able to ambulate community distances 1000 ft on even and uneven surfaces and negotiate stairs with less than 3/10 pain or difficulty.    Baseline now can negotiate stairs without difficuly    Time 12    Period Weeks    Status Achieved                 Plan - 02/26/20 4193    Clinical Impression Statement she was done really will with PT and is on track to meet her goals. She now only has mild deficits with strength and ROM. She would benefit from 2 more weeks of PT to maximize ROM and finalize HEP progression.    Examination-Activity Limitations Bend;Carry;Lift;Stand;Stairs;Squat;Locomotion Level;Transfers    Examination-Participation Restrictions Cleaning;Driving;Yard Work;Shop    Stability/Clinical Decision Making Evolving/Moderate complexity    Rehab Potential Good    PT Frequency 2x / week    PT Duration 12 weeks    PT Treatment/Interventions ADLs/Self Care Home Management;Cryotherapy;Electrical Stimulation;Iontophoresis 4mg /ml Dexamethasone;Moist Heat;Ultrasound;Gait training;Stair training;Therapeutic activities;Therapeutic exercise;Neuromuscular re-education;Balance training;Manual techniques;Passive range of motion;Dry needling;Joint Manipulations;Vasopneumatic Device;Taping    PT Next Visit Plan what did MD  say? progress knee flexion ROM and hip/knee strength as able.    PT  Home Exercise Plan Access Code: [email or web address]    [email or web address] and Agree with Plan of Care Patient           Patient will benefit from skilled therapeutic intervention in order to improve the following deficits and impairments:  Abnormal gait, Decreased activity tolerance, Decreased endurance, Decreased mobility, Decreased strength, Decreased range of motion, Difficulty walking, Hypomobility, Increased edema, Impaired flexibility, Increased muscle spasms, Increased fascial restricitons, Impaired UE functional use, Pain  Visit Diagnosis: Stiffness of right knee, not elsewhere classified  Acute pain of right knee  Muscle weakness (generalized)  Localized edema  Other abnormalities of gait and mobility  Acute pain of left knee     Problem List Patient Active Problem List   Diagnosis Date Noted  . Closed patellar sleeve fracture, left, initial encounter 11/10/2019  . Closed patellar sleeve fracture of right knee 10/28/2019  . Finger fracture, right 10/28/2019  . Conjunctivitis 11/07/2017  . Hyperlipidemia 01/01/2013  . Allergic rhinitis 12/12/2011  . Breast mass, right 11/23/2010  . WEIGHT GAIN 11/07/2009  . Depression, major, single episode, in partial remission ([place]) 09/26/2006  . Essential hypertension 09/26/2006    Silvestre Mesi 02/26/2020, 9:38 AM  [facility] Physical Therapy [address] [place], [place], [address] Phone: [phone]   Fax:  [phone]  Name: April Castillo MRN: [id number] Date of Birth: Jun 19, 1955

## 2020-02-28 ENCOUNTER — Encounter: Payer: Self-pay | Admitting: Orthopedic Surgery

## 2020-02-28 NOTE — Progress Notes (Signed)
Office Visit Note   Patient: April Castillo           Date of Birth: [birth year]           MRN: [id number] Visit Date: 02/26/2020 Requested by: Gifford Shave, MD [address],  [place] [address] PCP: Gifford Shave, MD  Subjective: Chief Complaint  Patient presents with  . Right Knee - Follow-up, Fracture    10/29/2019 ORIF right patella  . Left Knee - Follow-up, Fracture  . Right Index Finger - Follow-up, Fracture    HPI: April Castillo is a 64 year old patient with right knee patella fracture left knee nonoperative closed patella fracture and right index finger middle phalanx fracture. She underwent right patella fracture fixation 10/29/2019. Overall knees are doing well. The finger is she states "it is good is is going to get". Not taking any medication for pain. States she can bend the knee 120 degrees on her own. Has more physical therapy remaining.              ROS: All systems reviewed are negative as they relate to the chief complaint within the history of present illness.  Patient denies  fevers or chills.   Assessment & Plan: Visit Diagnoses:  1. Closed nondisplaced fracture of middle phalanx of right index finger, initial encounter     Plan: Impression is good function bilateral patella fractures. Knees have excellent range of motion and strength with no effusion. More crepitus on the right than the left as expected. No extensor mechanism tenderness. Right index finger is healed but has diminished motion at the PIP and DIP joint consistent with her fracture. Finger is functional but she is lost about 40% of the range of motion in each of those joints. Overall she is doing well and can function with ADLs. Follow-up with me as needed  Follow-Up Instructions: Return if symptoms worsen or fail to improve.   Orders:  No orders of the defined types were placed in this encounter.  No orders of the defined types were placed in this encounter.     Procedures: No procedures  performed   Clinical Data: No additional findings.  Objective: Vital Signs: There were no vitals taken for this visit.  Physical Exam:   Constitutional: Patient appears well-developed HEENT:  Head: Normocephalic Eyes:EOM are normal Neck: Normal range of motion Cardiovascular: Normal rate Pulmonary/chest: Effort normal Neurologic: Patient is alert Skin: Skin is warm Psychiatric: Patient has normal mood and affect    Ortho Exam: Ortho exam demonstrates excellent range of motion both knees right and left 0-1 20. No effusion in either knee. Extensor strength is intact bilaterally. Collateral and cruciate ligaments are stable bilaterally. More crepitus on the right with active range of motion in the left. No tenderness to palpation of the extensor mechanism  Right finger demonstrates DIP flexion of the index finger to about 45 degrees. PIP flexion is also to about 50 degrees. Collaterals are stable at both these joints  Specialty Comments:  No specialty comments available.  Imaging: No results found.   PMFS History: Patient Active Problem List   Diagnosis Date Noted  . Closed patellar sleeve fracture, left, initial encounter 11/10/2019  . Closed patellar sleeve fracture of right knee 10/28/2019  . Finger fracture, right 10/28/2019  . Conjunctivitis 11/07/2017  . Hyperlipidemia 01/01/2013  . Allergic rhinitis 12/12/2011  . Breast mass, right 11/23/2010  . WEIGHT GAIN 11/07/2009  . Depression, major, single episode, in partial remission ([place]) 09/26/2006  .  Essential hypertension 09/26/2006   Past Medical History:  Diagnosis Date  . Depression   . Heart murmur   . Hip fracture, right ([place]) 11/23/2010  . Hypertension   . Kidney stone   . KNEE PAIN, LEFT 11/07/2009  . Other and unspecified hyperlipidemia 01/01/2013  . PERIMENOPAUSAL SYNDROME 11/07/2009  . Rosacea 06/20/2006  . Tension headache 06/20/2006       . UTERINE FIBROID 06/20/2006    Family History  Problem  Relation Age of Onset  . Cancer Father   . Hypertension Father   . Cancer Sister   . Thyroid disease Sister   . Breast cancer Sister   . Cancer Maternal Uncle   . Diabetes Neg Hx   . Heart disease Neg Hx     Past Surgical History:  Procedure Laterality Date  . CATARACT EXTRACTION W/ INTRAOCULAR LENS IMPLANT Left 08/03/13   Dr. Katy Fitch @ [facility]  . EYE SURGERY Right    Cataract  . ORIF PATELLA Right 10/29/2019   Procedure: OPEN REDUCTION INTERNAL (ORIF) FIXATION PATELLA;  Surgeon: Meredith Pel, MD;  Location: [facility];  Service: Orthopedics;  Laterality: Right;  . r hip fracture and repair  January 24, 2010   Social History   Occupational History  . 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 Smoking status: Never Smoker  . Smokeless tobacco: Never Used  Substance and Sexual Activity  . Alcohol use: Yes    Alcohol/week: 3.0 standard drinks    Types: 1 Glasses of wine, 2 Cans of beer per week    Comment: few drinks a week  . Drug use: No  . Sexual activity: Yes    Birth control/protection: None

## 2020-03-02 ENCOUNTER — Ambulatory Visit: Payer: Medicare Other | Admitting: Physical Therapy

## 2020-03-02 ENCOUNTER — Telehealth: Payer: Self-pay | Admitting: Physical Therapy

## 2020-03-02 ENCOUNTER — Encounter: Payer: Self-pay | Admitting: Physical Therapy

## 2020-03-02 ENCOUNTER — Other Ambulatory Visit: Payer: Self-pay

## 2020-03-02 DIAGNOSIS — R2689 Other abnormalities of gait and mobility: Secondary | ICD-10-CM

## 2020-03-02 DIAGNOSIS — M25661 Stiffness of right knee, not elsewhere classified: Secondary | ICD-10-CM | POA: Diagnosis not present

## 2020-03-02 DIAGNOSIS — M25561 Pain in right knee: Secondary | ICD-10-CM

## 2020-03-02 DIAGNOSIS — R600 Localized edema: Secondary | ICD-10-CM

## 2020-03-02 DIAGNOSIS — M6281 Muscle weakness (generalized): Secondary | ICD-10-CM | POA: Diagnosis not present

## 2020-03-02 DIAGNOSIS — M79644 Pain in right finger(s): Secondary | ICD-10-CM

## 2020-03-02 DIAGNOSIS — M25562 Pain in left knee: Secondary | ICD-10-CM

## 2020-03-02 NOTE — Therapy (Signed)
[facility] Physical Therapy [address] [place], [place], [address] Phone: [phone]   Fax:  [phone]  Physical Therapy Treatment  Patient Details  Name: April Castillo MRN: [id number] Date of Birth: 1955-12-25 Referring Provider (PT): Marlou Sa Tonna Corner, MD   Encounter Date: 03/02/2020   PT End of Session - 03/02/20 0853    Visit Number 16    Number of Visits 24    Date for PT Re-Evaluation 03/15/20    Progress Note Due on Visit 25    PT Start Time 0845    PT Stop Time 0930    PT Time Calculation (min) 45 min    Activity Tolerance Patient tolerated treatment well    Behavior During Therapy [facility] for tasks assessed/performed           Past Medical History:  Diagnosis Date   Depression    Heart murmur    Hip fracture, right ([place]) 11/23/2010   Hypertension    Kidney stone    KNEE PAIN, LEFT 11/07/2009   Other and unspecified hyperlipidemia 01/01/2013   PERIMENOPAUSAL SYNDROME 11/07/2009   Rosacea 06/20/2006   Tension headache 06/20/2006        UTERINE FIBROID 06/20/2006    Past Surgical History:  Procedure Laterality Date   CATARACT EXTRACTION W/ INTRAOCULAR LENS IMPLANT Left 08/03/13   Dr. Katy Fitch @ [facility] Right    Cataract   ORIF PATELLA Right 10/29/2019   Procedure: OPEN REDUCTION INTERNAL (ORIF) FIXATION PATELLA;  Surgeon: Meredith Pel, MD;  Location: [place];  Service: Orthopedics;  Laterality: Right;   r hip fracture and repair  January 24, 2010    There were no vitals filed for this visit.   Subjective Assessment - 03/02/20 0845    Subjective She feels that she could benefit from a few more PT visits thru next week to work on few more details.  Dr. Marlou Sa visit pleased with knee range but index finger on dominant hand still has limited range.    Pertinent History Rt hip ORIF 9 years ago    Limitations Standing;Walking;House hold activities    Patient Stated Goals improve ROM and strength in her knees     Currently in Pain? No/denies              [facility] PT Assessment - 03/02/20 0001      AROM   Right Composite Finger Extension --   PIP -10*  DIP -21*   Right Composite Finger Flexion --   PIP 55*  DIP 28*   Left Composite Finger Extension --   PIP 0,  DIP 0*   Left Composite Finger Flexion --   PIP 121*  DIP 109*     Strength   Right/Left hand Right;Left    Right Hand Grip (lbs) 48.6   45.3, 45.7, 54.8   Right Hand Lateral Pinch 12 lbs   12# all 3 trials   Right Hand 3 Point Pinch 8 lbs   8# all 3 trials   Left Hand Grip (lbs) 53.13   54.1, 50.2, 55.1   Left Hand Lateral Pinch 11.67 lbs   13, 11, 11   Left Hand 3 Point Pinch 10 lbs   10# all 3 trials                        [facility] Adult PT Treatment/Exercise - 03/02/20 0845      Knee/Hip Exercises: [employer]  Stretch --    Sports administrator Limitations --    Knee: Self-Stretch Limitations --      Knee/Hip Exercises: Aerobic   Recumbent Bike seat 6 10 min L5      Knee/Hip Exercises: Machines for Strengthening   Cybex Knee Extension --    Cybex Knee Flexion --    Cybex Leg Press --      Knee/Hip Exercises: Standing   Lateral Step Up --    Forward Step Up --    Step Down --    Other Standing Knee Exercises --      Manual Therapy   Manual therapy comments --                  PT Education - 03/02/20 1627    Education Details index finger DIP & PIP stretch and contrast bath    Person(s) Educated Patient    Methods Explanation;Demonstration;Tactile cues;Verbal cues;Handout    Comprehension Verbalized understanding;Returned demonstration            PT Short Term Goals - 02/08/20 1525      PT SHORT TERM GOAL #1   Title Pt will be I and compliant with inital HEP    Time 4    Period Weeks    Status Achieved      PT SHORT TERM GOAL #2   Title Pt will improve Rt knee flexion ROM to 90 deg    Time 4    Period Weeks    Status Achieved             PT Long Term Goals - 02/26/20 4259        PT LONG TERM GOAL #1   Title Pt will be I and compliant with final HEP    Time 12    Period Weeks    Status On-going      PT LONG TERM GOAL #2   Title Pt will improve Rt knee AROM 0-120 deg to improve function.    Baseline 0-110 AROM can get to 121 PROM    Time 12    Period Weeks    Status On-going      PT LONG TERM GOAL #3   Title Pt will improve bilat hip strength to 4+ and bilat knee strength to 5 MMT    Baseline now 4+    Time 12    Period Weeks    Status Achieved      PT LONG TERM GOAL #4   Title Pt will be able to ambulate community distances 1000 ft on even and uneven surfaces and negotiate stairs with less than 3/10 pain or difficulty.    Baseline now can negotiate stairs without difficuly    Time 12    Period Weeks    Status Achieved                 Plan - 03/02/20 0853    Clinical Impression Statement PT assessed dominant hand index finger range and strength which are impaired.  Patient would benefit from short duration OT hand therapy to minimize limitations to dominant hand function.    Examination-Activity Limitations Bend;Carry;Lift;Stand;Stairs;Squat;Locomotion Level;Transfers    Examination-Participation Restrictions Cleaning;Driving;Yard Work;Shop    Stability/Clinical Decision Making Evolving/Moderate complexity    Rehab Potential Good    PT Frequency 2x / week    PT Duration 12 weeks    PT Treatment/Interventions ADLs/Self Care Home Management;Cryotherapy;Electrical Stimulation;Iontophoresis 4mg /ml Dexamethasone;Moist Heat;Ultrasound;Gait training;Stair training;Therapeutic activities;Therapeutic exercise;Neuromuscular re-education;Balance training;Manual techniques;Passive range of  motion;Dry needling;Joint Manipulations;Vasopneumatic Device;Taping    PT Next Visit Plan work towards [place] with plan to discharge next week, progress knee flexion ROM and hip/knee strength as able.    PT Home Exercise Plan Access Code: [email or web address]    Recommended Other  Services OT hand therapy    Consulted and Agree with Plan of Care Patient           Patient will benefit from skilled therapeutic intervention in order to improve the following deficits and impairments:  Abnormal gait, Decreased activity tolerance, Decreased endurance, Decreased mobility, Decreased strength, Decreased range of motion, Difficulty walking, Hypomobility, Increased edema, Impaired flexibility, Increased muscle spasms, Increased fascial restricitons, Impaired UE functional use, Pain  Visit Diagnosis: Stiffness of right knee, not elsewhere classified  Acute pain of right knee  Muscle weakness (generalized)  Localized edema  Other abnormalities of gait and mobility  Acute pain of left knee  Pain in right finger(s)     Problem List Patient Active Problem List   Diagnosis Date Noted   Closed patellar sleeve fracture, left, initial encounter 11/10/2019   Closed patellar sleeve fracture of right knee 10/28/2019   Finger fracture, right 10/28/2019   Conjunctivitis 11/07/2017   Hyperlipidemia 01/01/2013   Allergic rhinitis 12/12/2011   Breast mass, right 11/23/2010   WEIGHT GAIN 11/07/2009   Depression, major, single episode, in partial remission ([place]) 09/26/2006   Essential hypertension 09/26/2006    Jamey Reas PT, DPT 03/02/2020, 4:31 PM  [facility] Physical Therapy [address] [place], [place], [address] Phone: [phone]   Fax:  [phone]  Name: April Castillo MRN: [id number] Date of Birth: March 28, 1956

## 2020-03-02 NOTE — Patient Instructions (Addendum)
Contrast Bath  Prepare the baths.  Cold = 55-65 degrees     Hot = 105-110 degrees  Starting with the hot, dip hand or foot all the way into the water and hold there for selected duration.  Preferably 3 minutes.  After selected duration is up, dip hand or foot into the cold for 1/3 duration of the hot. (3 minutes hot, 1 minute cold)  Alternate back and forth for the times indicated for no more than a total of 20 minutes ending with hot.    PT demo & verbal cues for DIP & PIP flexion & extension manual stretch.

## 2020-03-02 NOTE — Telephone Encounter (Signed)
April Castillo's index finger on dominant hand is limited functionally with decreased range & strength. PT note on 11/10 has notes for range & strength.  She would benefit from OT hand therapy to improve range & function. She is in agreement with recommendation.  Can you please send a referral for OT eval & treat to [facility]? Thank you Jamey Reas, PT, DPT

## 2020-03-03 ENCOUNTER — Ambulatory Visit: Payer: Medicare Other | Admitting: Physical Therapy

## 2020-03-03 ENCOUNTER — Encounter: Payer: Self-pay | Admitting: Physical Therapy

## 2020-03-03 DIAGNOSIS — M25561 Pain in right knee: Secondary | ICD-10-CM

## 2020-03-03 DIAGNOSIS — M6281 Muscle weakness (generalized): Secondary | ICD-10-CM | POA: Diagnosis not present

## 2020-03-03 DIAGNOSIS — R600 Localized edema: Secondary | ICD-10-CM | POA: Diagnosis not present

## 2020-03-03 DIAGNOSIS — M25661 Stiffness of right knee, not elsewhere classified: Secondary | ICD-10-CM

## 2020-03-03 DIAGNOSIS — R2689 Other abnormalities of gait and mobility: Secondary | ICD-10-CM

## 2020-03-03 DIAGNOSIS — M79644 Pain in right finger(s): Secondary | ICD-10-CM

## 2020-03-03 DIAGNOSIS — M25562 Pain in left knee: Secondary | ICD-10-CM

## 2020-03-03 NOTE — Therapy (Signed)
[facility] Physical Therapy [address] [place], [place], [address] Phone: [phone]   Fax:  [phone]  Physical Therapy Treatment  Patient Details  Name: April Castillo MRN: [id number] Date of Birth: 1956-02-23 Referring Provider (PT): Marlou Sa Tonna Corner, MD   Encounter Date: 03/03/2020   PT End of Session - 03/03/20 1102    Visit Number 17    Number of Visits 24    Date for PT Re-Evaluation 03/15/20    Progress Note Due on Visit 25    PT Start Time 1059    PT Stop Time 1144    PT Time Calculation (min) 45 min    Activity Tolerance Patient tolerated treatment well    Behavior During Therapy [facility] for tasks assessed/performed           Past Medical History:  Diagnosis Date  . Depression   . Heart murmur   . Hip fracture, right ([place]) 11/23/2010  . Hypertension   . Kidney stone   . KNEE PAIN, LEFT 11/07/2009  . Other and unspecified hyperlipidemia 01/01/2013  . PERIMENOPAUSAL SYNDROME 11/07/2009  . Rosacea 06/20/2006  . Tension headache 06/20/2006       . UTERINE FIBROID 06/20/2006    Past Surgical History:  Procedure Laterality Date  . CATARACT EXTRACTION W/ INTRAOCULAR LENS IMPLANT Left 08/03/13   Dr. Katy Fitch @ [facility] [place]  . EYE SURGERY Right    Cataract  . ORIF PATELLA Right 10/29/2019   Procedure: OPEN REDUCTION INTERNAL (ORIF) FIXATION PATELLA;  Surgeon: Meredith Pel, MD;  Location: [place];  Service: Orthopedics;  Laterality: Right;  . r hip fracture and repair  January 24, 2010    There were no vitals filed for this visit.   Subjective Assessment - 03/03/20 1102    Subjective She worked on bending & straightening finger as PT directed but did not get to try contrast bath.  No increased discomfort in finger nor knee.    Pertinent History Rt hip ORIF 9 years ago    Limitations Standing;Walking;House hold activities    Patient Stated Goals improve ROM and strength in her knees    Currently in Pain? No/denies                              [facility] Adult PT Treatment/Exercise - 03/03/20 1059      Neuro Re-ed    Neuro Re-ed Details  BOSU round side up RLE at center point with light UE support :  step ups flexing LLE 15 reps  & moving LLE anterior to posterior around RLE for rotational stabilization 15 reps.    Tandem stance on foam beam 60 sec RLE in front & 60 sec RLE in back.         Knee/Hip Exercises: Stretches   Personnel officer Limitations prone with strap & towel roll under distal thigh    Knee: Self-Stretch Limitations standing lunge stretch with Rt foot in chair 10 sec X 10 reps, child pose in quadriped on mat tablesitting back on heels 20 sec X 5      Knee/Hip Exercises: Aerobic   Recumbent Bike seat 6 10 min L5      Knee/Hip Exercises: Machines for Strengthening   Cybex Knee Extension 15 lbs bilat 15 reps 2 sets    Cybex Knee Flexion 35 lbs bilat 2 sets X15    Cybex Leg Press performed bilat  push at 118 lbs 3X10 extension stabilization liftin LLE off plate      Knee/Hip Exercises: Standing   Lateral Step Up Right;15 reps;Hand Hold: 1;Step Height: 8"    Forward Step Up Right;15 reps;Hand Hold: 0;Step Height: 8"    Step Down Right;Hand Hold: 1;Step Height: 8";15 reps    Other Standing Knee Exercises with UE support mini lunges at counter X 15 reps bilat      Manual Therapy   Manual therapy comments --                    PT Short Term Goals - 02/08/20 1525      PT SHORT TERM GOAL #1   Title Pt will be I and compliant with inital HEP    Time 4    Period Weeks    Status Achieved      PT SHORT TERM GOAL #2   Title Pt will improve Rt knee flexion ROM to 90 deg    Time 4    Period Weeks    Status Achieved             PT Long Term Goals - 02/26/20 2703      PT LONG TERM GOAL #1   Title Pt will be I and compliant with final HEP    Time 12    Period Weeks    Status On-going      PT LONG TERM GOAL #2   Title Pt will  improve Rt knee AROM 0-120 deg to improve function.    Baseline 0-110 AROM can get to 121 PROM    Time 12    Period Weeks    Status On-going      PT LONG TERM GOAL #3   Title Pt will improve bilat hip strength to 4+ and bilat knee strength to 5 MMT    Baseline now 4+    Time 12    Period Weeks    Status Achieved      PT LONG TERM GOAL #4   Title Pt will be able to ambulate community distances 1000 ft on even and uneven surfaces and negotiate stairs with less than 3/10 pain or difficulty.    Baseline now can negotiate stairs without difficuly    Time 12    Period Weeks    Status Achieved                 Plan - 03/03/20 1102    Clinical Impression Statement PT increased difficulty of some exercises & added some balance /stabilization to program which she was able to perform.  She is going to try go back to [employer] this weekend and will bring questions to PT.    Examination-Activity Limitations Bend;Carry;Lift;Stand;Stairs;Squat;Locomotion Level;Transfers    Examination-Participation Restrictions Cleaning;Driving;Yard Work;Shop    Stability/Clinical Decision Making Evolving/Moderate complexity    Rehab Potential Good    PT Frequency 2x / week    PT Duration 12 weeks    PT Treatment/Interventions ADLs/Self Care Home Management;Cryotherapy;Electrical Stimulation;Iontophoresis 4mg /ml Dexamethasone;Moist Heat;Ultrasound;Gait training;Stair training;Therapeutic activities;Therapeutic exercise;Neuromuscular re-education;Balance training;Manual techniques;Passive range of motion;Dry needling;Joint Manipulations;Vasopneumatic Device;Taping    PT Next Visit Plan work towards [place] with plan to discharge next week, progress knee flexion ROM and hip/knee strength as able. check if OT referral written    PT Home Exercise Plan Access Code: [email or web address]    Consulted and Agree with Plan of Care Patient           Patient will  benefit from skilled therapeutic intervention in order to  improve the following deficits and impairments:  Abnormal gait, Decreased activity tolerance, Decreased endurance, Decreased mobility, Decreased strength, Decreased range of motion, Difficulty walking, Hypomobility, Increased edema, Impaired flexibility, Increased muscle spasms, Increased fascial restricitons, Impaired UE functional use, Pain  Visit Diagnosis: Stiffness of right knee, not elsewhere classified  Acute pain of right knee  Muscle weakness (generalized)  Localized edema  Other abnormalities of gait and mobility  Acute pain of left knee  Pain in right finger(s)     Problem List Patient Active Problem List   Diagnosis Date Noted  . Closed patellar sleeve fracture, left, initial encounter 11/10/2019  . Closed patellar sleeve fracture of right knee 10/28/2019  . Finger fracture, right 10/28/2019  . Conjunctivitis 11/07/2017  . Hyperlipidemia 01/01/2013  . Allergic rhinitis 12/12/2011  . Breast mass, right 11/23/2010  . WEIGHT GAIN 11/07/2009  . Depression, major, single episode, in partial remission ([place]) 09/26/2006  . Essential hypertension 09/26/2006    Jamey Reas PT, DPT 03/03/2020, 11:59 AM  [facility] Physical Therapy [address] [place], [place], [address] Phone: [phone]   Fax:  [phone]  Name: April Castillo MRN: [id number] Date of Birth: [birth year]

## 2020-03-08 ENCOUNTER — Other Ambulatory Visit: Payer: Self-pay

## 2020-03-08 ENCOUNTER — Ambulatory Visit: Payer: Medicare Other | Admitting: Physical Therapy

## 2020-03-08 DIAGNOSIS — R2689 Other abnormalities of gait and mobility: Secondary | ICD-10-CM

## 2020-03-08 DIAGNOSIS — M25661 Stiffness of right knee, not elsewhere classified: Secondary | ICD-10-CM | POA: Diagnosis not present

## 2020-03-08 DIAGNOSIS — M6281 Muscle weakness (generalized): Secondary | ICD-10-CM | POA: Diagnosis not present

## 2020-03-08 DIAGNOSIS — M25561 Pain in right knee: Secondary | ICD-10-CM | POA: Diagnosis not present

## 2020-03-08 DIAGNOSIS — M79644 Pain in right finger(s): Secondary | ICD-10-CM

## 2020-03-08 DIAGNOSIS — R600 Localized edema: Secondary | ICD-10-CM

## 2020-03-08 NOTE — Therapy (Signed)
[facility] Physical Therapy [address] [place], [place], [address] Phone: [phone]   Fax:  [phone]  Physical Therapy Treatment  Patient Details  Name: April Castillo MRN: [id number] Date of Birth: 05/03/1955 Referring Provider (PT): Marlou Sa Tonna Corner, MD   Encounter Date: 03/08/2020   PT End of Session - 03/08/20 1539    Visit Number 18    Number of Visits 24    Date for PT Re-Evaluation 03/15/20    Progress Note Due on Visit 25    PT Start Time 1430    PT Stop Time 1510    PT Time Calculation (min) 40 min    Activity Tolerance Patient tolerated treatment well    Behavior During Therapy [facility] for tasks assessed/performed           Past Medical History:  Diagnosis Date  . Depression   . Heart murmur   . Hip fracture, right ([place]) 11/23/2010  . Hypertension   . Kidney stone   . KNEE PAIN, LEFT 11/07/2009  . Other and unspecified hyperlipidemia 01/01/2013  . PERIMENOPAUSAL SYNDROME 11/07/2009  . Rosacea 06/20/2006  . Tension headache 06/20/2006       . UTERINE FIBROID 06/20/2006    Past Surgical History:  Procedure Laterality Date  . CATARACT EXTRACTION W/ INTRAOCULAR LENS IMPLANT Left 08/03/13   Dr. Katy Fitch @ [place] [facility] [place]  . EYE SURGERY Right    Cataract  . ORIF PATELLA Right 10/29/2019   Procedure: OPEN REDUCTION INTERNAL (ORIF) FIXATION PATELLA;  Surgeon: Meredith Pel, MD;  Location: [place];  Service: Orthopedics;  Laterality: Right;  . r hip fracture and repair  January 24, 2010    There were no vitals filed for this visit.   Subjective Assessment - 03/08/20 1525    Subjective She relays mild pain on her lateral Rt knee but overall doing well    Pertinent History Rt hip ORIF 9 years ago    Limitations Standing;Walking;House hold activities    Patient Stated Goals improve ROM and strength in her knees    Pain Onset More than a month ago              [facility] PT Assessment - 03/08/20 0001      Assessment   Medical Diagnosis Closed  sleeve fracture of right and left patella with routine healing, subsequent encounter with Rt patella ORIF 10/29/19. Rt finger fracture    Referring Provider (PT) Marlou Sa Tonna Corner, MD    Onset Date/Surgical Date 10/29/19      AROM   Right Knee Extension 0    Right Knee Flexion 122      PROM   Right Knee Flexion 125                         [facility] Adult PT Treatment/Exercise - 03/08/20 0001      Knee/Hip Exercises: Stretches   Sports administrator Right;3 reps;60 seconds    Quad Stretch Limitations prone with strap & towel roll under distal thigh    Knee: Self-Stretch Limitations seated tailgate stretch 10 sec X 3 min      Knee/Hip Exercises: Aerobic   Recumbent Bike seat 6 10 min L5      Knee/Hip Exercises: Machines for Strengthening   Cybex Knee Extension 15 lbs bilat 3 sets of 12    Cybex Knee Flexion 35 lbs bilat 2 sets X15    Cybex Leg Press performed bilat push at 118 lbs 3X10.  Knee/Hip Exercises: Standing   Forward Step Up Right;15 reps;Hand Hold: 0;Step Height: 8"    Forward Step Up Limitations alt leg march    Step Down Right;Hand Hold: 1;Step Height: 8";15 reps    Other Standing Knee Exercises with UE support mini lunges at counter X 15 reps bilat      Manual Therapy   Manual therapy comments  supine knee flexion PROM                    PT Short Term Goals - 02/08/20 1525      PT SHORT TERM GOAL #1   Title Pt will be I and compliant with inital HEP    Time 4    Period Weeks    Status Achieved      PT SHORT TERM GOAL #2   Title Pt will improve Rt knee flexion ROM to 90 deg    Time 4    Period Weeks    Status Achieved             PT Long Term Goals - 03/08/20 1548      PT LONG TERM GOAL #1   Title Pt will be I and compliant with final HEP    Time 12    Period Weeks    Status On-going      PT LONG TERM GOAL #2   Title Pt will improve Rt knee AROM 0-120 deg to improve function.    Baseline met 11/16    Time 12    Period  Weeks    Status Achieved      PT LONG TERM GOAL #3   Title Pt will improve bilat hip strength to 4+ and bilat knee strength to 5 MMT    Baseline now 4+    Time 12    Period Weeks    Status Achieved      PT LONG TERM GOAL #4   Title Pt will be able to ambulate community distances 1000 ft on even and uneven surfaces and negotiate stairs with less than 3/10 pain or difficulty.    Baseline now can negotiate stairs without difficuly    Time 12    Period Weeks    Status Achieved                 Plan - 03/08/20 1546    Clinical Impression Statement She has overall done well with PT, has now met her ROM goal. She has one more visit scheduled and will likely be ready for discharge to HEP and she plans to use [employer] to continue to work on her now only mild strenth deficits.    Examination-Activity Limitations Bend;Carry;Lift;Stand;Stairs;Squat;Locomotion Level;Transfers    Examination-Participation Restrictions Cleaning;Driving;Yard Work;Shop    Stability/Clinical Decision Making Evolving/Moderate complexity    Rehab Potential Good    PT Frequency 2x / week    PT Duration 12 weeks    PT Treatment/Interventions ADLs/Self Care Home Management;Cryotherapy;Electrical Stimulation;Iontophoresis 51m/ml Dexamethasone;Moist Heat;Ultrasound;Gait training;Stair training;Therapeutic activities;Therapeutic exercise;Neuromuscular re-education;Balance training;Manual techniques;Passive range of motion;Dry needling;Joint Manipulations;Vasopneumatic Device;Taping    PT Next Visit Plan work towards [place] plan to discharge. check if OT referral written for her finger    PT Home Exercise Plan Access Code: [email or web address]   Consulted and Agree with Plan of Care Patient           Patient will benefit from skilled therapeutic intervention in order to improve the following deficits and impairments:  Abnormal gait, Decreased activity  tolerance, Decreased endurance, Decreased mobility, Decreased  strength, Decreased range of motion, Difficulty walking, Hypomobility, Increased edema, Impaired flexibility, Increased muscle spasms, Increased fascial restricitons, Impaired UE functional use, Pain  Visit Diagnosis: Stiffness of right knee, not elsewhere classified  Acute pain of right knee  Muscle weakness (generalized)  Localized edema  Other abnormalities of gait and mobility  Pain in right finger(s)     Problem List Patient Active Problem List   Diagnosis Date Noted  . Closed patellar sleeve fracture, left, initial encounter 11/10/2019  . Closed patellar sleeve fracture of right knee 10/28/2019  . Finger fracture, right 10/28/2019  . Conjunctivitis 11/07/2017  . Hyperlipidemia 01/01/2013  . Allergic rhinitis 12/12/2011  . Breast mass, right 11/23/2010  . WEIGHT GAIN 11/07/2009  . Depression, major, single episode, in partial remission ([place]) 09/26/2006  . Essential hypertension 09/26/2006    Silvestre Mesi 03/08/2020, 3:49 PM  [facility] Physical Therapy [address] [place], [place], [address] Phone: [phone]   Fax:  [phone]  Name: April Castillo MRN: [id number] Date of Birth: July 05, 1955

## 2020-03-09 NOTE — Telephone Encounter (Signed)
Done

## 2020-03-09 NOTE — Telephone Encounter (Signed)
Prescription done can you send it to the hand therapist.  Thanks.  Probably the hand center would work.  Or occupational therapy with [facility].  Thanks

## 2020-03-10 ENCOUNTER — Ambulatory Visit: Payer: Medicare Other | Admitting: Rehabilitative and Restorative Service Providers"

## 2020-03-10 ENCOUNTER — Other Ambulatory Visit: Payer: Self-pay

## 2020-03-10 ENCOUNTER — Encounter: Payer: Self-pay | Admitting: Rehabilitative and Restorative Service Providers"

## 2020-03-10 DIAGNOSIS — M6281 Muscle weakness (generalized): Secondary | ICD-10-CM | POA: Diagnosis not present

## 2020-03-10 DIAGNOSIS — M25661 Stiffness of right knee, not elsewhere classified: Secondary | ICD-10-CM

## 2020-03-10 DIAGNOSIS — R262 Difficulty in walking, not elsewhere classified: Secondary | ICD-10-CM | POA: Diagnosis not present

## 2020-03-10 NOTE — Therapy (Signed)
[facility] Physical Therapy [address] [place], [place], [address] Phone: [phone]   Fax:  [phone]  Physical Therapy Treatment/Discharge  Patient Details  Name: April Castillo MRN: [id number] Date of Birth: 1955/08/18 Referring Provider (PT): Marlou Sa, Tonna Corner, MD  PHYSICAL THERAPY DISCHARGE SUMMARY  Visits from Start of Care: 19  Current functional level related to goals / functional outcomes: Met goals   Remaining deficits: None   Education / Equipment: HEP Plan: Patient agrees to discharge.  Patient goals were met. Patient is being discharged due to meeting the stated rehab goals.  ?????      Encounter Date: 03/10/2020   PT End of Session - 03/10/20 1109    Visit Number 19    Number of Visits 24    Date for PT Re-Evaluation 03/15/20    Progress Note Due on Visit 25    PT Start Time 1016    PT Stop Time 1100    PT Time Calculation (min) 44 min    Activity Tolerance Patient tolerated treatment well    Behavior During Therapy [facility] for tasks assessed/performed           Past Medical History:  Diagnosis Date   Depression    Heart murmur    Hip fracture, right ([place]) 11/23/2010   Hypertension    Kidney stone    KNEE PAIN, LEFT 11/07/2009   Other and unspecified hyperlipidemia 01/01/2013   PERIMENOPAUSAL SYNDROME 11/07/2009   Rosacea 06/20/2006   Tension headache 06/20/2006        UTERINE FIBROID 06/20/2006    Past Surgical History:  Procedure Laterality Date   CATARACT EXTRACTION W/ INTRAOCULAR LENS IMPLANT Left 08/03/13   Dr. Katy Fitch @ [place] [facility] [place]    Cataract   ORIF PATELLA Right 10/29/2019   Procedure: OPEN REDUCTION INTERNAL (ORIF) FIXATION PATELLA;  Surgeon: Meredith Pel, MD;  Location: [facility];  Service: Orthopedics;  Laterality: Right;   r hip fracture and repair  January 24, 2010    There were no vitals filed for this visit.   Subjective Assessment - 03/10/20 1044    Subjective [name]  reports R knee pain at 0-1/10 typically.    Pertinent History Rt hip ORIF 9 years ago    Limitations Standing;Walking;House hold activities    Patient Stated Goals improve ROM and strength in her knees    Currently in Pain? No/denies    Pain Score 0-No pain    Pain Location Knee    Pain Orientation Right    Pain Descriptors / Indicators Tightness    Pain Type Surgical pain    Pain Onset More than a month ago    Pain Frequency Intermittent    Aggravating Factors  Prolonged WB or sitting    Multiple Pain Sites No                             [facility] Adult PT Treatment/Exercise - 03/10/20 0001      Therapeutic Activites    Therapeutic Activities ADL's   Step up and over/down slow eccentrics     Neuro Re-ed    Neuro Re-ed Details  Heel to toe balance and single leg balance 4X 20 seconds each      Knee/Hip Exercises: Machines for Strengthening   Cybex Leg Press 3 sets of 10 slow eccentrics at 118#      Knee/Hip Exercises: Seated   Other Seated Knee/Hip  Exercises Tailgate knee flexion AROM  3 minutes    Sit to Sand 10 reps;without UE support   slow eccentrics     Knee/Hip Exercises: Prone   Other Prone Exercises Prone quadriceps stretch 5X 20 seconds                  PT Education - 03/10/20 1047    Education Details Reviewed HEP for long-term independent rehabilitation.    Person(s) Educated Patient    Methods Explanation;Demonstration;Verbal cues    Comprehension Returned demonstration;Verbalized understanding            PT Short Term Goals - 03/10/20 1108      PT SHORT TERM GOAL #1   Title Pt will be I and compliant with inital HEP    Time 4    Period Weeks    Status Achieved      PT SHORT TERM GOAL #2   Title Pt will improve Rt knee flexion ROM to 90 deg    Time 4    Period Weeks    Status Achieved             PT Long Term Goals - 03/10/20 1108      PT LONG TERM GOAL #1   Title Pt will be I and compliant with final HEP    Time  12    Period Weeks    Status Achieved      PT LONG TERM GOAL #2   Title Pt will improve Rt knee AROM 0-120 deg to improve function.    Baseline met 11/16    Time 12    Period Weeks    Status Achieved      PT LONG TERM GOAL #3   Title Pt will improve bilat hip strength to 4+ and bilat knee strength to 5 MMT    Baseline now 4+    Time 12    Period Weeks    Status Achieved      PT LONG TERM GOAL #4   Title Pt will be able to ambulate community distances 1000 ft on even and uneven surfaces and negotiate stairs with less than 3/10 pain or difficulty.    Baseline now can negotiate stairs without difficuly    Time 12    Period Weeks    Status Achieved                 Plan - 03/10/20 1109    Clinical Impression Statement April Castillo is independent and compliant with her long-term HEP.  She has met long-term goals and appears ready for independent rehabilitation.    Examination-Activity Limitations Bend;Carry;Lift;Stand;Stairs;Squat;Locomotion Level;Transfers    Examination-Participation Restrictions Cleaning;Driving;Yard Work;Shop    Stability/Clinical Decision Making Evolving/Moderate complexity    Rehab Potential Good    PT Frequency 2x / week    PT Duration 12 weeks    PT Treatment/Interventions ADLs/Self Care Home Management;Cryotherapy;Electrical Stimulation;Iontophoresis 10m/ml Dexamethasone;Moist Heat;Ultrasound;Gait training;Stair training;Therapeutic activities;Therapeutic exercise;Neuromuscular re-education;Balance training;Manual techniques;Passive range of motion;Dry needling;Joint Manipulations;Vasopneumatic Device;Taping    PT Next Visit Plan DC today    PT Home Exercise Plan Access Code: [email or web address]   Consulted and Agree with Plan of Care Patient           Patient will benefit from skilled therapeutic intervention in order to improve the following deficits and impairments:  Abnormal gait, Decreased activity tolerance, Decreased endurance, Decreased mobility, Decreased  strength, Decreased range of motion, Difficulty walking, Hypomobility, Increased edema, Impaired flexibility, Increased muscle spasms, Increased fascial  restricitons, Impaired UE functional use, Pain  Visit Diagnosis: Difficulty walking  Stiffness of right knee, not elsewhere classified  Muscle weakness (generalized)     Problem List Patient Active Problem List   Diagnosis Date Noted   Closed patellar sleeve fracture, left, initial encounter 11/10/2019   Closed patellar sleeve fracture of right knee 10/28/2019   Finger fracture, right 10/28/2019   Conjunctivitis 11/07/2017   Hyperlipidemia 01/01/2013   Allergic rhinitis 12/12/2011   Breast mass, right 11/23/2010   WEIGHT GAIN 11/07/2009   Depression, major, single episode, in partial remission ([place]) 09/26/2006   Essential hypertension 09/26/2006    Farley Ly PT, MPT 03/10/2020, 11:12 AM  [facility] Physical Therapy [address] [place], [place], [address] Phone: [phone]   Fax:  [phone]  Name: AYLANIE CUBILLOS MRN: [id number] Date of Birth: 1955-11-25

## 2020-04-25 ENCOUNTER — Emergency Department (HOSPITAL_COMMUNITY): Payer: Medicare Other

## 2020-04-25 ENCOUNTER — Emergency Department (HOSPITAL_COMMUNITY)
Admission: EM | Admit: 2020-04-25 | Discharge: 2020-04-25 | Disposition: A | Discharge status: Home / Self Care | Payer: Medicare Other | Attending: Emergency Medicine | Admitting: Emergency Medicine

## 2020-04-25 ENCOUNTER — Other Ambulatory Visit: Payer: Self-pay

## 2020-04-25 DIAGNOSIS — I10 Essential (primary) hypertension: Secondary | ICD-10-CM | POA: Diagnosis not present

## 2020-04-25 DIAGNOSIS — Z79899 Other long term (current) drug therapy: Secondary | ICD-10-CM | POA: Insufficient documentation

## 2020-04-25 DIAGNOSIS — S52501A Unspecified fracture of the lower end of right radius, initial encounter for closed fracture: Secondary | ICD-10-CM | POA: Insufficient documentation

## 2020-04-25 DIAGNOSIS — W010XXA Fall on same level from slipping, tripping and stumbling without subsequent striking against object, initial encounter: Secondary | ICD-10-CM | POA: Insufficient documentation

## 2020-04-25 DIAGNOSIS — S6991XA Unspecified injury of right wrist, hand and finger(s), initial encounter: Secondary | ICD-10-CM | POA: Diagnosis present

## 2020-04-25 DIAGNOSIS — Z7982 Long term (current) use of aspirin: Secondary | ICD-10-CM | POA: Diagnosis not present

## 2020-04-25 DIAGNOSIS — W19XXXA Unspecified fall, initial encounter: Secondary | ICD-10-CM

## 2020-04-25 MED ORDER — BUPIVACAINE HCL (PF) 0.5 % IJ SOLN
20.0000 mL | Freq: Once | INTRAMUSCULAR | Status: AC
  Administered 2020-04-25: 20 mL
  Filled 2020-04-25: qty 20

## 2020-04-25 MED ORDER — HYDROCODONE-ACETAMINOPHEN 5-325 MG PO TABS: 1.0000 | ORAL_TABLET | Freq: Four times a day (QID) | ORAL | 0 refills | Status: DC | PRN

## 2020-04-25 MED ORDER — HYDROCODONE-ACETAMINOPHEN 5-325 MG PO TABS
2.0000 | ORAL_TABLET | Freq: Once | ORAL | Status: AC
  Administered 2020-04-25: 2 via ORAL
  Filled 2020-04-25: qty 2

## 2020-04-25 NOTE — Progress Notes (Signed)
Orthopedic Tech Progress Note Patient Details:  April Castillo 1956/04/06 [id number] MD did a reduction of the wrist and the splint slid a little while he was molding it. Patient felt better afterwards  Ortho Devices Type of Ortho Device: [place] splint,Arm sling Ortho Device/Splint Location: RUE Ortho Device/Splint Interventions: Ordered,Application,Adjustment   Post Interventions Patient Tolerated: Well,Fair Instructions Provided: Care of device   Donald Pore 04/25/2020, 6:47 PM

## 2020-04-25 NOTE — Discharge Instructions (Signed)
Call to verify appointment and surgery date.  Use Tylenol and ibuprofen as needed for pain, ice and elevate. For severe pain take norco or vicodin however realize they have the potential for addiction and it can make you sleepy and has tylenol in it.  No operating machinery while taking.

## 2020-04-25 NOTE — ED Triage Notes (Signed)
Pt had mechanical fall just PTA, trying to catch herself with R wrist. Swelling, deformity, and pain noted to same. Also endorses right elbow pain. No LOC, no other injuries.

## 2020-04-25 NOTE — ED Provider Notes (Signed)
[facility] EMERGENCY DEPARTMENT Provider Note   CSN: [id number] Arrival date & time: 04/25/20  1428     History Chief Complaint  Patient presents with  . Wrist Pain    April Castillo is a 65 y.o. female.  Patient presents with right wrist and arm pain and swelling since prior to arrival.  Patient tripped trying to catch herself with her right wrist.  Patient has history of fracture in that wrist.  Patient recently healing from knee fracture.  No syncope or other injuries or symptoms.  Pain with movement.        Past Medical History:  Diagnosis Date  . Depression   . Heart murmur   . Hip fracture, right ([facility]) 11/23/2010  . Hypertension   . Kidney stone   . KNEE PAIN, LEFT 11/07/2009  . Other and unspecified hyperlipidemia 01/01/2013  . PERIMENOPAUSAL SYNDROME 11/07/2009  . Rosacea 06/20/2006  . Tension headache 06/20/2006       . UTERINE FIBROID 06/20/2006    Patient Active Problem List   Diagnosis Date Noted  . Closed patellar sleeve fracture, left, initial encounter 11/10/2019  . Closed patellar sleeve fracture of right knee 10/28/2019  . Finger fracture, right 10/28/2019  . Conjunctivitis 11/07/2017  . Hyperlipidemia 01/01/2013  . Allergic rhinitis 12/12/2011  . Breast mass, right 11/23/2010  . WEIGHT GAIN 11/07/2009  . Depression, major, single episode, in partial remission ([facility]) 09/26/2006  . Essential hypertension 09/26/2006    Past Surgical History:  Procedure Laterality Date  . CATARACT EXTRACTION W/ INTRAOCULAR LENS IMPLANT Left 08/03/13   Dr. Dione Booze @ [place] of [place]  . EYE SURGERY Right    Cataract  . ORIF PATELLA Right 10/29/2019   Procedure: OPEN REDUCTION INTERNAL (ORIF) FIXATION PATELLA;  Surgeon: Cammy Copa, MD;  Location: [facility] OR;  Service: Orthopedics;  Laterality: Right;  . r hip fracture and repair  January 24, 2010     OB History   No obstetric history on file.     Family History  Problem Relation Age of Onset  . Cancer  Father   . Hypertension Father   . Cancer Sister   . Thyroid disease Sister   . Breast cancer Sister   . Cancer Maternal Uncle   . Diabetes Neg Hx   . Heart disease Neg Hx     Social History   Tobacco Use  . Smoking status: Never Smoker  . Smokeless tobacco: Never Used  Substance Use Topics  . Alcohol use: Yes    Alcohol/week: 3.0 standard drinks    Types: 1 Glasses of wine, 2 Cans of beer per week    Comment: few drinks a week  . Drug use: No    Home Medications Prior to Admission medications   Medication Sig Start Date End Date Taking? Authorizing Provider  HYDROcodone-acetaminophen (NORCO) 5-325 MG tablet Take 1-2 tablets by mouth every 6 (six) hours as needed for severe pain. 04/25/20  Yes Blane Ohara, MD  alendronate (FOSAMAX) 70 MG tablet Take 70 mg by mouth once a week. Take with a full glass of water on an empty stomach.    [provider]  aspirin EC 81 MG tablet Take 81 mg by mouth daily. Swallow whole.    [provider]  FLUoxetine (PROZAC) 20 MG capsule Take 1 capsule (20 mg total) by mouth daily. 01/15/18   Marthenia Rolling, DO  lisinopril (PRINIVIL,ZESTRIL) 10 MG tablet TAKE 1 TABLET DAILY 10/03/17   Marcy Siren  Lauren, DO  methocarbamol (ROBAXIN) 500 MG tablet Take 1 tablet (500 mg total) by mouth every 6 (six) hours as needed for muscle spasms. Patient not taking: Reported on 02/26/2020 11/18/19   Ngetich, Nelda Bucks, NP  NON FORMULARY DIET: REGULAR, NAS, HEART HEALTHY 11/06/19   [provider]  rosuvastatin (CRESTOR) 20 MG tablet Take 20 mg by mouth daily.    [provider]    Allergies    Patient has no known allergies.  Review of Systems   Review of Systems  Constitutional: Negative for chills and fever.  HENT: Negative for congestion.   Eyes: Negative for visual disturbance.  Respiratory: Negative for shortness of breath.   Cardiovascular: Negative for chest pain.  Gastrointestinal: Negative for abdominal pain and  vomiting.  Genitourinary: Negative for dysuria and flank pain.  Musculoskeletal: Positive for joint swelling. Negative for back pain, neck pain and neck stiffness.  Skin: Negative for rash.  Neurological: Positive for numbness. Negative for weakness, light-headedness and headaches.    Physical Exam Updated Vital Signs BP (!) 146/78 (BP Location: Right Arm)   Pulse 79   Temp 98.7 F (37.1 C) (Oral)   Resp 15   SpO2 95%   Physical Exam Vitals and nursing note reviewed.  Constitutional:      Appearance: She is well-developed and well-nourished.  HENT:     Head: Normocephalic and atraumatic.  Eyes:     General:        Right eye: No discharge.        Left eye: No discharge.  Neck:     Trachea: No tracheal deviation.  Cardiovascular:     Rate and Rhythm: Normal rate.  Pulmonary:     Effort: Pulmonary effort is normal.  Abdominal:     General: There is no distension.  Musculoskeletal:        General: Swelling, tenderness, deformity and signs of injury present. No edema.     Cervical back: Normal range of motion.     Comments: No bony tenderness to right shoulder or humerus.  No proximal forearm bony tenderness.  Patient has swelling mild deformity dorsal aspect of distal radius, compartment soft, neurovascularly intact.  No hand tenderness.  Skin:    General: Skin is warm.     Findings: No rash.  Neurological:     General: No focal deficit present.     Mental Status: She is alert and oriented to person, place, and time.  Psychiatric:        Mood and Affect: Mood and affect and mood normal.     ED Results / Procedures / Treatments   Labs (all labs ordered are listed, but only abnormal results are displayed) Labs Reviewed - No data to display  EKG None  Radiology DG Wrist Complete Right  Result Date: 04/25/2020 CLINICAL DATA:  Pain EXAM: RIGHT WRIST - COMPLETE 3+ VIEW COMPARISON:  April 25, 2020 FINDINGS: The patient has undergone placement of a fiberglass cast. The  osseous alignment is slightly improved. Again noted are fractures of the distal radius and ulna with surrounding soft tissue swelling. IMPRESSION: Status post placement of a fiberglass cast with improved osseous alignment. Electronically Signed   By: Constance Holster M.D.   On: 04/25/2020 19:05   DG Wrist Complete Right  Result Date: 04/25/2020 CLINICAL DATA:  Right wrist deformity after fall today. EXAM: RIGHT WRIST - COMPLETE 3+ VIEW COMPARISON:  None. FINDINGS: Comminuted, mildly angulated, mildly impacted fracture of the distal right radius extending  to the radiocarpal joint. Minimally displaced fracture of the ulnar styloid process. IMPRESSION: 1. Comminuted, mildly angulated, mildly impacted fracture of the distal radius extending to the radiocarpal joint. 2. Minimally displaced fracture of the ulnar styloid process. Electronically Signed   By: Miachel Roux M.D.   On: 04/25/2020 15:25    Procedures Procedures (including critical care time)  Medications Ordered in ED Medications  bupivacaine (MARCAINE) 0.5 % injection 20 mL (20 mLs Infiltration Given 04/25/20 1722)  HYDROcodone-acetaminophen (NORCO/VICODIN) 5-325 MG per tablet 2 tablet (2 tablets Oral Given 04/25/20 1715)    ED Course  I have reviewed the triage vital signs and the nursing notes.  Pertinent labs & imaging results that were available during my care of the patient were reviewed by me and considered in my medical decision making (see chart for details).    MDM Rules/Calculators/A&P                          Patient presents with distal radius and ulnar fracture with mild angulation, x-ray reviewed. Mechanical fall, discussed with plastic surgeon on-call for hand and Dr. Claudia Desanctis will arrange close outpatient follow-up for surgical repair. Dr. Claudia Desanctis recommended reduction, splint and discharge.  Discussed risks and benefits of local injection, reduction and splint, patient comfortable this plan.  I was present and assisted with  bupivacaine injection and reduction of distal radius fracture. Discussed close follow-up to plan surgery with specialist.  Pain medicine sent to pharmacy, reviewed medication prescription history.  Post x-ray ordered after reduction with improved alignment, reviewed.    Final Clinical Impression(s) / ED Diagnoses Final diagnoses:  Closed fracture of distal end of right radius, unspecified fracture morphology, initial encounter  Fall, initial encounter    Rx / DC Orders ED Discharge Orders         Ordered    HYDROcodone-acetaminophen (NORCO) 5-325 MG tablet  Every 6 hours PRN        04/25/20 1845           Elnora Morrison, MD 04/25/20 1920

## 2020-04-25 NOTE — ED Provider Notes (Signed)
Reduction of fracture  Date/Time: 04/25/2020 6:20 PM Performed by: Corliss Blacker, MD Authorized by: Blane Ohara, MD  Consent: Verbal consent obtained. Consent given by: patient Patient identity confirmed: verbally with patient Local anesthesia used: yes Anesthesia: local infiltration  Anesthesia: Local anesthesia used: yes Local Anesthetic: bupivacaine 0.5% with epinephrine Anesthetic total: 10 mL  Sedation: Patient sedated: no  Patient tolerance: patient tolerated the procedure well with no immediate complications Comments: R distal radius fracture       Corliss Blacker, MD 04/26/20 Lindie Spruce    Blane Ohara, MD 04/27/20 [phone]

## 2020-04-26 ENCOUNTER — Encounter (HOSPITAL_BASED_OUTPATIENT_CLINIC_OR_DEPARTMENT_OTHER): Payer: Self-pay | Admitting: Plastic Surgery

## 2020-04-26 ENCOUNTER — Other Ambulatory Visit: Payer: Self-pay

## 2020-04-28 ENCOUNTER — Other Ambulatory Visit (HOSPITAL_COMMUNITY)
Admission: RE | Admit: 2020-04-28 | Discharge: 2020-04-28 | Disposition: A | Discharge status: Home / Self Care | Payer: Medicare Other | Source: Ambulatory Visit | Attending: Plastic Surgery | Admitting: Plastic Surgery

## 2020-04-28 DIAGNOSIS — Z20822 Contact with and (suspected) exposure to covid-19: Secondary | ICD-10-CM | POA: Diagnosis not present

## 2020-04-28 DIAGNOSIS — Z01812 Encounter for preprocedural laboratory examination: Secondary | ICD-10-CM | POA: Diagnosis present

## 2020-04-28 LAB — SARS CORONAVIRUS 2 (TAT 6-24 HRS): SARS Coronavirus 2: NEGATIVE

## 2020-04-29 ENCOUNTER — Telehealth: Payer: Self-pay | Admitting: Plastic Surgery

## 2020-04-29 NOTE — Telephone Encounter (Signed)
Called and spoke with the patient regarding her message below.  Informed her that I spoke with [facility] and she stated that it's okay for her to wait until Monday.    Informed her to keep her arm elevated as much as she can, and [place] ice.  This caneery helps the fluid to run down.  She can also take Ibuprofen 200mg  or 400mg  every 6 hours as needed.    Patient stated that she's already taking Tylenol with the Hydrocodone.  Asked the patient if she's taking the combination of Hydrocodone-acetaminophen.  She stated yes, and that she did not know you could take them both at the same time.  Patient verbalized understanding and agreed.//AB/CMA

## 2020-04-29 NOTE — Telephone Encounter (Signed)
Patient said she was told to call if she has more swelling or discoloration. She said it is more swollen than before and it comes and goes. She also said the colors are becoming yellowish. She wanted to know if this is okay to wait until Monday when she has surgery. Please call to advise.

## 2020-05-02 ENCOUNTER — Encounter (HOSPITAL_BASED_OUTPATIENT_CLINIC_OR_DEPARTMENT_OTHER): Payer: Self-pay | Admitting: Plastic Surgery

## 2020-05-02 ENCOUNTER — Other Ambulatory Visit: Payer: Self-pay

## 2020-05-02 ENCOUNTER — Encounter (HOSPITAL_BASED_OUTPATIENT_CLINIC_OR_DEPARTMENT_OTHER)
Admission: RE | Disposition: A | Discharge status: Home / Self Care | Payer: Self-pay | Source: Home / Self Care | Attending: Plastic Surgery

## 2020-05-02 ENCOUNTER — Ambulatory Visit (HOSPITAL_BASED_OUTPATIENT_CLINIC_OR_DEPARTMENT_OTHER): Payer: Medicare Other | Admitting: Certified Registered"

## 2020-05-02 ENCOUNTER — Ambulatory Visit (HOSPITAL_BASED_OUTPATIENT_CLINIC_OR_DEPARTMENT_OTHER)
Admission: RE | Admit: 2020-05-02 | Discharge: 2020-05-02 | Disposition: A | Discharge status: Home / Self Care | Payer: Medicare Other | Attending: Plastic Surgery | Admitting: Plastic Surgery

## 2020-05-02 DIAGNOSIS — X58XXXA Exposure to other specified factors, initial encounter: Secondary | ICD-10-CM | POA: Diagnosis not present

## 2020-05-02 DIAGNOSIS — S52571A Other intraarticular fracture of lower end of right radius, initial encounter for closed fracture: Secondary | ICD-10-CM

## 2020-05-02 HISTORY — DX: Personal history of urinary calculi: Z87.442

## 2020-05-02 HISTORY — DX: Nonrheumatic mitral (valve) prolapse: I34.1

## 2020-05-02 HISTORY — PX: ORIF RADIAL FRACTURE: SHX5113

## 2020-05-02 SURGERY — OPEN REDUCTION INTERNAL FIXATION (ORIF) RADIAL FRACTURE
Anesthesia: Monitor Anesthesia Care | Site: Wrist | Laterality: Right

## 2020-05-02 MED ORDER — FENTANYL CITRATE (PF) 100 MCG/2ML IJ SOLN
100.0000 ug | Freq: Once | INTRAMUSCULAR | Status: AC
  Administered 2020-05-02: 100 ug via INTRAVENOUS

## 2020-05-02 MED ORDER — MIDAZOLAM HCL 2 MG/2ML IJ SOLN
INTRAMUSCULAR | Status: AC
  Filled 2020-05-02: qty 2

## 2020-05-02 MED ORDER — HYDROMORPHONE HCL 1 MG/ML IJ SOLN: 0.2500 mg | INTRAMUSCULAR | Status: DC | PRN

## 2020-05-02 MED ORDER — PROPOFOL 500 MG/50ML IV EMUL
INTRAVENOUS | Status: DC | PRN
  Administered 2020-05-02: 75 ug/kg/min via INTRAVENOUS

## 2020-05-02 MED ORDER — BUPIVACAINE-EPINEPHRINE (PF) 0.25% -1:200000 IJ SOLN
INTRAMUSCULAR | Status: AC
  Filled 2020-05-02: qty 30

## 2020-05-02 MED ORDER — HYDROCODONE-ACETAMINOPHEN 5-325 MG PO TABS: 1.0000 | ORAL_TABLET | Freq: Four times a day (QID) | ORAL | 0 refills | Status: AC | PRN

## 2020-05-02 MED ORDER — LACTATED RINGERS IV SOLN: INTRAVENOUS | Status: DC

## 2020-05-02 MED ORDER — ONDANSETRON HCL 4 MG/2ML IJ SOLN
INTRAMUSCULAR | Status: DC | PRN
  Administered 2020-05-02: 4 mg via INTRAVENOUS

## 2020-05-02 MED ORDER — CEFAZOLIN SODIUM-DEXTROSE 2-4 GM/100ML-% IV SOLN
2.0000 g | INTRAVENOUS | Status: AC
  Administered 2020-05-02: 2 g via INTRAVENOUS

## 2020-05-02 MED ORDER — CEFAZOLIN SODIUM-DEXTROSE 2-4 GM/100ML-% IV SOLN
INTRAVENOUS | Status: AC
  Filled 2020-05-02: qty 100

## 2020-05-02 MED ORDER — OXYCODONE HCL 5 MG/5ML PO SOLN: 5.0000 mg | Freq: Once | ORAL | Status: DC | PRN

## 2020-05-02 MED ORDER — PROMETHAZINE HCL 25 MG/ML IJ SOLN: 6.2500 mg | INTRAMUSCULAR | Status: DC | PRN

## 2020-05-02 MED ORDER — BUPIVACAINE-EPINEPHRINE (PF) 0.25% -1:200000 IJ SOLN
INTRAMUSCULAR | Status: DC | PRN
  Administered 2020-05-02: 20 mL via PERINEURAL

## 2020-05-02 MED ORDER — FENTANYL CITRATE (PF) 100 MCG/2ML IJ SOLN
INTRAMUSCULAR | Status: AC
  Filled 2020-05-02: qty 2

## 2020-05-02 MED ORDER — OXYCODONE HCL 5 MG PO TABS: 5.0000 mg | ORAL_TABLET | Freq: Once | ORAL | Status: DC | PRN

## 2020-05-02 MED ORDER — ROPIVACAINE HCL 5 MG/ML IJ SOLN
INTRAMUSCULAR | Status: DC | PRN
  Administered 2020-05-02: 30 mL via PERINEURAL

## 2020-05-02 MED ORDER — LIDOCAINE HCL (CARDIAC) PF 100 MG/5ML IV SOSY
PREFILLED_SYRINGE | INTRAVENOUS | Status: DC | PRN
  Administered 2020-05-02: 30 mg via INTRAVENOUS

## 2020-05-02 MED ORDER — MIDAZOLAM HCL 2 MG/2ML IJ SOLN
2.0000 mg | Freq: Once | INTRAMUSCULAR | Status: AC
  Administered 2020-05-02: 2 mg via INTRAVENOUS

## 2020-05-02 SURGICAL SUPPLY — 71 items
APL PRP STRL LF DISP 70% ISPRP (MISCELLANEOUS) ×1
BIT DRILL 2.2 SS TIBIAL (BIT) ×1 IMPLANT
BLADE SURG 15 STRL LF DISP TIS (BLADE) ×1 IMPLANT
BLADE SURG 15 STRL SS (BLADE) ×2
BNDG CMPR 9X4 STRL LF SNTH (GAUZE/BANDAGES/DRESSINGS)
BNDG ELASTIC 3X5.8 VLCR STR LF (GAUZE/BANDAGES/DRESSINGS) ×1 IMPLANT
BNDG ELASTIC 4X5.8 VLCR STR LF (GAUZE/BANDAGES/DRESSINGS) ×1 IMPLANT
BNDG ESMARK 4X9 LF (GAUZE/BANDAGES/DRESSINGS) IMPLANT
BNDG GAUZE ELAST 4 BULKY (GAUZE/BANDAGES/DRESSINGS) ×1 IMPLANT
CHLORAPREP W/TINT 26 (MISCELLANEOUS) ×2 IMPLANT
CORD BIPOLAR FORCEPS 12FT (ELECTRODE) ×2 IMPLANT
COVER BACK TABLE 60X90IN (DRAPES) ×2 IMPLANT
COVER MAYO STAND STRL (DRAPES) ×2 IMPLANT
COVER WAND RF STERILE (DRAPES) IMPLANT
CUFF TOURN SGL QUICK 18X4 (TOURNIQUET CUFF) ×1 IMPLANT
DECANTER SPIKE VIAL GLASS SM (MISCELLANEOUS) ×1 IMPLANT
DRAPE EXTREMITY T 121X128X90 (DISPOSABLE) ×2 IMPLANT
DRAPE OEC MINIVIEW 54X84 (DRAPES) ×2 IMPLANT
DRAPE SURG 17X23 STRL (DRAPES) ×2 IMPLANT
DRIVER BIT SQUARE 1.7/2.2 (TRAUMA) ×1 IMPLANT
DRSG PAD ABDOMINAL 8X10 ST (GAUZE/BANDAGES/DRESSINGS) IMPLANT
GAUZE SPONGE 4X4 12PLY STRL (GAUZE/BANDAGES/DRESSINGS) ×2 IMPLANT
GAUZE XEROFORM 1X8 LF (GAUZE/BANDAGES/DRESSINGS) ×2 IMPLANT
GLOVE BIO SURGEON STRL SZ7.5 (GLOVE) ×1 IMPLANT
GLOVE SRG 8 PF TXTR STRL LF DI (GLOVE) ×1 IMPLANT
GLOVE SURG ENC TEXT LTX SZ7.5 (GLOVE) ×2 IMPLANT
GLOVE SURG UNDER POLY LF SZ8 (GLOVE) ×2
GOWN STRL REUS W/ TWL LRG LVL3 (GOWN DISPOSABLE) ×2 IMPLANT
GOWN STRL REUS W/TWL LRG LVL3 (GOWN DISPOSABLE) ×4
K-WIRE 1.6 (WIRE) ×4
K-WIRE FX5X1.6XNS BN SS (WIRE) ×2
KWIRE FX5X1.6XNS BN SS (WIRE) IMPLANT
NDL HYPO 25X1 1.5 SAFETY (NEEDLE) IMPLANT
NDL PRECISIONGLIDE 27X1.5 (NEEDLE) IMPLANT
NEEDLE HYPO 25X1 1.5 SAFETY (NEEDLE) IMPLANT
NEEDLE PRECISIONGLIDE 27X1.5 (NEEDLE) ×2 IMPLANT
NS IRRIG 1000ML POUR BTL (IV SOLUTION) ×1 IMPLANT
PACK BASIN DAY SURGERY FS (CUSTOM PROCEDURE TRAY) ×2 IMPLANT
PAD CAST 3X4 CTTN HI CHSV (CAST SUPPLIES) IMPLANT
PAD CAST 4YDX4 CTTN HI CHSV (CAST SUPPLIES) IMPLANT
PADDING CAST COTTON 3X4 STRL (CAST SUPPLIES)
PADDING CAST COTTON 4X4 STRL (CAST SUPPLIES)
PEG LOCKING SMOOTH 2.2X18 (Peg) ×3 IMPLANT
PEG LOCKING SMOOTH 2.2X20 (Screw) ×2 IMPLANT
PLATE NARROW DVR RIGHT (Plate) ×1 IMPLANT
SCREW 2.7X14MM (Screw) ×1 IMPLANT
SCREW BN 14X2.7XNONLOCK 3 LD (Screw) IMPLANT
SCREW LOCK 14X2.7X 3 LD TPR (Screw) IMPLANT
SCREW LOCKING 2.7X13MM (Screw) ×1 IMPLANT
SCREW LOCKING 2.7X14 (Screw) ×2 IMPLANT
SCREW NLOCK 2.7X14 (Screw) ×1 IMPLANT
SLEEVE SCD COMPRESS KNEE MED (MISCELLANEOUS) ×1 IMPLANT
SLING ARM FOAM STRAP LRG (SOFTGOODS) ×1 IMPLANT
SPLINT FIBERGLASS 3X35 (CAST SUPPLIES) ×1 IMPLANT
SPLINT PLASTER CAST XFAST 3X15 (CAST SUPPLIES) IMPLANT
SPLINT PLASTER XTRA FASTSET 3X (CAST SUPPLIES)
STOCKINETTE 4X48 STRL (DRAPES) ×2 IMPLANT
SUT CHROMIC 5 0 P 3 (SUTURE) IMPLANT
SUT ETHILON 4 0 PS 2 18 (SUTURE) IMPLANT
SUT MNCRL AB 4-0 PS2 18 (SUTURE) ×2 IMPLANT
SUT PLAIN 5 0 P 3 18 (SUTURE) ×1 IMPLANT
SUT PROLENE 5 0 P 3 (SUTURE) IMPLANT
SUT SILK 4 0 PS 2 (SUTURE) IMPLANT
SUT VIC AB 4-0 P-3 18XBRD (SUTURE) IMPLANT
SUT VIC AB 4-0 P3 18 (SUTURE)
SUT VICRYL 4-0 PS2 18IN ABS (SUTURE) IMPLANT
SYR BULB EAR ULCER 3OZ GRN STR (SYRINGE) ×1 IMPLANT
SYR CONTROL 10ML LL (SYRINGE) ×1 IMPLANT
TOWEL GREEN STERILE FF (TOWEL DISPOSABLE) ×2 IMPLANT
TRAP FINGER LRG (INSTRUMENTS) IMPLANT
UNDERPAD 30X36 HEAVY ABSORB (UNDERPADS AND DIAPERS) ×2 IMPLANT

## 2020-05-02 NOTE — Anesthesia Procedure Notes (Signed)
Anesthesia Regional Block: Supraclavicular block   Pre-Anesthetic Checklist: ,, timeout performed, Correct Patient, Correct Site, Correct Laterality, Correct Procedure, Correct Position, site marked, Risks and benefits discussed,  Surgical consent,  Pre-op evaluation,  At surgeon's request and post-op pain management  Laterality: Right  Prep: chloraprep       Needles:  Injection technique: Single-shot  Needle Type: Stimiplex     Needle Length: 9cm  Needle Gauge: 21     Additional Needles:   Procedures:,,,, ultrasound used (permanent image in chart),,,,  Narrative:  Start time: 05/02/2020 8:15 AM End time: 05/02/2020 8:20 AM Injection made incrementally with aspirations every 5 mL.  Performed by: Personally  Anesthesiologist: Lynda Rainwater, MD

## 2020-05-02 NOTE — Transfer of Care (Signed)
Immediate Anesthesia Transfer of Care Note  Patient: April Castillo  Procedure(s) Performed: OPEN REDUCTION INTERNAL FIXATION (ORIF) RADIAL FRACTURE (Right Wrist)  Patient Location: PACU  Anesthesia Type:MAC combined with regional for post-op pain  Level of Consciousness: awake, alert , oriented and patient cooperative  Airway & Oxygen Therapy: Patient Spontanous Breathing and Patient connected to face mask oxygen  Post-op Assessment: Report given to RN and Post -op Vital signs reviewed and stable  Post vital signs: Reviewed and stable  Last Vitals:  Vitals Value Taken Time  BP    Temp    Pulse    Resp    SpO2      Last Pain:  Vitals:   05/02/20 0730  TempSrc: Oral  PainSc: 1       Patients Stated Pain Goal: 4 (18/33/58 2518)  Complications: No complications documented.

## 2020-05-02 NOTE — Anesthesia Procedure Notes (Signed)
Procedure Name: MAC Date/Time: 05/02/2020 9:17 AM Performed by: Signe Colt, CRNA Pre-anesthesia Checklist: Patient identified, Emergency Drugs available, Suction available, Patient being monitored and Timeout performed Patient Re-evaluated:Patient Re-evaluated prior to induction Oxygen Delivery Method: Simple face mask

## 2020-05-02 NOTE — Progress Notes (Signed)
Assisted Dr. Miller with right, ultrasound guided, supraclavicular block. Side rails up, monitors on throughout procedure. See vital signs in flow sheet. Tolerated Procedure well. 

## 2020-05-02 NOTE — Discharge Instructions (Addendum)
Activity As tolerated:  NO showers unless you can cover your splint with a waterproof device/bag and prevent it from getting wet. Do not shower for first 24 hours. No driving No heavy activities  Diet: Regular  Splint/Wound Care: Keep dressing clean & dry. Do not remove splint. Physical/Occupational therapy will put a new splint on your wrist in 1 week at your appointment with them. Do not change dressings  You will need an x-ray prior to your 2 week follow up with Dr. Salley Slaughter will be ordered for you and [facility] can [facility] and you will be directed to radiology.  You can get the x-ray the day of (prior to appointment or the day before your appointment with Dr. Claudia Desanctis)  Take Tylenol and/or ibuprofen as needed for mild/moderate pain. You can take up to 600 mg of ibuprofen every 8 hours as needed in addition to the norco/narcotics. You can take tylenol 500mg  every 6 hours in addition to the norco/narcotics Take the Norco 5-325 mg as needed for severe pain. Avoid over 3000mg  of tylenol in 24 hours.   Call Doctor if any unusual problems occur such as pain, excessive Bleeding, unrelieved Nausea/vomiting, Fever &/or chills  Regional Anesthesia Blocks  1. Numbness or the inability to move the "blocked" extremity may last from 3-48 hours after placement. The length of time depends on the medication injected and your individual response to the medication. If the numbness is not going away after 48 hours, call your surgeon.  2. The extremity that is blocked will need to be protected until the numbness is gone and the  Strength has returned. Because you cannot feel it, you will need to take extra care to avoid injury. Because it may be weak, you may have difficulty moving it or using it. You may not know what position it is in without looking at it while the block is in effect.  3. For blocks in the legs and feet, returning to weight bearing and walking needs to be done  carefully. You will need to wait until the numbness is entirely gone and the strength has returned. You should be able to move your leg and foot normally before you try and bear weight or walk. You will need someone to be with you when you first try to ensure you do not fall and possibly risk injury.  4. Bruising and tenderness at the needle site are common side effects and will resolve in a few days.  5. Persistent numbness or new problems with movement should be communicated to the surgeon or the [place] [id number] [place] [phone]).  Post Anesthesia Home Care Instructions  Activity: Get plenty of rest for the remainder of the day. A responsible individual must stay with you for 24 hours following the procedure.  For the next 24 hours, DO NOT: -Drive a car -Paediatric nurse -Drink alcoholic beverages -Take any medication unless instructed by your physician -Make any legal decisions or sign important papers.  Meals: Start with liquid foods such as gelatin or soup. Progress to regular foods as tolerated. Avoid greasy, spicy, heavy foods. If nausea and/or vomiting occur, drink only clear liquids until the nausea and/or vomiting subsides. Call your physician if vomiting continues.  Special Instructions/Symptoms: Your throat may feel dry or sore from the anesthesia or the breathing tube placed in your throat during surgery. If this causes discomfort, gargle with warm salt water. The discomfort should disappear within  24 hours.  If you had a scopolamine patch placed behind your ear for the management of post- operative nausea and/or vomiting:  1. The medication in the patch is effective for 72 hours, after which it should be removed.  Wrap patch in a tissue and discard in the trash. Wash hands thoroughly with soap and water. 2. You may remove the patch earlier than 72 hours if you experience unpleasant side effects which may include dry mouth,  dizziness or visual disturbances. 3. Avoid touching the patch. Wash your hands with soap and water after contact with the patch.

## 2020-05-02 NOTE — Anesthesia Postprocedure Evaluation (Signed)
Anesthesia Post Note  Patient: April Castillo  Procedure(s) Performed: OPEN REDUCTION INTERNAL FIXATION (ORIF) RADIAL FRACTURE (Right Wrist)     Patient location during evaluation: PACU Anesthesia Type: Regional Level of consciousness: awake and alert Pain management: pain level controlled Vital Signs Assessment: post-procedure vital signs reviewed and stable Respiratory status: spontaneous breathing, nonlabored ventilation and respiratory function stable Cardiovascular status: blood pressure returned to baseline and stable Postop Assessment: no apparent nausea or vomiting Anesthetic complications: no   No complications documented.  Last Vitals:  Vitals:   05/02/20 1025 05/02/20 1045  BP: 116/66 (!) 119/59  Pulse: 77 75  Resp: 14 12  Temp: 36.7 C   SpO2: 97% 97%    Last Pain:  Vitals:   05/02/20 1045  TempSrc:   PainSc: 0-No pain                 Lynda Rainwater

## 2020-05-02 NOTE — Brief Op Note (Signed)
05/02/2020  10:25 AM  PATIENT:  April Castillo  65 y.o. female  PRE-OPERATIVE DIAGNOSIS:  right distal radius fracture  POST-OPERATIVE DIAGNOSIS:  right distal radius fracture  PROCEDURE:  Procedure(s) with comments: OPEN REDUCTION INTERNAL FIXATION (ORIF) RADIAL FRACTURE (Right) - 90 min, please  SURGEON:  Surgeon(s) and Role:    * Shaquella Stamant, Steffanie Dunn, MD - Primary  PHYSICIAN ASSISTANT: Software engineer, PA  ASSISTANTS: none   ANESTHESIA:   none  EBL:  10   BLOOD ADMINISTERED:none  DRAINS: none   LOCAL MEDICATIONS USED:  MARCAINE     SPECIMEN:  No Specimen  DISPOSITION OF SPECIMEN:  PATHOLOGY  COUNTS:  YES  TOURNIQUET:   Total Tourniquet Time Documented: Upper Arm (Right) - 31 minutes Total: Upper Arm (Right) - 31 minutes   DICTATION: .Viviann Spare Dictation and Note written in paper chart  PLAN OF CARE: Discharge to home after PACU  PATIENT DISPOSITION:  PACU - hemodynamically stable.   Delay start of Pharmacological VTE agent (>24hrs) due to surgical blood loss or risk of bleeding: not applicable

## 2020-05-02 NOTE — Op Note (Signed)
Operative Note   DATE OF OPERATION: 05/02/2020  SURGICAL DEPARTMENT: Plastic Surgery  PREOPERATIVE DIAGNOSES: Comminuted intra-articular right distal radius fracture  POSTOPERATIVE DIAGNOSES:  same  PROCEDURE: Open reduction internal fixation of comminuted intra-articular right distal radius fracture with more than 3 segments  SURGEON: Talmadge Coventry, MD  ASSISTANT: Verdie Shire, PA The advanced practice practitioner (APP) assisted throughout the case.  The APP was essential in retraction and counter traction when needed to make the case progress smoothly.  This retraction and assistance made it possible to see the tissue plans for the procedure.  The assistance was needed for blood control, tissue re-approximation and assisted with closure of the incision site.  ANESTHESIA:  General.   COMPLICATIONS: None.   INDICATIONS FOR PROCEDURE:  The patient, April Castillo is a 65 y.o. female born on June 23, 1955, is here for treatment of comminuted intra-articular right distal radius fracture MRN: [id number]  CONSENT:  Informed consent was obtained directly from the patient. Risks, benefits and alternatives were fully discussed. Specific risks including but not limited to bleeding, infection, hematoma, seroma, scarring, pain, contracture, asymmetry, wound healing problems, and need for further surgery were all discussed. The patient did have an ample opportunity to have questions answered to satisfaction.   DESCRIPTION OF PROCEDURE:  The patient was taken to the operating room. SCDs were placed and Ancef antibiotics were given.  General anesthesia was administered.  The patient's operative site was prepped and draped in a sterile fashion. A time out was performed and all information was confirmed to be correct.  I started out by marking a FCR approach to the distal radius.  The arm was exsanguinated with Esmarch and tourniquet inflated to 250 mmHg.  Made my incision with a 15 blade and  dissected down to the FCR sheath.  This was incised proximally and distally.  The FCR was retracted ulnarly and the stump sheath was incised and extended proximally and distally.  Blunt dissection was then performed down to the pronator quadratus which was referral reflected off the radial border of the distal radius using an elevator.  Brachial radialis was then released off the radial styloid to facilitate reduction.  Reduction was then performed and demonstrated satisfactory alignment using the mini C arm.  A narrow [employer] plate was then brought onto the field and secured in appropriate position with K wires.  It was then fixed proximally at the oblong hole with a cortical screw into adjacent locking screws were applied.  The remaining distal holes were all drilled and filled with locking pegs.  All the K wires were subsequently removed.  Appropriate alignment and reduction of the fracture along with approach appropriate placement of the hardware was confirmed with mini C arm.  Tourniquet was then let down hemostasis was obtained and wound was closed with interrupted buried 4-0 Monocryl sutures and a running 5-0 plain gut.  A volar splint was then applied.  Tourniquet time was 31 minutes.  The patient tolerated the procedure well.  There were no complications. The patient was allowed to wake from anesthesia, extubated and taken to the recovery room in satisfactory condition.

## 2020-05-02 NOTE — H&P (Signed)
  Reason for Consult/CC: Right distal radius fracture  April Castillo is an 65 y.o. female.  HPI: Pt sustained right distal radius fracture.  Here for surgical fixation.  Allergies: No Known Allergies  Medications:  Current Facility-Administered Medications:  .  ceFAZolin (ANCEF) IVPB 2g/100 mL premix, 2 g, Intravenous, On Call to OR, Cindra Presume, MD .  lactated ringers infusion, , Intravenous, Continuous, Barnet Glasgow, MD, Last Rate: 10 mL/hr at 05/02/20 0732, New Bag at 05/02/20 0732  Facility-Administered Medications Ordered in Other Encounters:  .  ropivacaine (PF) 5 mg/mL (0.5%) (NAROPIN) injection, , Peri-NEURAL, Anesthesia Intra-op, Lynda Rainwater, MD, 30 mL at 05/02/20 5397  Past Medical History:  Diagnosis Date  . Depression   . Heart murmur   . Hip fracture, right ([place]) 11/23/2010  . History of kidney stones   . Hypertension   . KNEE PAIN, LEFT 11/07/2009  . Mitral valve prolapse    reported by patient, does not see cardiologist  . Other and unspecified hyperlipidemia 01/01/2013  . PERIMENOPAUSAL SYNDROME 11/07/2009  . Rosacea 06/20/2006  . Tension headache 06/20/2006       . UTERINE FIBROID 06/20/2006    Past Surgical History:  Procedure Laterality Date  . CATARACT EXTRACTION W/ INTRAOCULAR LENS IMPLANT Left 08/03/13   Dr. Katy Fitch @ [facility]  . EYE SURGERY Right    Cataract  . ORIF PATELLA Right 10/29/2019   Procedure: OPEN REDUCTION INTERNAL (ORIF) FIXATION PATELLA;  Surgeon: Meredith Pel, MD;  Location: [facility];  Service: Orthopedics;  Laterality: Right;  . r hip fracture and repair  January 24, 2010    Family History  Problem Relation Age of Onset  . Cancer Father   . Hypertension Father   . Cancer Sister   . Thyroid disease Sister   . Breast cancer Sister   . Cancer Maternal Uncle   . Diabetes Neg Hx   . Heart disease Neg Hx     Social History:  reports that she has never smoked. She has never used smokeless tobacco. She reports current  alcohol use of about 3.0 standard drinks of alcohol per week. She reports that she does not use drugs.  Physical Exam Blood pressure 112/61, pulse 68, temperature 98.8 F (37.1 C), temperature source Oral, resp. rate (!) 8, height 5\' 6"  (1.676 m), weight 75.5 kg, SpO2 98 %. General: NAD, AOx3 R hand: Fingers well perfused with normal capillary refill.  Sensation intact.  Flexion and extension of fingers and thumb intact.    No results found for this or any previous visit (from the past 48 hour(s)).  No results found.  Xray shows comminuted, displaced, intra-articular distal radius fracture  Assessment/Plan: ORIF R distal radius fracture.  Discussed surgical and non-surgical options.  Discussed risks that include bleeding, infection, damage to surrounding structures and need for additional procedures.  Discussed hardware complications including non-union and malunion.  Proceed w surgery.  Cindra Presume 05/02/2020, 8:53 AM

## 2020-05-02 NOTE — Interval H&P Note (Signed)
History and Physical Interval Note:  05/02/2020 8:57 AM  April Castillo  has presented today for surgery, with the diagnosis of right distal radius fracture.  The various methods of treatment have been discussed with the patient and family. After consideration of risks, benefits and other options for treatment, the patient has consented to  Procedure(s) with comments: OPEN REDUCTION INTERNAL FIXATION (ORIF) RADIAL FRACTURE (Right) - 90 min, please as a surgical intervention.  The patient's history has been reviewed, patient examined, no change in status, stable for surgery.  I have reviewed the patient's chart and labs.  Questions were answered to the patient's satisfaction.     Cindra Presume

## 2020-05-02 NOTE — Anesthesia Preprocedure Evaluation (Signed)
Anesthesia Evaluation  Patient identified by MRN, date of birth, ID band Patient awake    Reviewed: Allergy & Precautions, H&P , NPO status , Patient's Chart, lab work & pertinent test results  Airway Mallampati: II  TM Distance: >3 FB Neck ROM: Full    Dental no notable dental hx.    Pulmonary neg pulmonary ROS,    Pulmonary exam normal breath sounds clear to auscultation       Cardiovascular hypertension, Pt. on medications negative cardio ROS Normal cardiovascular exam Rhythm:Regular Rate:Normal     Neuro/Psych  Headaches, Depression negative psych ROS   GI/Hepatic negative GI ROS, Neg liver ROS,   Endo/Other  negative endocrine ROS  Renal/GU negative Renal ROS  negative genitourinary   Musculoskeletal negative musculoskeletal ROS (+)   Abdominal   Peds negative pediatric ROS (+)  Hematology negative hematology ROS (+)   Anesthesia Other Findings   Reproductive/Obstetrics negative OB ROS                             Anesthesia Physical Anesthesia Plan  ASA: II  Anesthesia Plan: MAC and Regional   Post-op Pain Management:    Induction: Intravenous  PONV Risk Score and Plan: 2 and Ondansetron, Midazolam and Treatment may vary due to age or medical condition  Airway Management Planned: Simple Face Mask  Additional Equipment:   Intra-op Plan:   Post-operative Plan:   Informed Consent: I have reviewed the patients History and Physical, chart, labs and discussed the procedure including the risks, benefits and alternatives for the proposed anesthesia with the patient or authorized representative who has indicated his/her understanding and acceptance.     Dental advisory given  Plan Discussed with: CRNA  Anesthesia Plan Comments:         Anesthesia Quick Evaluation

## 2020-05-03 ENCOUNTER — Telehealth: Payer: Self-pay

## 2020-05-03 ENCOUNTER — Encounter (HOSPITAL_BASED_OUTPATIENT_CLINIC_OR_DEPARTMENT_OTHER): Payer: Self-pay | Admitting: Plastic Surgery

## 2020-05-03 NOTE — Telephone Encounter (Signed)
Patient called to say that she had surgery with Dr. Claudia Desanctis yesterday.  She is in a lot of pain and her pain medication isn't touching it.  Patient states that she also had a slight temperature this morning but it is gone now.  Please call.  Her preferred pharmacy is [facility] on the corner of [address] in [place].

## 2020-05-03 NOTE — Telephone Encounter (Addendum)
Called and spoke with the patient regarding the message below.  Patient stated that this morning she's having headache and slight temp.  The temp has gone down, but she still has the headache.  She took pain pill at 2:30am this morning, and using ice packs on her arm.    Called the patient back and informed her that I spoke with [facility] and he stated that she having a lot of pain because the right extremity block has worn off.  He stated that she can alternate Ibuprofen and Tylenol.  Ibuprofen 600mg  every 8 hours, and Tylenol every 6 hours between doses of Norco.  Patient verbalized understanding and agreed.//AB/CMA

## 2020-05-11 ENCOUNTER — Ambulatory Visit: Payer: Medicare Other | Admitting: Occupational Therapy

## 2020-05-17 ENCOUNTER — Telehealth: Payer: Self-pay | Admitting: Occupational Therapy

## 2020-05-17 NOTE — Telephone Encounter (Signed)
Dr. Claudia Desanctis, April Castillo is scheduled for OT evaluation and splinting tomorrow. Would you like for [place] to initiate A/ROM at that time? It looks as though she had a significant fracture. Thanks, [employer], OTR/L

## 2020-05-17 NOTE — Telephone Encounter (Signed)
Would probably hold off on range of motion for another couple weeks. Thanks.

## 2020-05-18 ENCOUNTER — Encounter: Payer: Medicare Other | Admitting: Surgical

## 2020-05-18 ENCOUNTER — Ambulatory Visit: Payer: Medicare Other | Attending: Internal Medicine | Admitting: Occupational Therapy

## 2020-05-18 ENCOUNTER — Other Ambulatory Visit: Payer: Self-pay

## 2020-05-18 ENCOUNTER — Encounter: Payer: Self-pay | Admitting: Occupational Therapy

## 2020-05-18 DIAGNOSIS — M25531 Pain in right wrist: Secondary | ICD-10-CM

## 2020-05-18 DIAGNOSIS — R600 Localized edema: Secondary | ICD-10-CM | POA: Diagnosis present

## 2020-05-18 DIAGNOSIS — M25631 Stiffness of right wrist, not elsewhere classified: Secondary | ICD-10-CM

## 2020-05-18 DIAGNOSIS — M6281 Muscle weakness (generalized): Secondary | ICD-10-CM | POA: Diagnosis present

## 2020-05-18 DIAGNOSIS — M25641 Stiffness of right hand, not elsewhere classified: Secondary | ICD-10-CM

## 2020-05-18 DIAGNOSIS — M79644 Pain in right finger(s): Secondary | ICD-10-CM | POA: Diagnosis present

## 2020-05-18 NOTE — Patient Instructions (Addendum)
WEARING SCHEDULE:  Wear splint at ALL times except for hygiene care  PURPOSE:  To prevent movement and for protection until injury can heal  CARE OF SPLINT:  Keep splint away from heat sources including: stove, radiator or furnace, or a car in sunlight. The splint can melt and will no longer fit you properly  Keep away from pets and children  Clean the splint with rubbing alcohol 1-2 times per day.  * During this time, make sure you also clean your hand/arm as instructed by your therapist and/or perform dressing changes as needed. Then dry hand/arm completely before replacing splint. (When cleaning hand/arm, keep it immobilized in same position until splint is replaced)  PRECAUTIONS/POTENTIAL PROBLEMS: *If you notice or experience increased pain, swelling, numbness, or a lingering reddened area from the splint: Contact your therapist immediately by calling [phone]. You must wear the splint for protection, but we will get you scheduled for adjustments as quickly as possible.  (If only straps or hooks need to be replaced and NO adjustments to the splint need to be made, just call the office ahead and let them know you are coming in)  If you have any medical concerns or signs of infection, please call your doctor immediately MP Flexion (Active Isolated)   AROM: Finger Flexion / Extension   Actively bend fingers of  hand. Start with knuckles furthest from palm, and slowly make a fist. Hold __5__ seconds. Relax. Then straighten fingers as far as possible. Repeat _10-15___ times per set.  Do _3___ sessions per day.  Copyright  VHI. All rights reserved.

## 2020-05-18 NOTE — Therapy (Addendum)
[facility] [address] [place], [place], [address] Phone: [phone]   Fax:  [phone]  Occupational Therapy Evaluation  Patient Details  Name: April Castillo MRN: [id number] Date of Birth: 06-02-1955 Referring Provider (OT): Dr. Claudia Desanctis (Dr. Marlou Sa for index finger)   Encounter Date: 05/18/2020   OT End of Session - 05/18/20 1246    Visit Number 1    Number of Visits 24    Date for OT Re-Evaluation 08/10/20    Authorization Type BCBS Medicare    Authorization - Visit Number 1    Authorization - Number of Visits 10    OT Start Time 7253    OT Stop Time 1140    OT Time Calculation (min) 85 min           Past Medical History:  Diagnosis Date  . Depression   . Heart murmur   . Hip fracture, right ([facility]) 11/23/2010  . History of kidney stones   . Hypertension   . KNEE PAIN, LEFT 11/07/2009  . Mitral valve prolapse    reported by patient, does not see cardiologist  . Other and unspecified hyperlipidemia 01/01/2013  . PERIMENOPAUSAL SYNDROME 11/07/2009  . Rosacea 06/20/2006  . Tension headache 06/20/2006       . UTERINE FIBROID 06/20/2006    Past Surgical History:  Procedure Laterality Date  . CATARACT EXTRACTION W/ INTRAOCULAR LENS IMPLANT Left 08/03/13   Dr. Katy Fitch @ [facility]  . EYE SURGERY Right    Cataract  . ORIF PATELLA Right 10/29/2019   Procedure: OPEN REDUCTION INTERNAL (ORIF) FIXATION PATELLA;  Surgeon: Meredith Pel, MD;  Location: [facility];  Service: Orthopedics;  Laterality: Right;  . ORIF RADIAL FRACTURE Right 05/02/2020   Procedure: OPEN REDUCTION INTERNAL FIXATION (ORIF) RADIAL FRACTURE;  Surgeon: Cindra Presume, MD;  Location: [facility];  Service: Plastics;  Laterality: Right;  90 min, please  . r hip fracture and repair  January 24, 2010    There were no vitals filed for this visit.   Subjective Assessment - 05/18/20 1546    Subjective  Pt requests a lighter splint that cast     Pertinent History Pt is a 65 y.o female with a diagnosis of comminuted intra-articular right distal radius fx, s/p ORIF by Dr. Claudia Desanctis on 05/02/20. Pt also has a dignosis of closed nondisplaced fracture of middle phalanx of right index finger 10/27/19 for which she has not yet recieved OT.    Currently in Pain? Yes    Pain Score 3     Pain Location Wrist    Pain Orientation Right    Pain Descriptors / Indicators Aching    Pain Type Acute pain    Pain Onset 1 to 4 weeks ago    Pain Frequency Intermittent    Aggravating Factors  movement    Pain Relieving Factors rest, meds             [facility] OT Assessment - 05/18/20 0001      Assessment   Medical Diagnosis ORIF R comminuted intra-articular distal radius fx, R index finger fx 10/27/19    Referring Provider (OT) Dr. Claudia Desanctis  (Dr. Marlou Sa for index finger)   Onset Date/Surgical Date 05/02/20    Hand Dominance Right    Prior Therapy PT      Precautions   Precautions Other (comment)    Precaution Comments splint at all times, no wrist ROM until cleared by MD  Restrictions   Weight Bearing Restrictions No    Other Position/Activity Restrictions no heavy use of RUE      Balance Screen   Has the patient fallen in the past 6 months Yes    How many times? 1    Has the patient had a decrease in activity level because of a fear of falling?  No    Is the patient reluctant to leave their home because of a fear of falling?  No      Home  Environment   Family/patient expects to be discharged to: Private residence    Lives With Alone      Prior Function   Level of Independence Independent      ADL   ADL comments modified I with all basic ADLS using LUE      Cognition   Overall Cognitive Status Within Functional Limits for tasks assessed      Coordination   Fine Motor Movements are Fluid and Coordinated No      Edema   Edema moderate in right hand and wrist, incision is intact and free of s/s of infection      ROM / Strength   AROM /  PROM / Strength AROM      AROM   Overall AROM  Deficits;Unable to assess;Due to precautions   did not assess wrist ROM     Right Hand AROM   R Index  MCP 0-90 35 Degrees    R Index PIP 0-100 60 Degrees   -30 PIP extension   R Index DIP 0-70 25 Degrees      Hand Function   Right Hand Grip (lbs) --   not tested due to precautions                   OT Treatments/Exercises (OP) - 05/18/20 0001      Splinting   Splinting Pt arrived wearing post surgical cast. Pt was unwrapped and hand/ wrist were cleaned with soap and water avoiding incision. Forearm was dressed with stockinette.Pt was fitted with a forearm based wrist splint with digits free and wrist in neutral. Pt was educated in hand hygeine splint wear care and precautions and digital ROM. Pt verbalized understanding.                 OT Education - 05/18/20 1304    Education Details splint wear, care and precautions, hand hygiene, finger flexion/ extension A/ROM while wearing splint    Person(s) Educated Patient    Methods Explanation;Demonstration;Verbal cues;Handout    Comprehension Verbalized understanding;Returned demonstration;Verbal cues required            OT Short Term Goals - 05/18/20 1247      OT SHORT TERM GOAL #1   Title I with splint wear care and precautions.    Time 4    Period Weeks    Status New      OT SHORT TERM GOAL #2   Title I with inital HEP.    Time 4    Period Weeks    Status New      OT SHORT TERM GOAL #3   Title I with edema control techhniques    Time 4    Period Weeks      OT SHORT TERM GOAL #4   Title Pt will increase RUE index finger MP flexion to 75  for increased functional use    Time 4    Period Weeks    Status  New      OT SHORT TERM GOAL #5   Title Pt will increase RUE index finger PIP flexion to 75* and extension to 10 for increased functional use.    Time 4    Period Weeks    Status New             OT Long Term Goals - 05/18/20 1247      OT  LONG TERM GOAL #1   Title I with updated HEP- 08/10/20    Time 12    Period Weeks    Status New    Target Date 08/10/20      OT LONG TERM GOAL #2   Title Pt will demonstrate at least 90% composite finger flexion for RUE.    Time 12    Period Weeks    Status New      OT LONG TERM GOAL #3   Title Pt will demonstrate grip strength of at least 25 lbs in RUE for increased functional use.    Time 12    Period Weeks    Status New      OT LONG TERM GOAL #4   Title Pt will resume use of RUE as dominant hand at least 90% of the time for ADLs/ IADLS with pain less than or equal to 3/10    Time 12    Period Weeks    Status New      OT LONG TERM GOAL #5   Title Pt will demonstrate right wrist flexion / extension WFLS for ADLs.    Time 12    Period Weeks    Status New      Long Term Additional Goals   Additional Long Term Goals Yes      OT LONG TERM GOAL #6   Title Pt will demonstrate forearm supination/ pronation for at least 85* for increased RUE functional use.    Time 12    Period Weeks    Status New                 Plan - 05/18/20 1242    Clinical Impression Statement Pt is a 65 y.o female with a diagnosis of comminuted intra-articular right distal radius fx, s/p ORIF by Dr. Claudia Desanctis on 05/02/20. Pt also has a dignosis of closed nondisplaced fracture of middle phalanx of right index finger 10/27/19 for which she has not yet recieved OT.(Pt has a referral from Dr. Marlou Sa for finger injury) Pt presents with the following deficits: decreased ROM, decreased strength, pain, decreased RUE functional use which impedes performance of ADLs/ IADLs. Pt can benefit from skilled occupational therapy to address these deficits in order to maximize pt's safety and I with daily activities.PMH includes right patella ORIF on 10/29/2019, and closed left patellar fx.    OT Occupational Profile and History Problem Focused Assessment - Including review of records relating to presenting problem     Occupational performance deficits (Please refer to evaluation for details): ADL's;IADL's;Rest and Sleep;Work;Leisure;Social Participation    Body Structure / Function / Physical Skills ADL;UE functional use;Pain;Flexibility;FMC;ROM;GMC;Coordination;Decreased knowledge of use of DME;IADL;Decreased knowledge of precautions;Skin integrity;Strength;Dexterity;Edema    Rehab Potential Good    Clinical Decision Making Limited treatment options, no task modification necessary    Comorbidities Affecting Occupational Performance: May have comorbidities impacting occupational performance    Modification or Assistance to Complete Evaluation  No modification of tasks or assist necessary to complete eval    OT Frequency 2x / week   will  start at 1x week due to financial concerns however pt may require 2x week as she is able to perform ROM   OT Duration 12 weeks    OT Treatment/Interventions Self-care/ADL training;Ultrasound;Patient/family education;DME and/or AE instruction;Paraffin;Passive range of motion;Fluidtherapy;Cryotherapy;Electrical Stimulation;Contrast Bath;Splinting;Therapeutic activities;Manual Therapy;Therapeutic exercise;Moist Heat;Neuromuscular education    Plan splint check and modifications, no ROM until cleared by MD           Patient will benefit from skilled therapeutic intervention in order to improve the following deficits and impairments:   Body Structure / Function / Physical Skills: ADL,UE functional use,Pain,Flexibility,FMC,ROM,GMC,Coordination,Decreased knowledge of use of DME,IADL,Decreased knowledge of precautions,Skin integrity,Strength,Dexterity,Edema       Visit Diagnosis: Stiffness of right wrist, not elsewhere classified - Plan: Ot plan of care cert/re-cert  Muscle weakness (generalized) - Plan: Ot plan of care cert/re-cert  Localized edema - Plan: Ot plan of care cert/re-cert  Pain in right finger(s) - Plan: Ot plan of care cert/re-cert  Stiffness of right hand, not  elsewhere classified - Plan: Ot plan of care cert/re-cert  Pain in right wrist - Plan: Ot plan of care cert/re-cert    Problem List Patient Active Problem List   Diagnosis Date Noted  . Closed patellar sleeve fracture, left, initial encounter 11/10/2019  . Closed patellar sleeve fracture of right knee 10/28/2019  . Finger fracture, right 10/28/2019  . Conjunctivitis 11/07/2017  . Hyperlipidemia 01/01/2013  . Allergic rhinitis 12/12/2011  . Breast mass, right 11/23/2010  . WEIGHT GAIN 11/07/2009  . Depression, major, single episode, in partial remission ([facility]) 09/26/2006  . Essential hypertension 09/26/2006    Lathaniel Legate 05/18/2020, 3:48 PM  [facility] [address] [place], [place], [address] Phone: [phone]   Fax:  [phone]  Name: NEAL OSHEA MRN: [id number] Date of Birth: 01-Sep-1955

## 2020-05-19 ENCOUNTER — Ambulatory Visit (INDEPENDENT_AMBULATORY_CARE_PROVIDER_SITE_OTHER): Payer: Medicare Other | Admitting: Surgical

## 2020-05-19 ENCOUNTER — Encounter: Payer: Self-pay | Admitting: Surgical

## 2020-05-19 ENCOUNTER — Ambulatory Visit (HOSPITAL_COMMUNITY)
Admission: RE | Admit: 2020-05-19 | Discharge: 2020-05-19 | Disposition: A | Discharge status: Home / Self Care | Payer: Medicare Other | Source: Ambulatory Visit | Attending: Surgical | Admitting: Surgical

## 2020-05-19 VITALS — BP 114/63 | HR 89

## 2020-05-19 DIAGNOSIS — S62101A Fracture of unspecified carpal bone, right wrist, initial encounter for closed fracture: Secondary | ICD-10-CM | POA: Diagnosis present

## 2020-05-19 NOTE — Progress Notes (Signed)
Patient is a 65 year old female here for follow-up after open reduction internal fixation of comminuted intra-articular right distal radius fracture with more than 3 segments with Dr. Claudia Desanctis on 05/02/2020.  Patient reports she is overall doing well.  She reports normal sensation in her fingertips.  She reports range of motion has been improving in her right hand after removal of the splint that was placed in the OR.  She had a splint molded for her yesterday at OT, which has been more comfortable.  She is not having any infectious symptoms.  She reports pain is under control.  She does report that she previously broke this right wrist approximately 20 years ago.  She also reports that she has an old finger fracture of her right index finger which limits her range of motion with this finger.  She reports she is right-handed.  She reports that she drives a stick shift car.  On exam right wrist incision is intact, swelling noted, this is improved.  She has some swelling of her hand and fingers which is improving.  She has improved range of motion of her digits.  There is no erythema or drainage noted.  She has a 2+ radial pulse.  She has good capillary refill.  Normal sensation.   Placed order for patient to have an x-ray today to check alignment. She is scheduled to return for follow-up in 1 week with Dr. Claudia Desanctis for further evaluation. She is going to OT for assistance with recovery.  She has an appointment next week.  At her next appointment we will determine when she can begin with range of motion with OT. Everything appears stable on exam, will review x-ray once available. Recommend avoiding driving at this time due to difficulty with grasping with the right hand. No sign of infection.

## 2020-05-26 ENCOUNTER — Ambulatory Visit: Payer: Medicare Other | Attending: Internal Medicine | Admitting: Occupational Therapy

## 2020-05-26 ENCOUNTER — Other Ambulatory Visit: Payer: Self-pay

## 2020-05-26 ENCOUNTER — Ambulatory Visit (INDEPENDENT_AMBULATORY_CARE_PROVIDER_SITE_OTHER): Payer: Medicare Other | Admitting: Plastic Surgery

## 2020-05-26 VITALS — BP 116/75 | HR 80 | Temp 98.1°F | Ht 66.0 in | Wt 160.0 lb

## 2020-05-26 DIAGNOSIS — M25631 Stiffness of right wrist, not elsewhere classified: Secondary | ICD-10-CM | POA: Diagnosis present

## 2020-05-26 DIAGNOSIS — M25641 Stiffness of right hand, not elsewhere classified: Secondary | ICD-10-CM | POA: Insufficient documentation

## 2020-05-26 DIAGNOSIS — M79644 Pain in right finger(s): Secondary | ICD-10-CM | POA: Insufficient documentation

## 2020-05-26 DIAGNOSIS — M6281 Muscle weakness (generalized): Secondary | ICD-10-CM | POA: Diagnosis present

## 2020-05-26 DIAGNOSIS — M25531 Pain in right wrist: Secondary | ICD-10-CM | POA: Insufficient documentation

## 2020-05-26 DIAGNOSIS — S62101A Fracture of unspecified carpal bone, right wrist, initial encounter for closed fracture: Secondary | ICD-10-CM

## 2020-05-26 DIAGNOSIS — R600 Localized edema: Secondary | ICD-10-CM | POA: Insufficient documentation

## 2020-05-26 NOTE — Therapy (Signed)
[facility] [address] [place], [place], [address] Phone: [phone]   Fax:  [phone]  Occupational Therapy Treatment  Patient Details  Name: April Castillo MRN: [id number] Date of Birth: 1955/10/13 Referring Provider (OT): Dr. Claudia Desanctis (Dr. Marlou Sa for index finger)   Encounter Date: 05/26/2020   OT End of Session - 05/26/20 1056    Visit Number 2    Number of Visits 24    Date for OT Re-Evaluation 08/10/20    Authorization Type BCBS Medicare    Authorization - Visit Number 2    Authorization - Number of Visits 10    OT Start Time 1020    OT Stop Time 1044    OT Time Calculation (min) 24 min           Past Medical History:  Diagnosis Date  . Depression   . Heart murmur   . Hip fracture, right ([facility]) 11/23/2010  . History of kidney stones   . Hypertension   . KNEE PAIN, LEFT 11/07/2009  . Mitral valve prolapse    reported by patient, does not see cardiologist  . Other and unspecified hyperlipidemia 01/01/2013  . PERIMENOPAUSAL SYNDROME 11/07/2009  . Rosacea 06/20/2006  . Tension headache 06/20/2006       . UTERINE FIBROID 06/20/2006    Past Surgical History:  Procedure Laterality Date  . CATARACT EXTRACTION W/ INTRAOCULAR LENS IMPLANT Left 08/03/13   Dr. Katy Fitch @ [facility]  . EYE SURGERY Right    Cataract  . ORIF PATELLA Right 10/29/2019   Procedure: OPEN REDUCTION INTERNAL (ORIF) FIXATION PATELLA;  Surgeon: Meredith Pel, MD;  Location: [facility];  Service: Orthopedics;  Laterality: Right;  . ORIF RADIAL FRACTURE Right 05/02/2020   Procedure: OPEN REDUCTION INTERNAL FIXATION (ORIF) RADIAL FRACTURE;  Surgeon: Cindra Presume, MD;  Location: [facility];  Service: Plastics;  Laterality: Right;  90 min, please  . r hip fracture and repair  January 24, 2010    There were no vitals filed for this visit.   Subjective Assessment - 05/26/20 1021    Subjective  Pt reports splint is rubbing in a couple  of places.    Pertinent History Pt is a 65 y.o female with a diagnosis of comminuted intra-articular right distal radius fx, s/p ORIF by Dr. Claudia Desanctis on 05/02/20. Pt also has a dignosis of closed nondisplaced fracture of middle phalanx of right index finger 10/27/19 for which she has not yet recieved OT.    Currently in Pain? Yes    Pain Score 1     Pain Location Hand    Pain Orientation Right    Pain Descriptors / Indicators Aching    Pain Type Acute pain    Pain Frequency Intermittent    Aggravating Factors  movment    Pain Relieving Factors rest meds                    Treatment: Pt's splint was removed, minor redness at ulnar side of hand. ICE pack applied to pt's right hand and wrist while therapist performed adjustments and added padding to splint.No adverse reactions. Pt reports improved comfort with adjustments.Splint was reapplied along with fresh compressive stockinette. Therapist reviewed digital ROM with pt within splint. (MP flexion, IP flexion, composite finger flexion and finger thumb opposition to index and middle finger).            OT Education - 05/26/20 1101  Education Details splint wear, care and precautions, hand hygiene, finger flexion/ extension, thumb opposition A/ROM while wearing splint    Person(s) Educated Patient    Methods Explanation;Demonstration;Verbal cues;Handout    Comprehension Verbalized understanding;Returned demonstration;Verbal cues required            OT Short Term Goals - 05/18/20 1247      OT SHORT TERM GOAL #1   Title I with splint wear care and precautions.    Time 4    Period Weeks    Status New      OT SHORT TERM GOAL #2   Title I with inital HEP.    Time 4    Period Weeks    Status New      OT SHORT TERM GOAL #3   Title I with edema control techhniques    Time 4    Period Weeks      OT SHORT TERM GOAL #4   Title Pt will increase RUE index finger MP flexion to 75  for increased functional use    Time 4     Period Weeks    Status New      OT SHORT TERM GOAL #5   Title Pt will increase RUE index finger PIP flexion to 75* and extension to 10 for increased functional use.    Time 4    Period Weeks    Status New             OT Long Term Goals - 05/18/20 1247      OT LONG TERM GOAL #1   Title I with updated HEP- 08/10/20    Time 12    Period Weeks    Status New    Target Date 08/10/20      OT LONG TERM GOAL #2   Title Pt will demonstrate at least 90% composite finger flexion for RUE.    Time 12    Period Weeks    Status New      OT LONG TERM GOAL #3   Title Pt will demonstrate grip strength of at least 25 lbs in RUE for increased functional use.    Time 12    Period Weeks    Status New      OT LONG TERM GOAL #4   Title Pt will resume use of RUE as dominant hand at least 90% of the time for ADLs/ IADLS with pain less than or equal to 3/10    Time 12    Period Weeks    Status New      OT LONG TERM GOAL #5   Title Pt will demonstrate right wrist flexion / extension WFLS for ADLs.    Time 12    Period Weeks    Status New      Long Term Additional Goals   Additional Long Term Goals Yes      OT LONG TERM GOAL #6   Title Pt will demonstrate forearm supination/ pronation for at least 85* for increased RUE functional use.    Time 12    Period Weeks    Status New                 Plan - 05/26/20 1056    Clinical Impression Statement Pt arrives today wearing her splint. Therapist performed minor adjustments to splint for improved fit and comfort. Reveiwed finger ROM in splint.    OT Occupational Profile and History Problem Focused Assessment - Including review of records relating  to presenting problem    Occupational performance deficits (Please refer to evaluation for details): ADL's;IADL's;Rest and Sleep;Work;Leisure;Social Participation    Body Structure / Function / Physical Skills ADL;UE functional use;Pain;Flexibility;FMC;ROM;GMC;Coordination;Decreased  knowledge of use of DME;IADL;Decreased knowledge of precautions;Skin integrity;Strength;Dexterity;Edema    Rehab Potential Good    Clinical Decision Making Limited treatment options, no task modification necessary    Comorbidities Affecting Occupational Performance: May have comorbidities impacting occupational performance    Modification or Assistance to Complete Evaluation  No modification of tasks or assist necessary to complete eval    OT Frequency 2x / week   will start at 1x week due to financial concerns however pt may require 2x week as she is able to perform ROM   OT Duration 12 weeks    OT Treatment/Interventions Self-care/ADL training;Ultrasound;Patient/family education;DME and/or AE instruction;Paraffin;Passive range of motion;Fluidtherapy;Cryotherapy;Electrical Stimulation;Contrast Bath;Splinting;Therapeutic activities;Manual Therapy;Therapeutic exercise;Moist Heat;Neuromuscular education    Plan no wrist ROM until cleared by MD, issue HEP when able           Patient will benefit from skilled therapeutic intervention in order to improve the following deficits and impairments:   Body Structure / Function / Physical Skills: ADL,UE functional use,Pain,Flexibility,FMC,ROM,GMC,Coordination,Decreased knowledge of use of DME,IADL,Decreased knowledge of precautions,Skin integrity,Strength,Dexterity,Edema       Visit Diagnosis: Muscle weakness (generalized)  Localized edema  Pain in right finger(s)  Stiffness of right hand, not elsewhere classified  Pain in right wrist  Stiffness of right wrist, not elsewhere classified    Problem List Patient Active Problem List   Diagnosis Date Noted  . Closed patellar sleeve fracture, left, initial encounter 11/10/2019  . Closed patellar sleeve fracture of right knee 10/28/2019  . Finger fracture, right 10/28/2019  . Conjunctivitis 11/07/2017  . Hyperlipidemia 01/01/2013  . Allergic rhinitis 12/12/2011  . Breast mass, right  11/23/2010  . WEIGHT GAIN 11/07/2009  . Depression, major, single episode, in partial remission ([facility]) 09/26/2006  . Essential hypertension 09/26/2006    Baker Moronta 05/26/2020, 11:02 AM  [facility] [address] [place], [place], [address] Phone: [phone]   Fax:  [phone]  Name: April Castillo MRN: [id number] Date of Birth: [birth year]

## 2020-05-26 NOTE — Progress Notes (Signed)
Patient presents about 3 weeks out from open reduction internal fixation of distal radius fracture.  She feels a little bit swollen and a little bit limited in her finger range of motion but otherwise is doing okay.  She has been to therapy for a splint change.  On examination she has a well-healing incision she has reasonable flexion and extension of her fingers with some limitation due to pain stiffness and swelling.  She reports normal sensation in her fingers.  Most recent x-ray shows maintained near-anatomic alignment of the fracture with good positioning of the hardware.  We will have her start range of motion exercises sometime next week.  I encouraged her to move her fingers within her splint to prevent stiffness there.  We will see her again in 3 weeks.

## 2020-06-02 ENCOUNTER — Ambulatory Visit: Payer: Medicare Other | Admitting: Occupational Therapy

## 2020-06-02 ENCOUNTER — Other Ambulatory Visit: Payer: Self-pay

## 2020-06-02 DIAGNOSIS — M25641 Stiffness of right hand, not elsewhere classified: Secondary | ICD-10-CM

## 2020-06-02 DIAGNOSIS — M25531 Pain in right wrist: Secondary | ICD-10-CM

## 2020-06-02 DIAGNOSIS — R600 Localized edema: Secondary | ICD-10-CM

## 2020-06-02 DIAGNOSIS — M6281 Muscle weakness (generalized): Secondary | ICD-10-CM | POA: Diagnosis not present

## 2020-06-02 DIAGNOSIS — M79644 Pain in right finger(s): Secondary | ICD-10-CM

## 2020-06-02 DIAGNOSIS — M25631 Stiffness of right wrist, not elsewhere classified: Secondary | ICD-10-CM

## 2020-06-02 NOTE — Patient Instructions (Signed)
MP Flexion (Active Isolated)   Bend __each____ finger at large knuckle, keeping other fingers straight. Do not bend tips. Repeat _10-15___ times. Do __4-6__ sessions per day.  AROM: PIP Flexion / Extension   Pinch bottom knuckle of __index______ finger of hand to prevent bending. Actively bend middle knuckle until stretch is felt. Hold __5__ seconds. Relax. Straighten finger as far as possible. Repeat __10-15__ times per set. Do _3__ sessions per day.   AROM: DIP Flexion / Extension   Pinch middle knuckle of ___index_____ finger of  hand to prevent bending. Bend end knuckle until stretch is felt. Hold _5___ seconds. Relax. Straighten finger as far as possible. Repeat _10-15___ times per set.  Do _3-4__ sessions per day.  AROM: Finger Flexion / Extension   Actively bend fingers of  hand. Start with knuckles furthest from palm, and slowly make a fist. Hold __5__ seconds. Relax. Then straighten fingers as far as possible. Repeat _10-15___ times per set.  Do _4-6___ sessions per day.  Copyright  VHI. All rights reserved.   AROM: Wrist Extension   .  With __right__ palm down, bend wrist up. Repeat __15__ times per set.  Do __4-6__ sessions per day.        AROM: Forearm Pronation / Supination   With __right__ arm in handshake position, slowly rotate palm down until stretch is felt. Relax. Then rotate palm up until stretch is felt. Repeat _15___ times per set. Do _4-6___ sessions per day.  Copyright  VHI. All rights reserved.      Opposition (Active)   Touch tip of thumb to nail tip of each finger in turn, making an "O" shape. Repeat __10__ times. Do _4-6___ sessions per day.   MP Flexion (Active)   Bend thumb to touch base of little finger, keeping tip joint straight. Repeat __10-15__ times. Do _4-6___ sessions per day.       IP Flexion (Active Blocked)   Brace thumb below tip joint. Bend joint as far as possible. Repeat __10__ times. Do _4-6___  sessions per day.   Composite Extension (Active)   Bring thumb up and out in hitchhiker position.  Repeat __10-15__ times. Do _4-6___ sessions per day.

## 2020-06-02 NOTE — Therapy (Signed)
[facility] [address] [place], [place], [address] Phone: [phone]   Fax:  [phone]  Occupational Therapy Treatment  Patient Details  Name: April Castillo MRN: [id number] Date of Birth: 1955-06-02 Referring Provider (OT): Dr. Claudia Desanctis (Dr. Marlou Sa for index finger)   Encounter Date: 06/02/2020   OT End of Session - 06/02/20 1519    Visit Number 3    Number of Visits 24    Date for OT Re-Evaluation 08/10/20    Authorization Type BCBS Medicare    Authorization - Visit Number 3    Authorization - Number of Visits 10    OT Start Time 7106    OT Stop Time 1528    OT Time Calculation (min) 40 min           Past Medical History:  Diagnosis Date  . Depression   . Heart murmur   . Hip fracture, right ([place]) 11/23/2010  . History of kidney stones   . Hypertension   . KNEE PAIN, LEFT 11/07/2009  . Mitral valve prolapse    reported by patient, does not see cardiologist  . Other and unspecified hyperlipidemia 01/01/2013  . PERIMENOPAUSAL SYNDROME 11/07/2009  . Rosacea 06/20/2006  . Tension headache 06/20/2006       . UTERINE FIBROID 06/20/2006    Past Surgical History:  Procedure Laterality Date  . CATARACT EXTRACTION W/ INTRAOCULAR LENS IMPLANT Left 08/03/13   Dr. Katy Fitch @ [facility]  . EYE SURGERY Right    Cataract  . ORIF PATELLA Right 10/29/2019   Procedure: OPEN REDUCTION INTERNAL (ORIF) FIXATION PATELLA;  Surgeon: Meredith Pel, MD;  Location: [facility];  Service: Orthopedics;  Laterality: Right;  . ORIF RADIAL FRACTURE Right 05/02/2020   Procedure: OPEN REDUCTION INTERNAL FIXATION (ORIF) RADIAL FRACTURE;  Surgeon: Cindra Presume, MD;  Location: [facility];  Service: Plastics;  Laterality: Right;  90 min, please  . r hip fracture and repair  January 24, 2010    There were no vitals filed for this visit.               Fluidotherapy x 10 mins to RUE, no adverse reactions. Retrograde  massage to digits and hand for moderate edema           OT Education - 06/02/20 1512    Education Details updated HEP- see pt instructions, pt is cleared for gentle A/ROM for wrist ,  gentle A/ROM and P/ROM to digits- see handout Pt was issued edema glove and instructed in wear, care and precautions- she verbalized understanding   Person(s) Educated Patient    Methods Explanation;Demonstration;Verbal cues;Handout    Comprehension Verbalized understanding;Returned demonstration;Verbal cues required            OT Short Term Goals - 05/18/20 1247      OT SHORT TERM GOAL #1   Title I with splint wear care and precautions.    Time 4    Period Weeks    Status New      OT SHORT TERM GOAL #2   Title I with inital HEP.    Time 4    Period Weeks    Status New      OT SHORT TERM GOAL #3   Title I with edema control techhniques    Time 4    Period Weeks      OT SHORT TERM GOAL #4   Title Pt will increase RUE index finger MP flexion  to 60  for increased functional use    Time 4    Period Weeks    Status New      OT SHORT TERM GOAL #5   Title Pt will increase RUE index finger PIP flexion to 75* and extension to 10 for increased functional use.    Time 4    Period Weeks    Status New             OT Long Term Goals - 05/18/20 1247      OT LONG TERM GOAL #1   Title I with updated HEP- 08/10/20    Time 12    Period Weeks    Status New    Target Date 08/10/20      OT LONG TERM GOAL #2   Title Pt will demonstrate at least 90% composite finger flexion for RUE.    Time 12    Period Weeks    Status New      OT LONG TERM GOAL #3   Title Pt will demonstrate grip strength of at least 25 lbs in RUE for increased functional use.    Time 12    Period Weeks    Status New      OT LONG TERM GOAL #4   Title Pt will resume use of RUE as dominant hand at least 90% of the time for ADLs/ IADLS with pain less than or equal to 3/10    Time 12    Period Weeks    Status New       OT LONG TERM GOAL #5   Title Pt will demonstrate right wrist flexion / extension WFLS for ADLs.    Time 12    Period Weeks    Status New      Long Term Additional Goals   Additional Long Term Goals Yes      OT LONG TERM GOAL #6   Title Pt will demonstrate forearm supination/ pronation for at least 85* for increased RUE functional use.    Time 12    Period Weeks    Status New                  Patient will benefit from skilled therapeutic intervention in order to improve the following deficits and impairments:           Visit Diagnosis: Muscle weakness (generalized)  Localized edema  Pain in right finger(s)  Stiffness of right hand, not elsewhere classified  Pain in right wrist  Stiffness of right wrist, not elsewhere classified    Problem List Patient Active Problem List   Diagnosis Date Noted  . Closed patellar sleeve fracture, left, initial encounter 11/10/2019  . Closed patellar sleeve fracture of right knee 10/28/2019  . Finger fracture, right 10/28/2019  . Conjunctivitis 11/07/2017  . Hyperlipidemia 01/01/2013  . Allergic rhinitis 12/12/2011  . Breast mass, right 11/23/2010  . WEIGHT GAIN 11/07/2009  . Depression, major, single episode, in partial remission ([place]) 09/26/2006  . Essential hypertension 09/26/2006    Ji Feldner 06/02/2020, 3:21 PM  [facility] [address] [place], [place], [address] Phone: [phone]   Fax:  [phone]  Name: April Castillo MRN: [id number] Date of Birth: 1955/10/12

## 2020-06-07 ENCOUNTER — Other Ambulatory Visit: Payer: Self-pay

## 2020-06-07 ENCOUNTER — Ambulatory Visit: Payer: Medicare Other | Admitting: Occupational Therapy

## 2020-06-07 DIAGNOSIS — R600 Localized edema: Secondary | ICD-10-CM

## 2020-06-07 DIAGNOSIS — M25641 Stiffness of right hand, not elsewhere classified: Secondary | ICD-10-CM

## 2020-06-07 DIAGNOSIS — M25531 Pain in right wrist: Secondary | ICD-10-CM

## 2020-06-07 DIAGNOSIS — M79644 Pain in right finger(s): Secondary | ICD-10-CM

## 2020-06-07 DIAGNOSIS — M25631 Stiffness of right wrist, not elsewhere classified: Secondary | ICD-10-CM

## 2020-06-07 DIAGNOSIS — M6281 Muscle weakness (generalized): Secondary | ICD-10-CM | POA: Diagnosis not present

## 2020-06-07 NOTE — Therapy (Signed)
[facility] [address] [place], [place], [address] Phone: [phone]   Fax:  [phone]  Occupational Therapy Treatment  Patient Details  Name: April Castillo MRN: [id number] Date of Birth: 1955-06-22 Referring Provider (OT): Dr. Claudia Desanctis (Dr. Marlou Sa for index finger)   Encounter Date: 06/07/2020   OT End of Session - 06/07/20 1455    Visit Number 4    Number of Visits 24    Date for OT Re-Evaluation 08/10/20    Authorization Type BCBS Medicare    Authorization - Visit Number 4    Authorization - Number of Visits 10    OT Start Time 1400    OT Stop Time 1445    OT Time Calculation (min) 45 min    Activity Tolerance Patient tolerated treatment well    Behavior During Therapy [facility] for tasks assessed/performed           Past Medical History:  Diagnosis Date  . Depression   . Heart murmur   . Hip fracture, right ([facility]) 11/23/2010  . History of kidney stones   . Hypertension   . KNEE PAIN, LEFT 11/07/2009  . Mitral valve prolapse    reported by patient, does not see cardiologist  . Other and unspecified hyperlipidemia 01/01/2013  . PERIMENOPAUSAL SYNDROME 11/07/2009  . Rosacea 06/20/2006  . Tension headache 06/20/2006       . UTERINE FIBROID 06/20/2006    Past Surgical History:  Procedure Laterality Date  . CATARACT EXTRACTION W/ INTRAOCULAR LENS IMPLANT Left 08/03/13   Dr. Katy Fitch @ [facility]  . EYE SURGERY Right    Cataract  . ORIF PATELLA Right 10/29/2019   Procedure: OPEN REDUCTION INTERNAL (ORIF) FIXATION PATELLA;  Surgeon: Meredith Pel, MD;  Location: [facility];  Service: Orthopedics;  Laterality: Right;  . ORIF RADIAL FRACTURE Right 05/02/2020   Procedure: OPEN REDUCTION INTERNAL FIXATION (ORIF) RADIAL FRACTURE;  Surgeon: Cindra Presume, MD;  Location: [facility];  Service: Plastics;  Laterality: Right;  90 min, please  . r hip fracture and repair  January 24, 2010    There were no vitals  filed for this visit.   Subjective Assessment - 06/07/20 1429    Subjective  Pt extremely hypersensitive along incision    Pertinent History Pt is a 65 y.o female with a diagnosis of comminuted intra-articular right distal radius fx, s/p ORIF by Dr. Claudia Desanctis on 05/02/20. Pt also has a dignosis of closed nondisplaced fracture of middle phalanx of right index finger 10/27/19 for which she has not yet recieved OT.    Limitations 2/10/22cleared for gentle A/ROM to wrist,    Currently in Pain? Yes    Pain Score 1    however hypersensitivity high w/ [place] and scar massage   Pain Location Wrist    Pain Orientation Right    Pain Descriptors / Indicators Aching    Pain Type Acute pain    Pain Onset 1 to 4 weeks ago    Pain Frequency Intermittent    Aggravating Factors  movement    Pain Relieving Factors rest, meds            Ultrasound x 8 min to volar forearm along incision and into palm of hand for scar tissue management and edema, followed by 2 min on dorsal hand d/t edema = 10 min total, at 3 Mhz, 20% pulsed, 0.8 wts/cm2. Pt extremely hypersensitive along scar and was trying to pull arm away. Pt  was shown scar massage and desensitization techniques and issued handouts.   Reviewed all HEP's - pt noted to be very stiff at all PIP joints, not just index finger (ring finger was most stiff). Wrist flex/ext x 15 reps (1/2 composite wrist/finger ext and 1/2 with fingers flexed during wrist ext), sup/pron, isolated finger flexion ex's and composite finger flex/ext, as well as thumb ex's                    OT Education - 06/07/20 1436    Education Details desensitization techniques, scar massage, edema management    Person(s) Educated Patient    Methods Explanation;Demonstration;Verbal cues;Handout    Comprehension Verbalized understanding            OT Short Term Goals - 06/07/20 1456      OT SHORT TERM GOAL #1   Title I with splint wear care and precautions.    Time 4    Period  Weeks    Status Achieved      OT SHORT TERM GOAL #2   Title I with inital HEP.    Time 4    Period Weeks    Status Achieved      OT SHORT TERM GOAL #3   Title I with edema control techhniques    Time 4    Period Weeks    Status Achieved      OT SHORT TERM GOAL #4   Title Pt will increase RUE index finger MP flexion to 75  for increased functional use    Time 4    Period Weeks    Status On-going      OT SHORT TERM GOAL #5   Title Pt will increase RUE index finger PIP flexion to 75* and extension to 10 for increased functional use.    Time 4    Period Weeks    Status On-going             OT Long Term Goals - 05/18/20 1247      OT LONG TERM GOAL #1   Title I with updated HEP- 08/10/20    Time 12    Period Weeks    Status New    Target Date 08/10/20      OT LONG TERM GOAL #2   Title Pt will demonstrate at least 90% composite finger flexion for RUE.    Time 12    Period Weeks    Status New      OT LONG TERM GOAL #3   Title Pt will demonstrate grip strength of at least 25 lbs in RUE for increased functional use.    Time 12    Period Weeks    Status New      OT LONG TERM GOAL #4   Title Pt will resume use of RUE as dominant hand at least 90% of the time for ADLs/ IADLS with pain less than or equal to 3/10    Time 12    Period Weeks    Status New      OT LONG TERM GOAL #5   Title Pt will demonstrate right wrist flexion / extension WFLS for ADLs.    Time 12    Period Weeks    Status New      Long Term Additional Goals   Additional Long Term Goals Yes      OT LONG TERM GOAL #6   Title Pt will demonstrate forearm supination/ pronation for at least 85*  for increased RUE functional use.    Time 12    Period Weeks    Status New                 Plan - 06/07/20 1456    Clinical Impression Statement Pt very stiff in fingers digits 2-4 at PIP joints (not just index fnger). Pt also very stiff with wrist ROM. Pt with edema and extremely hypersensitive  over incision area with ultrasound and scar massage today    OT Occupational Profile and History Problem Focused Assessment - Including review of records relating to presenting problem    Occupational performance deficits (Please refer to evaluation for details): ADL's;IADL's;Rest and Sleep;Work;Leisure;Social Participation    Body Structure / Function / Physical Skills ADL;UE functional use;Pain;Flexibility;FMC;ROM;GMC;Coordination;Decreased knowledge of use of DME;IADL;Decreased knowledge of precautions;Skin integrity;Strength;Dexterity;Edema    Rehab Potential Good    Clinical Decision Making Limited treatment options, no task modification necessary    Comorbidities Affecting Occupational Performance: May have comorbidities impacting occupational performance    Modification or Assistance to Complete Evaluation  No modification of tasks or assist necessary to complete eval    OT Frequency 2x / week   will start at 1x week due to financial concerns however pt may require 2x week as she is able to perform ROM   OT Duration 12 weeks    OT Treatment/Interventions Self-care/ADL training;Ultrasound;Patient/family education;DME and/or AE instruction;Paraffin;Passive range of motion;Fluidtherapy;Cryotherapy;Electrical Stimulation;Contrast Bath;Splinting;Therapeutic activities;Manual Therapy;Therapeutic exercise;Moist Heat;Neuromuscular education    Plan continue [place] over incision, review scar massage, consider increasing to 2x/wk as pt is only coming 1x/wk, send note to PA via EPIC (she has appt with O.T. prior to PA next week same day) re: progressing to P/ROM to wrist and when to begin light hand strengthening    Consulted and Agree with Plan of Care Patient           Patient will benefit from skilled therapeutic intervention in order to improve the following deficits and impairments:   Body Structure / Function / Physical Skills: ADL,UE functional  use,Pain,Flexibility,FMC,ROM,GMC,Coordination,Decreased knowledge of use of DME,IADL,Decreased knowledge of precautions,Skin integrity,Strength,Dexterity,Edema       Visit Diagnosis: Stiffness of right wrist, not elsewhere classified  Pain in right wrist  Stiffness of right hand, not elsewhere classified  Pain in right finger(s)  Localized edema    Problem List Patient Active Problem List   Diagnosis Date Noted  . Closed patellar sleeve fracture, left, initial encounter 11/10/2019  . Closed patellar sleeve fracture of right knee 10/28/2019  . Finger fracture, right 10/28/2019  . Conjunctivitis 11/07/2017  . Hyperlipidemia 01/01/2013  . Allergic rhinitis 12/12/2011  . Breast mass, right 11/23/2010  . WEIGHT GAIN 11/07/2009  . Depression, major, single episode, in partial remission ([place]) 09/26/2006  . Essential hypertension 09/26/2006    Carey Bullocks, OTR/L 06/07/2020, 3:03 PM  [facility] [address] [place], [place], [address] Phone: [phone]   Fax:  [phone]  Name: CONCHETTA LAMIA MRN: [id number] Date of Birth: 11-02-1955

## 2020-06-07 NOTE — Patient Instructions (Signed)
Desensitization Techniques  Perform these exercises ever 2 hours for 15 minute sessions.  Progress to the next exercises when the exercises you are doing become easy.  1)  Using light pressure, rub the various textures along with the hypersensitive area:  A.  Douglas  C.  Wool  G.  Cotton material  D.  Terry cloth  2)  With the same textures use a firmer pressure. 3)  Use a hand held vibrator and massage along the sensitive area. 4)  With a small dowel rod, eraser on a pencil or base of an ink pen tap along the sensitive area. 5)  Use an empty roll-on deodorant bottle to roll along the sensitive area. 6)  Place your hand/forearm in separate containers of the following items:  A.  Sand  D.  Dry lentil beans  B.  Dry Rice  E.  Dry kidney beans  C.  Ball bearings F.  Dry pinto beans  Scar Massage Purpose: To soften/smooth scar tissue.   To desensitize sensitive areas after surgery.   To mechanically break up inner scar tissue, adhesions, therefore allowing freer        movement of injured tendons and muscle.  Technique: Use a cream to massage with, as it insures a smooth gliding motion and avoids irritation caused by rubbing skin to skin.  Cream is preferred over a lotion.   May begin as soon as any suture areas are healed.   Apply a firm, steady pressure with your finger-tip, pulling the skin in a circular motion over the scarred area.  Do not rub.  Message 5-10 minutes, at least 2 times a day, unless you are getting tender afterwards

## 2020-06-16 ENCOUNTER — Encounter: Payer: Self-pay | Admitting: Surgical

## 2020-06-16 ENCOUNTER — Telehealth: Payer: Self-pay

## 2020-06-16 ENCOUNTER — Ambulatory Visit (INDEPENDENT_AMBULATORY_CARE_PROVIDER_SITE_OTHER): Payer: Medicare Other | Admitting: Surgical

## 2020-06-16 ENCOUNTER — Ambulatory Visit: Payer: Medicare Other | Admitting: Occupational Therapy

## 2020-06-16 ENCOUNTER — Other Ambulatory Visit: Payer: Self-pay

## 2020-06-16 VITALS — BP 138/71 | HR 76 | Temp 97.3°F

## 2020-06-16 DIAGNOSIS — R600 Localized edema: Secondary | ICD-10-CM

## 2020-06-16 DIAGNOSIS — M25531 Pain in right wrist: Secondary | ICD-10-CM

## 2020-06-16 DIAGNOSIS — M25631 Stiffness of right wrist, not elsewhere classified: Secondary | ICD-10-CM

## 2020-06-16 DIAGNOSIS — S62101A Fracture of unspecified carpal bone, right wrist, initial encounter for closed fracture: Secondary | ICD-10-CM

## 2020-06-16 DIAGNOSIS — M6281 Muscle weakness (generalized): Secondary | ICD-10-CM

## 2020-06-16 DIAGNOSIS — M25641 Stiffness of right hand, not elsewhere classified: Secondary | ICD-10-CM

## 2020-06-16 NOTE — Telephone Encounter (Signed)
Hello Dr Pace/Matthew Aris Everts,   April Castillo sees you later today at 3:00. She is over 6 weeks post-op at this time. She reports she has been going without her wrist splint for about 5 days now. I did not clear her for this, but hoping she can at this point due to significant stiffness. Please advise her if she can d/c splint totally or if you would like her to wear only when out, or continue to wear.  She has been doing A/ROM to wrist and active and passive ROM to fingers. May we begin P/ROM to wrist and putty ex's for hand strengthening at this time (which may also improve finger ROM b/c fingers are very stiff)?  Do you think we can begin wrist strengthening at 8 weeks post-op?  Thank you and let us know if there is anything else we can do.  Redmond Baseman, OTR/L

## 2020-06-16 NOTE — Telephone Encounter (Signed)
DC splint should be fine.  Ok to go ahead w strengthening.  Thanks.

## 2020-06-16 NOTE — Progress Notes (Signed)
Patient is a 65 year old female here for follow-up after open reduction internal fixation of her distal radius fracture with Dr. Claudia Desanctis.  She is just shy of 7 weeks postop.  She has been going to therapy for improvement with range of motion and strengthening.  She reports that overall she is doing well, she reports that she feels as if her motion in her wrist is improving, but she feels limited in the range of motion of her fingers.  She reports she has been working with therapy on improving the range of motion of both the wrist and her fingers.  She does not report any pain.  She feels as if the range of motion of her fingers has been improving, but she is hopeful for more improvement.  She reports that she has not been wearing the splint for about a week now and notices less swelling at this time.  On exam wrist incision is intact.  2+ radial pulses noted.  Distal extremity with good capillary refill and fairly normal sensation.  She has good color of the distal extremity.  She has about 45 degree flexion of the wrist.  She has some decreased flexion of her fingers, some decreased extension as well.  Swelling continues to improve.  Recommend continuing with occupational therapy for strengthening and improvement in range of motion.  We discussed that she can continue to not wear the splint.  She can drive if she feels safe driving.  She should not drive if she is taking any pain medications or feels unsafe to drive.  Patient is understanding and in agreement with this. We discussed scheduling an additional follow-up or following up as needed, patient reports that she would like to follow-up on an as-needed basis.

## 2020-06-16 NOTE — Therapy (Signed)
[facility] [address] [place], [place], [address] Phone: [phone]   Fax:  [phone]  Occupational Therapy Treatment  Patient Details  Name: April Castillo MRN: [id number] Date of Birth: May 21, 1955 Referring Provider (OT): Dr. Claudia Desanctis (Dr. Marlou Sa for index finger)   Encounter Date: 06/16/2020   OT End of Session - 06/16/20 1212    Visit Number 5    Number of Visits 24    Date for OT Re-Evaluation 08/10/20    Authorization Type BCBS Medicare    Authorization - Visit Number 5    Authorization - Number of Visits 10    OT Start Time 1100    OT Stop Time 1145    OT Time Calculation (min) 45 min    Activity Tolerance Patient tolerated treatment well    Behavior During Therapy [facility] for tasks assessed/performed           Past Medical History:  Diagnosis Date  . Depression   . Heart murmur   . Hip fracture, right ([place]) 11/23/2010  . History of kidney stones   . Hypertension   . KNEE PAIN, LEFT 11/07/2009  . Mitral valve prolapse    reported by patient, does not see cardiologist  . Other and unspecified hyperlipidemia 01/01/2013  . PERIMENOPAUSAL SYNDROME 11/07/2009  . Rosacea 06/20/2006  . Tension headache 06/20/2006       . UTERINE FIBROID 06/20/2006    Past Surgical History:  Procedure Laterality Date  . CATARACT EXTRACTION W/ INTRAOCULAR LENS IMPLANT Left 08/03/13   Dr. Katy Fitch @ [facility] [place]  . EYE SURGERY Right    Cataract  . ORIF PATELLA Right 10/29/2019   Procedure: OPEN REDUCTION INTERNAL (ORIF) FIXATION PATELLA;  Surgeon: Meredith Pel, MD;  Location: [facility];  Service: Orthopedics;  Laterality: Right;  . ORIF RADIAL FRACTURE Right 05/02/2020   Procedure: OPEN REDUCTION INTERNAL FIXATION (ORIF) RADIAL FRACTURE;  Surgeon: Cindra Presume, MD;  Location: [facility];  Service: Plastics;  Laterality: Right;  90 min, please  . r hip fracture and repair  January 24, 2010    There were no vitals  filed for this visit.   Subjective Assessment - 06/16/20 1108    Subjective  I haven't worn my wrist splint for about 5 days now since I was so stiff    Pertinent History Pt is a 65 y.o female with a diagnosis of comminuted intra-articular right distal radius fx, s/p ORIF by Dr. Claudia Desanctis on 05/02/20. Pt also has a dignosis of closed nondisplaced fracture of middle phalanx of right index finger 10/27/19 for which she has not yet recieved OT.    Limitations 06/02/20 cleared for gentle A/ROM to wrist    Currently in Pain? No/denies    Pain Onset 1 to 4 weeks ago           Ultrasound over incision area volar wrist and into palm/fingers to manage scar tissue and edema (previous parameters) - pt with less hypersensitivity today but still did not tolerate over incision well.  Continued A/ROM at wrist and fingers w/ focus on isolated intrinsic + and - movements, as well as composite flexion, and passive flexion and extension of fingers.  Wrist flex = 45*, ext = 30* Paraffin x 10 min to decrease finger stiffness. While pt cooling off from paraffin, therapist wrote note in Epic to MD/PA as pt sees them today and is now over 6 weeks post-op re: ? progression of P/ROM to  wrist, hand strengthening, weaning and/or d/c from splint.  Pt issued tan foam for finger rolling ex to improve movement. Pt encouraged to keep doing retrograde massage, finger abd/add, MP flex/ext, and wearing compression glove at night to help w/ edema control                        OT Short Term Goals - 06/07/20 1456      OT SHORT TERM GOAL #1   Title I with splint wear care and precautions.    Time 4    Period Weeks    Status Achieved      OT SHORT TERM GOAL #2   Title I with inital HEP.    Time 4    Period Weeks    Status Achieved      OT SHORT TERM GOAL #3   Title I with edema control techhniques    Time 4    Period Weeks    Status Achieved      OT SHORT TERM GOAL #4   Title Pt will increase RUE index  finger MP flexion to 75  for increased functional use    Time 4    Period Weeks    Status On-going      OT SHORT TERM GOAL #5   Title Pt will increase RUE index finger PIP flexion to 75* and extension to 10 for increased functional use.    Time 4    Period Weeks    Status On-going             OT Long Term Goals - 05/18/20 1247      OT LONG TERM GOAL #1   Title I with updated HEP- 08/10/20    Time 12    Period Weeks    Status New    Target Date 08/10/20      OT LONG TERM GOAL #2   Title Pt will demonstrate at least 90% composite finger flexion for RUE.    Time 12    Period Weeks    Status New      OT LONG TERM GOAL #3   Title Pt will demonstrate grip strength of at least 25 lbs in RUE for increased functional use.    Time 12    Period Weeks    Status New      OT LONG TERM GOAL #4   Title Pt will resume use of RUE as dominant hand at least 90% of the time for ADLs/ IADLS with pain less than or equal to 3/10    Time 12    Period Weeks    Status New      OT LONG TERM GOAL #5   Title Pt will demonstrate right wrist flexion / extension WFLS for ADLs.    Time 12    Period Weeks    Status New      Long Term Additional Goals   Additional Long Term Goals Yes      OT LONG TERM GOAL #6   Title Pt will demonstrate forearm supination/ pronation for at least 85* for increased RUE functional use.    Time 12    Period Weeks    Status New                 Plan - 06/16/20 1213    Clinical Impression Statement Pt with improved hypersensitivity over scar. Pt also w/ decreased edema however fingers remain very stiff.  OT Occupational Profile and History Problem Focused Assessment - Including review of records relating to presenting problem    Occupational performance deficits (Please refer to evaluation for details): ADL's;IADL's;Rest and Sleep;Work;Leisure;Social Participation    Body Structure / Function / Physical Skills ADL;UE functional  use;Pain;Flexibility;FMC;ROM;GMC;Coordination;Decreased knowledge of use of DME;IADL;Decreased knowledge of precautions;Skin integrity;Strength;Dexterity;Edema    Rehab Potential Good    Clinical Decision Making Limited treatment options, no task modification necessary    Comorbidities Affecting Occupational Performance: May have comorbidities impacting occupational performance    Modification or Assistance to Complete Evaluation  No modification of tasks or assist necessary to complete eval    OT Frequency 2x / week   will start at 1x week due to financial concerns however pt may require 2x week as she is able to perform ROM   OT Duration 12 weeks    OT Treatment/Interventions Self-care/ADL training;Ultrasound;Patient/family education;DME and/or AE instruction;Paraffin;Passive range of motion;Fluidtherapy;Cryotherapy;Electrical Stimulation;Contrast Bath;Splinting;Therapeutic activities;Manual Therapy;Therapeutic exercise;Moist Heat;Neuromuscular education    Plan increase frequency to 2x/wk, progress as able (note sent via EPIC to MD/PA re: progression of therapy)    Consulted and Agree with Plan of Care Patient           Patient will benefit from skilled therapeutic intervention in order to improve the following deficits and impairments:   Body Structure / Function / Physical Skills: ADL,UE functional use,Pain,Flexibility,FMC,ROM,GMC,Coordination,Decreased knowledge of use of DME,IADL,Decreased knowledge of precautions,Skin integrity,Strength,Dexterity,Edema       Visit Diagnosis: Stiffness of right wrist, not elsewhere classified  Pain in right wrist  Stiffness of right hand, not elsewhere classified  Localized edema  Muscle weakness (generalized)    Problem List Patient Active Problem List   Diagnosis Date Noted  . Closed patellar sleeve fracture, left, initial encounter 11/10/2019  . Closed patellar sleeve fracture of right knee 10/28/2019  . Finger fracture, right  10/28/2019  . Conjunctivitis 11/07/2017  . Hyperlipidemia 01/01/2013  . Allergic rhinitis 12/12/2011  . Breast mass, right 11/23/2010  . WEIGHT GAIN 11/07/2009  . Depression, major, single episode, in partial remission ([place]) 09/26/2006  . Essential hypertension 09/26/2006    Carey Bullocks, OTR/L 06/16/2020, 12:15 PM  [facility] [address] [place], [place], [address] Phone: [phone]   Fax:  [phone]  Name: OCEANE FOSSE MRN: [id number] Date of Birth: Sep 17, 1955

## 2020-06-21 ENCOUNTER — Ambulatory Visit: Payer: Medicare Other | Attending: Internal Medicine | Admitting: Occupational Therapy

## 2020-06-21 ENCOUNTER — Other Ambulatory Visit: Payer: Self-pay

## 2020-06-21 DIAGNOSIS — M25641 Stiffness of right hand, not elsewhere classified: Secondary | ICD-10-CM | POA: Diagnosis present

## 2020-06-21 DIAGNOSIS — M79644 Pain in right finger(s): Secondary | ICD-10-CM | POA: Insufficient documentation

## 2020-06-21 DIAGNOSIS — R600 Localized edema: Secondary | ICD-10-CM | POA: Insufficient documentation

## 2020-06-21 DIAGNOSIS — M25631 Stiffness of right wrist, not elsewhere classified: Secondary | ICD-10-CM | POA: Diagnosis present

## 2020-06-21 DIAGNOSIS — M6281 Muscle weakness (generalized): Secondary | ICD-10-CM | POA: Diagnosis present

## 2020-06-21 DIAGNOSIS — M25531 Pain in right wrist: Secondary | ICD-10-CM | POA: Insufficient documentation

## 2020-06-21 NOTE — Therapy (Signed)
[facility] [address] [place], [place], [address] Phone: [phone]   Fax:  [phone]  Occupational Therapy Treatment  Patient Details  Name: April Castillo MRN: [id number] Date of Birth: Mar 29, 1956 Referring Provider (OT): Dr. Claudia Desanctis (Dr. Marlou Sa for index finger)   Encounter Date: 06/21/2020   OT End of Session - 06/21/20 0920    Visit Number 6    Number of Visits 24    Date for OT Re-Evaluation 08/10/20    Authorization Type BCBS Medicare    Authorization - Visit Number 6    Authorization - Number of Visits 10    OT Start Time 0845    OT Stop Time 0925    OT Time Calculation (min) 40 min    Activity Tolerance Patient tolerated treatment well    Behavior During Therapy [facility] for tasks assessed/performed           Past Medical History:  Diagnosis Date  . Depression   . Heart murmur   . Hip fracture, right ([place]) 11/23/2010  . History of kidney stones   . Hypertension   . KNEE PAIN, LEFT 11/07/2009  . Mitral valve prolapse    reported by patient, does not see cardiologist  . Other and unspecified hyperlipidemia 01/01/2013  . PERIMENOPAUSAL SYNDROME 11/07/2009  . Rosacea 06/20/2006  . Tension headache 06/20/2006       . UTERINE FIBROID 06/20/2006    Past Surgical History:  Procedure Laterality Date  . CATARACT EXTRACTION W/ INTRAOCULAR LENS IMPLANT Left 08/03/13   Dr. Katy Fitch @ [facility] [place]  . EYE SURGERY Right    Cataract  . ORIF PATELLA Right 10/29/2019   Procedure: OPEN REDUCTION INTERNAL (ORIF) FIXATION PATELLA;  Surgeon: Meredith Pel, MD;  Location: [facility];  Service: Orthopedics;  Laterality: Right;  . ORIF RADIAL FRACTURE Right 05/02/2020   Procedure: OPEN REDUCTION INTERNAL FIXATION (ORIF) RADIAL FRACTURE;  Surgeon: Cindra Presume, MD;  Location: [facility];  Service: Plastics;  Laterality: Right;  90 min, please  . r hip fracture and repair  January 24, 2010    There were no vitals filed  for this visit.   Subjective Assessment - 06/21/20 0850    Subjective  Pt returns after seeing MD last week for clearance to d/c splint and begin strengthening    Pertinent History Pt is a 65 y.o female with a diagnosis of comminuted intra-articular right distal radius fx, s/p ORIF by Dr. Claudia Desanctis on 05/02/20. Pt also has a dignosis of closed nondisplaced fracture of middle phalanx of right index finger 10/27/19 for which she has not yet recieved OT.    Limitations 06/02/20 cleared for gentle A/ROM to wrist, 06/16/20 cleared for d/c splint and strengthening    Currently in Pain? No/denies    Pain Onset 1 to 4 weeks ago              [facility] OT Assessment - 06/21/20 0001      Precautions   Precaution Comments cleared for strengthening and d/c splint after seeing MD on 06/16/20                    OT Treatments/Exercises (OP) - 06/21/20 0001      Exercises   Exercises Wrist;Hand      Wrist Exercises   Other wrist exercises Active wrist flex/ext and forearm pronation/supination performed, followed by initiation of P/ROM w/ wrist flex/ext and sup/pron - see pt instructions for details.  Pt did each as indicated    Other wrist exercises wrist winder (no weight) winding up and down x 2      Hand Exercises   Other Hand Exercises Pt issued putty HEP and yellow resistance putty - see pt instructions for details. Pt did each as indicated.      Modalities   Modalities Fluidotherapy      RUE Fluidotherapy   Number Minutes Fluidotherapy 10 Minutes    RUE Fluidotherapy Location Hand;Wrist    Comments to decrease stiffness                  OT Education - 06/21/20 0855    Education Details P/ROM HEP for wrist and forearm, putty HEP for hand strengthening    Person(s) Educated Patient    Methods Explanation;Demonstration;Verbal cues;Handout    Comprehension Verbalized understanding;Returned demonstration;Verbal cues required            OT Short Term Goals - 06/21/20 0921       OT SHORT TERM GOAL #1   Title I with splint wear care and precautions.    Time 4    Period Weeks    Status Achieved      OT SHORT TERM GOAL #2   Title I with inital HEP.    Time 4    Period Weeks    Status Achieved      OT SHORT TERM GOAL #3   Title I with edema control techhniques    Time 4    Period Weeks    Status Achieved      OT SHORT TERM GOAL #4   Title Pt will increase RUE index finger MP flexion to 75  for increased functional use    Time 4    Period Weeks    Status On-going   06/21/20: MP flex = 70*     OT SHORT TERM GOAL #5   Title Pt will increase RUE index finger PIP flexion to 75* and extension to 10 for increased functional use.    Time 4    Period Weeks    Status On-going   06/21/20: PIP flex = 65*            OT Long Term Goals - 06/21/20 0923      OT LONG TERM GOAL #1   Title I with updated HEP- 08/10/20    Time 12    Period Weeks    Status On-going      OT LONG TERM GOAL #2   Title Pt will demonstrate at least 90% composite finger flexion for RUE.    Time 12    Period Weeks    Status On-going   approx 80%     OT LONG TERM GOAL #3   Title Pt will demonstrate grip strength of at least 25 lbs in RUE for increased functional use.    Time 12    Period Weeks    Status On-going      OT LONG TERM GOAL #4   Title Pt will resume use of RUE as dominant hand at least 90% of the time for ADLs/ IADLS with pain less than or equal to 3/10    Time 12    Period Weeks    Status On-going      OT LONG TERM GOAL #5   Title Pt will demonstrate right wrist flexion / extension WFLS for ADLs.    Time 12    Period Weeks    Status On-going  OT LONG TERM GOAL #6   Title Pt will demonstrate forearm supination/ pronation for at least 85* for increased RUE functional use.    Time 12    Period Weeks    Status New                 Plan - 06/21/20 9417    Clinical Impression Statement Pt now cleared for strengthening and d/c splint. Began P/ROM to wrist  and hand strengthening today. Pt progressing towards goals but fingers remain stiff.    OT Occupational Profile and History Problem Focused Assessment - Including review of records relating to presenting problem    Occupational performance deficits (Please refer to evaluation for details): ADL's;IADL's;Rest and Sleep;Work;Leisure;Social Participation    Body Structure / Function / Physical Skills ADL;UE functional use;Pain;Flexibility;FMC;ROM;GMC;Coordination;Decreased knowledge of use of DME;IADL;Decreased knowledge of precautions;Skin integrity;Strength;Dexterity;Edema    Rehab Potential Good    Clinical Decision Making Limited treatment options, no task modification necessary    Comorbidities Affecting Occupational Performance: May have comorbidities impacting occupational performance    Modification or Assistance to Complete Evaluation  No modification of tasks or assist necessary to complete eval    OT Frequency 2x / week   will start at 1x week due to financial concerns however pt may require 2x week as she is able to perform ROM   OT Duration 12 weeks    OT Treatment/Interventions Self-care/ADL training;Ultrasound;Patient/family education;DME and/or AE instruction;Paraffin;Passive range of motion;Fluidtherapy;Cryotherapy;Electrical Stimulation;Contrast Bath;Splinting;Therapeutic activities;Manual Therapy;Therapeutic exercise;Moist Heat;Neuromuscular education    Plan continue A/ROM and P/ROM to wrist/forearm, putty for hand strengthening, modalities, begin strengthening to wrist/forearm 06/28/20.    Consulted and Agree with Plan of Care Patient           Patient will benefit from skilled therapeutic intervention in order to improve the following deficits and impairments:   Body Structure / Function / Physical Skills: ADL,UE functional use,Pain,Flexibility,FMC,ROM,GMC,Coordination,Decreased knowledge of use of DME,IADL,Decreased knowledge of precautions,Skin  integrity,Strength,Dexterity,Edema       Visit Diagnosis: Stiffness of right wrist, not elsewhere classified  Pain in right wrist  Stiffness of right hand, not elsewhere classified  Localized edema  Muscle weakness (generalized)    Problem List Patient Active Problem List   Diagnosis Date Noted  . Closed patellar sleeve fracture, left, initial encounter 11/10/2019  . Closed patellar sleeve fracture of right knee 10/28/2019  . Finger fracture, right 10/28/2019  . Conjunctivitis 11/07/2017  . Hyperlipidemia 01/01/2013  . Allergic rhinitis 12/12/2011  . Breast mass, right 11/23/2010  . WEIGHT GAIN 11/07/2009  . Depression, major, single episode, in partial remission ([place]) 09/26/2006  . Essential hypertension 09/26/2006    Carey Bullocks, OTR/L 06/21/2020, 9:28 AM  [facility] [address] [place], [place], [address] Phone: [phone]   Fax:  [phone]  Name: CANDISE CRABTREE MRN: [id number] Date of Birth: 1955-08-17

## 2020-06-21 NOTE — Patient Instructions (Addendum)
   PROM: Wrist Flexion / Extension   Grasp  hand and slowly bend wrist until stretch is felt. Relax. Then stretch as far as possible in opposite direction. Be sure to keep elbow bent.  Hold __10__ sec. each way Repeat _5___ times per set each way.    Do _4-6___ sessions per day.  Pronation (Passive)   Keep elbow bent at right angle and held firmly to side. Use other hand to turn forearm until palm faces downward. Hold _10___ seconds. Repeat __5__ times. Do _4-6___ sessions per day.  Supination (Passive)   Keep elbow bent at right angle and held firmly at side. Use other hand to turn forearm until palm faces upward. Hold __10__ seconds. Repeat __5__ times. Do _4-6___ sessions per day.  1. Grip Strengthening (Resistive Putty)   Squeeze putty using thumb and all fingers. Repeat _20___ times. Do __2__ sessions per day.   2. Roll putty into tube on table and pinch between first two fingers and thumb x 10 reps. Do 2 sessions per day   IP Fisting (Resistive Putty)    Keeping knuckles straight, bend fingertips to squeeze putty. Repeat _10___ times. Do __2__ sessions per day.

## 2020-06-23 ENCOUNTER — Other Ambulatory Visit: Payer: Self-pay

## 2020-06-23 ENCOUNTER — Encounter: Payer: Self-pay | Admitting: Occupational Therapy

## 2020-06-23 ENCOUNTER — Ambulatory Visit: Payer: Medicare Other | Admitting: Occupational Therapy

## 2020-06-23 DIAGNOSIS — M25531 Pain in right wrist: Secondary | ICD-10-CM

## 2020-06-23 DIAGNOSIS — M6281 Muscle weakness (generalized): Secondary | ICD-10-CM

## 2020-06-23 DIAGNOSIS — M25641 Stiffness of right hand, not elsewhere classified: Secondary | ICD-10-CM

## 2020-06-23 DIAGNOSIS — M25631 Stiffness of right wrist, not elsewhere classified: Secondary | ICD-10-CM

## 2020-06-23 DIAGNOSIS — M79644 Pain in right finger(s): Secondary | ICD-10-CM

## 2020-06-23 DIAGNOSIS — R600 Localized edema: Secondary | ICD-10-CM

## 2020-06-23 NOTE — Therapy (Signed)
[facility] [address] [place], [place], [address] Phone: [phone]   Fax:  [phone]  Occupational Therapy Treatment  Patient Details  Name: April Castillo MRN: [id number] Date of Birth: 02/10/1956 Referring Provider (OT): Dr. Claudia Desanctis (Dr. Marlou Sa for index finger)   Encounter Date: 06/23/2020   OT End of Session - 06/23/20 1134    Visit Number 7    Number of Visits 24    Date for OT Re-Evaluation 08/10/20    Authorization Type BCBS Medicare    Authorization - Visit Number 7    Authorization - Number of Visits 10    OT Start Time 1104    OT Stop Time 1145    OT Time Calculation (min) 41 min    Activity Tolerance Patient tolerated treatment well    Behavior During Therapy [facility] for tasks assessed/performed           Past Medical History:  Diagnosis Date  . Depression   . Heart murmur   . Hip fracture, right ([place]) 11/23/2010  . History of kidney stones   . Hypertension   . KNEE PAIN, LEFT 11/07/2009  . Mitral valve prolapse    reported by patient, does not see cardiologist  . Other and unspecified hyperlipidemia 01/01/2013  . PERIMENOPAUSAL SYNDROME 11/07/2009  . Rosacea 06/20/2006  . Tension headache 06/20/2006       . UTERINE FIBROID 06/20/2006    Past Surgical History:  Procedure Laterality Date  . CATARACT EXTRACTION W/ INTRAOCULAR LENS IMPLANT Left 08/03/13   Dr. Katy Fitch @ [facility]  . EYE SURGERY Right    Cataract  . ORIF PATELLA Right 10/29/2019   Procedure: OPEN REDUCTION INTERNAL (ORIF) FIXATION PATELLA;  Surgeon: Meredith Pel, MD;  Location: [facility];  Service: Orthopedics;  Laterality: Right;  . ORIF RADIAL FRACTURE Right 05/02/2020   Procedure: OPEN REDUCTION INTERNAL FIXATION (ORIF) RADIAL FRACTURE;  Surgeon: Cindra Presume, MD;  Location: [facility];  Service: Plastics;  Laterality: Right;  90 min, please  . r hip fracture and repair  January 24, 2010    There were no vitals filed  for this visit.   Subjective Assessment - 06/23/20 1114    Subjective  Pt reports no pain on arrival, only if she over does things    Pertinent History Pt is a 65 y.o female with a diagnosis of comminuted intra-articular right distal radius fx, s/p ORIF by Dr. Claudia Desanctis on 05/02/20. Pt also has a dignosis of closed nondisplaced fracture of middle phalanx of right index finger 10/27/19 for which she has not yet recieved OT.    Limitations 06/02/20 cleared for gentle A/ROM to wrist, 06/16/20 cleared for d/c splint and strengthening    Currently in Pain? No/denies             Treatment: Fluidotherapy x 10 mins , 115* , for pain and stiffness, with pt performing A/ROM while in fluido mild pink coloration however this resolved by end of treatment. Pt was in no distress.Retrograde massage to right digits, hand and wrist. Scar massage to incision and discussion regarding importance of desensitization. AA/ROM,and  P/ROM wrist  flexion extension Pen rolling exercise for IP to composite flexion P/ROM MP, and composite flexion, as wll as individual finger flexion. Reviewed yellow putty exercises, 20 reps each, pt also performed lateral pinch exercise. Forearm gym x 6 reps min v.c for increased A/ROM  OT Short Term Goals - 06/21/20 4580      OT SHORT TERM GOAL #1   Title I with splint wear care and precautions.    Time 4    Period Weeks    Status Achieved      OT SHORT TERM GOAL #2   Title I with inital HEP.    Time 4    Period Weeks    Status Achieved      OT SHORT TERM GOAL #3   Title I with edema control techhniques    Time 4    Period Weeks    Status Achieved      OT SHORT TERM GOAL #4   Title Pt will increase RUE index finger MP flexion to 75  for increased functional use    Time 4    Period Weeks    Status On-going   06/21/20: MP flex = 70*     OT SHORT TERM GOAL #5   Title Pt will increase RUE index finger PIP flexion to 75* and extension to 10 for  increased functional use.    Time 4    Period Weeks    Status On-going   06/21/20: PIP flex = 65*            OT Long Term Goals - 06/21/20 0923      OT LONG TERM GOAL #1   Title I with updated HEP- 08/10/20    Time 12    Period Weeks    Status On-going      OT LONG TERM GOAL #2   Title Pt will demonstrate at least 90% composite finger flexion for RUE.    Time 12    Period Weeks    Status On-going   approx 80%     OT LONG TERM GOAL #3   Title Pt will demonstrate grip strength of at least 25 lbs in RUE for increased functional use.    Time 12    Period Weeks    Status On-going      OT LONG TERM GOAL #4   Title Pt will resume use of RUE as dominant hand at least 90% of the time for ADLs/ IADLS with pain less than or equal to 3/10    Time 12    Period Weeks    Status On-going      OT LONG TERM GOAL #5   Title Pt will demonstrate right wrist flexion / extension WFLS for ADLs.    Time 12    Period Weeks    Status On-going      OT LONG TERM GOAL #6   Title Pt will demonstrate forearm supination/ pronation for at least 85* for increased RUE functional use.    Time 12    Period Weeks    Status New                 Plan - 06/23/20 1135    Clinical Impression Statement Pt is progressing towards goals with improving ROM and hand strength.    OT Occupational Profile and History Problem Focused Assessment - Including review of records relating to presenting problem    Occupational performance deficits (Please refer to evaluation for details): ADL's;IADL's;Rest and Sleep;Work;Leisure;Social Participation    Body Structure / Function / Physical Skills ADL;UE functional use;Pain;Flexibility;FMC;ROM;GMC;Coordination;Decreased knowledge of use of DME;IADL;Decreased knowledge of precautions;Skin integrity;Strength;Dexterity;Edema    Rehab Potential Good    Clinical Decision Making Limited treatment options, no task modification necessary  Comorbidities Affecting Occupational  Performance: May have comorbidities impacting occupational performance    Modification or Assistance to Complete Evaluation  No modification of tasks or assist necessary to complete eval    OT Frequency 2x / week   will start at 1x week due to financial concerns however pt may require 2x week as she is able to perform ROM   OT Duration 12 weeks    OT Treatment/Interventions Self-care/ADL training;Ultrasound;Patient/family education;DME and/or AE instruction;Paraffin;Passive range of motion;Fluidtherapy;Cryotherapy;Electrical Stimulation;Contrast Bath;Splinting;Therapeutic activities;Manual Therapy;Therapeutic exercise;Moist Heat;Neuromuscular education    Plan continue A/ROM and P/ROM to wrist/forearm, putty for hand strengthening, modalities, consider wrist strengthening next visit    Consulted and Agree with Plan of Care Patient           Patient will benefit from skilled therapeutic intervention in order to improve the following deficits and impairments:   Body Structure / Function / Physical Skills: ADL,UE functional use,Pain,Flexibility,FMC,ROM,GMC,Coordination,Decreased knowledge of use of DME,IADL,Decreased knowledge of precautions,Skin integrity,Strength,Dexterity,Edema       Visit Diagnosis: Stiffness of right wrist, not elsewhere classified  Pain in right wrist  Stiffness of right hand, not elsewhere classified  Localized edema  Muscle weakness (generalized)  Pain in right finger(s)    Problem List Patient Active Problem List   Diagnosis Date Noted  . Closed patellar sleeve fracture, left, initial encounter 11/10/2019  . Closed patellar sleeve fracture of right knee 10/28/2019  . Finger fracture, right 10/28/2019  . Conjunctivitis 11/07/2017  . Hyperlipidemia 01/01/2013  . Allergic rhinitis 12/12/2011  . Breast mass, right 11/23/2010  . WEIGHT GAIN 11/07/2009  . Depression, major, single episode, in partial remission ([place]) 09/26/2006  . Essential hypertension  09/26/2006    Marico Buckle 06/23/2020, 11:39 AM  [facility] [address] [place], [place], [address] Phone: [phone]   Fax:  [phone]  Name: April Castillo MRN: [id number] Date of Birth: [birth year]

## 2020-06-28 ENCOUNTER — Other Ambulatory Visit: Payer: Self-pay

## 2020-06-28 ENCOUNTER — Ambulatory Visit: Payer: Medicare Other | Admitting: Occupational Therapy

## 2020-06-28 ENCOUNTER — Encounter: Payer: Self-pay | Admitting: Occupational Therapy

## 2020-06-28 DIAGNOSIS — M79644 Pain in right finger(s): Secondary | ICD-10-CM

## 2020-06-28 DIAGNOSIS — R600 Localized edema: Secondary | ICD-10-CM

## 2020-06-28 DIAGNOSIS — M25641 Stiffness of right hand, not elsewhere classified: Secondary | ICD-10-CM

## 2020-06-28 DIAGNOSIS — M25531 Pain in right wrist: Secondary | ICD-10-CM

## 2020-06-28 DIAGNOSIS — M6281 Muscle weakness (generalized): Secondary | ICD-10-CM

## 2020-06-28 DIAGNOSIS — M25631 Stiffness of right wrist, not elsewhere classified: Secondary | ICD-10-CM | POA: Diagnosis not present

## 2020-06-28 NOTE — Therapy (Signed)
[facility] [address] [place], [place], [address] Phone: [phone]   Fax:  [phone]  Occupational Therapy Treatment  Patient Details  Name: April Castillo MRN: [id number] Date of Birth: 06-30-1955 Referring Provider (OT): Dr. Claudia Desanctis (Dr. Marlou Sa for index finger)   Encounter Date: 06/28/2020   OT End of Session - 06/28/20 1413    Visit Number 8    Number of Visits 24    Date for OT Re-Evaluation 08/10/20    Authorization Type BCBS Medicare    Authorization - Visit Number 8    Authorization - Number of Visits 10    OT Start Time 0354    OT Stop Time 1445    OT Time Calculation (min) 40 min           Past Medical History:  Diagnosis Date  . Depression   . Heart murmur   . Hip fracture, right ([place]) 11/23/2010  . History of kidney stones   . Hypertension   . KNEE PAIN, LEFT 11/07/2009  . Mitral valve prolapse    reported by patient, does not see cardiologist  . Other and unspecified hyperlipidemia 01/01/2013  . PERIMENOPAUSAL SYNDROME 11/07/2009  . Rosacea 06/20/2006  . Tension headache 06/20/2006       . UTERINE FIBROID 06/20/2006    Past Surgical History:  Procedure Laterality Date  . CATARACT EXTRACTION W/ INTRAOCULAR LENS IMPLANT Left 08/03/13   Dr. Katy Fitch @ [facility]  . EYE SURGERY Right    Cataract  . ORIF PATELLA Right 10/29/2019   Procedure: OPEN REDUCTION INTERNAL (ORIF) FIXATION PATELLA;  Surgeon: Meredith Pel, MD;  Location: [facility];  Service: Orthopedics;  Laterality: Right;  . ORIF RADIAL FRACTURE Right 05/02/2020   Procedure: OPEN REDUCTION INTERNAL FIXATION (ORIF) RADIAL FRACTURE;  Surgeon: Cindra Presume, MD;  Location: [facility];  Service: Plastics;  Laterality: Right;  90 min, please  . r hip fracture and repair  January 24, 2010    There were no vitals filed for this visit.   Subjective Assessment - 06/28/20 1413    Subjective  Pt reports no pain on arrival, only if she  over does things    Pertinent History Pt is a 65 y.o female with a diagnosis of comminuted intra-articular right distal radius fx, s/p ORIF by Dr. Claudia Desanctis on 05/02/20. Pt also has a dignosis of closed nondisplaced fracture of middle phalanx of right index finger 10/27/19 for which she has not yet recieved OT.    Limitations 06/02/20 cleared for gentle A/ROM to wrist, 06/16/20 cleared for d/c splint and strengthening    Currently in Pain? No/denies                   Treatment: Fluidotherapy x 10 mins , 110* , for pain and stiffness, with pt performing A/ROM while in fluido mild pink coloration however this resolved by end of treatment. Pt was in no distress.  P/ROM wrist flexion extension Pen rolling exercise for IP to composite flexion Tendon gliding exercise, min v.c P/ROM composite finger flexion . Reviewed yellow putty exercises, 20 reps each, pt also performed lateral pinch exercise.              OT Education - 06/28/20 1440    Education Details reviewed passive wrist flexion/ extension, added strengthening for wrist flexion/ extension, and ulnar/ radial deviation    Person(s) Educated Patient    Methods Explanation;Demonstration;Verbal cues;Handout    Comprehension  Verbalized understanding;Returned demonstration;Verbal cues required            OT Short Term Goals - 06/21/20 0921      OT SHORT TERM GOAL #1   Title I with splint wear care and precautions.    Time 4    Period Weeks    Status Achieved      OT SHORT TERM GOAL #2   Title I with inital HEP.    Time 4    Period Weeks    Status Achieved      OT SHORT TERM GOAL #3   Title I with edema control techhniques    Time 4    Period Weeks    Status Achieved      OT SHORT TERM GOAL #4   Title Pt will increase RUE index finger MP flexion to 75  for increased functional use    Time 4    Period Weeks    Status On-going   06/21/20: MP flex = 70*     OT SHORT TERM GOAL #5   Title Pt will increase RUE index  finger PIP flexion to 75* and extension to 10 for increased functional use.    Time 4    Period Weeks    Status On-going   06/21/20: PIP flex = 65*            OT Long Term Goals - 06/21/20 0923      OT LONG TERM GOAL #1   Title I with updated HEP- 08/10/20    Time 12    Period Weeks    Status On-going      OT LONG TERM GOAL #2   Title Pt will demonstrate at least 90% composite finger flexion for RUE.    Time 12    Period Weeks    Status On-going   approx 80%     OT LONG TERM GOAL #3   Title Pt will demonstrate grip strength of at least 25 lbs in RUE for increased functional use.    Time 12    Period Weeks    Status On-going      OT LONG TERM GOAL #4   Title Pt will resume use of RUE as dominant hand at least 90% of the time for ADLs/ IADLS with pain less than or equal to 3/10    Time 12    Period Weeks    Status On-going      OT LONG TERM GOAL #5   Title Pt will demonstrate right wrist flexion / extension WFLS for ADLs.    Time 12    Period Weeks    Status On-going      OT LONG TERM GOAL #6   Title Pt will demonstrate forearm supination/ pronation for at least 85* for increased RUE functional use.    Time 12    Period Weeks    Status New                 Plan - 06/28/20 1415    Clinical Impression Statement Pt is progressing towards goals with improving ROM and hand strength.    OT Occupational Profile and History Problem Focused Assessment - Including review of records relating to presenting problem    Occupational performance deficits (Please refer to evaluation for details): ADL's;IADL's;Rest and Sleep;Work;Leisure;Social Participation    Body Structure / Function / Physical Skills ADL;UE functional use;Pain;Flexibility;FMC;ROM;GMC;Coordination;Decreased knowledge of use of DME;IADL;Decreased knowledge of precautions;Skin integrity;Strength;Dexterity;Edema    Rehab Potential Good  Clinical Decision Making Limited treatment options, no task modification  necessary    Comorbidities Affecting Occupational Performance: May have comorbidities impacting occupational performance    Modification or Assistance to Complete Evaluation  No modification of tasks or assist necessary to complete eval    OT Frequency 2x / week   will start at 1x week due to financial concerns however pt may require 2x week as she is able to perform ROM   OT Duration 12 weeks    OT Treatment/Interventions Self-care/ADL training;Ultrasound;Patient/family education;DME and/or AE instruction;Paraffin;Passive range of motion;Fluidtherapy;Cryotherapy;Electrical Stimulation;Contrast Bath;Splinting;Therapeutic activities;Manual Therapy;Therapeutic exercise;Moist Heat;Neuromuscular education    Plan continue A/ROM and P/ROM to wrist/forearm, putty for hand strengthening, modalities, consider wrist strengthening next visit    Consulted and Agree with Plan of Care Patient           Patient will benefit from skilled therapeutic intervention in order to improve the following deficits and impairments:   Body Structure / Function / Physical Skills: ADL,UE functional use,Pain,Flexibility,FMC,ROM,GMC,Coordination,Decreased knowledge of use of DME,IADL,Decreased knowledge of precautions,Skin integrity,Strength,Dexterity,Edema       Visit Diagnosis: Stiffness of right wrist, not elsewhere classified  Pain in right wrist  Stiffness of right hand, not elsewhere classified  Localized edema  Muscle weakness (generalized)  Pain in right finger(s)    Problem List Patient Active Problem List   Diagnosis Date Noted  . Closed patellar sleeve fracture, left, initial encounter 11/10/2019  . Closed patellar sleeve fracture of right knee 10/28/2019  . Finger fracture, right 10/28/2019  . Conjunctivitis 11/07/2017  . Hyperlipidemia 01/01/2013  . Allergic rhinitis 12/12/2011  . Breast mass, right 11/23/2010  . WEIGHT GAIN 11/07/2009  . Depression, major, single episode, in partial  remission ([place]) 09/26/2006  . Essential hypertension 09/26/2006    RINE,KATHRYN 06/28/2020, 2:44 PM  [facility] [address] [place], [place], [address] Phone: [phone]   Fax:  [phone]  Name: April Castillo MRN: [id number] Date of Birth: November 28, 1955

## 2020-06-28 NOTE — Patient Instructions (Addendum)
  Composite Extension (Passive Flexor Stretch)    Sitting with elbows on table and palms together, slowly lower wrists toward table until stretch is felt. Be sure to keep palms together throughout stretch. Hold _10___ seconds. Relax. Repeat __5__ times. Do __3__ sessions per day.    Wrist Passive Flexion    Rest right forearm on table, hand palm-down over edge. Bend wrist by pressing hand down with other hand. Hold ____ seconds. Repeat _10___ times. Do 3Composite Extension [employer] analyst)        Wrist Radial Deviation: Resisted    With right thumb up, __1__ pound weight in hand, or hammer bend wrist up. Return slowly. Repeat __10__ times per set. Do ___1_ sets per session. Do __1-2__ sessions per day.  Copyright  VHI. All rights reserved.  Wrist Flexion: Resisted    With right palm up, __1__ pound weight in hand, bend wrist up. Return slowly. Repeat __10__ times per set. Do __1__ sets per session. Do _2___ sessions per day. Repeat with palm down Copyright  VHI. All rights reserved.

## 2020-06-30 ENCOUNTER — Other Ambulatory Visit: Payer: Self-pay

## 2020-06-30 ENCOUNTER — Ambulatory Visit: Payer: Medicare Other | Admitting: Occupational Therapy

## 2020-06-30 DIAGNOSIS — M25631 Stiffness of right wrist, not elsewhere classified: Secondary | ICD-10-CM | POA: Diagnosis not present

## 2020-06-30 DIAGNOSIS — R600 Localized edema: Secondary | ICD-10-CM

## 2020-06-30 DIAGNOSIS — M79644 Pain in right finger(s): Secondary | ICD-10-CM

## 2020-06-30 DIAGNOSIS — M25531 Pain in right wrist: Secondary | ICD-10-CM

## 2020-06-30 DIAGNOSIS — M6281 Muscle weakness (generalized): Secondary | ICD-10-CM

## 2020-06-30 DIAGNOSIS — M25641 Stiffness of right hand, not elsewhere classified: Secondary | ICD-10-CM

## 2020-06-30 NOTE — Therapy (Signed)
[facility] [address] [place], [place], [address] Phone: [phone]   Fax:  [phone]  Occupational Therapy Treatment  Patient Details  Name: April Castillo MRN: [id number] Date of Birth: 07-31-1955 Referring Provider (OT): Dr. Claudia Desanctis (Dr. Marlou Sa for index finger)   Encounter Date: 06/30/2020   OT End of Session - 06/30/20 1015    Visit Number 9    Number of Visits 24    Date for OT Re-Evaluation 08/10/20    Authorization Type BCBS Medicare    Authorization - Visit Number 9    Authorization - Number of Visits 10    OT Start Time 1013    OT Stop Time 1057    OT Time Calculation (min) 44 min           Past Medical History:  Diagnosis Date  . Depression   . Heart murmur   . Hip fracture, right ([place]) 11/23/2010  . History of kidney stones   . Hypertension   . KNEE PAIN, LEFT 11/07/2009  . Mitral valve prolapse    reported by patient, does not see cardiologist  . Other and unspecified hyperlipidemia 01/01/2013  . PERIMENOPAUSAL SYNDROME 11/07/2009  . Rosacea 06/20/2006  . Tension headache 06/20/2006       . UTERINE FIBROID 06/20/2006    Past Surgical History:  Procedure Laterality Date  . CATARACT EXTRACTION W/ INTRAOCULAR LENS IMPLANT Left 08/03/13   Dr. Katy Fitch @ [facility] [place]  . EYE SURGERY Right    Cataract  . ORIF PATELLA Right 10/29/2019   Procedure: OPEN REDUCTION INTERNAL (ORIF) FIXATION PATELLA;  Surgeon: Meredith Pel, MD;  Location: [facility];  Service: Orthopedics;  Laterality: Right;  . ORIF RADIAL FRACTURE Right 05/02/2020   Procedure: OPEN REDUCTION INTERNAL FIXATION (ORIF) RADIAL FRACTURE;  Surgeon: Cindra Presume, MD;  Location: [facility];  Service: Plastics;  Laterality: Right;  90 min, please  . r hip fracture and repair  January 24, 2010    There were no vitals filed for this visit.   Subjective Assessment - 06/30/20 1014    Subjective  Pt reports no pain on arrival, pt reports  she scould tell she had exercised after last visit    Pertinent History Pt is a 65 y.o female with a diagnosis of comminuted intra-articular right distal radius fx, s/p ORIF by Dr. Claudia Desanctis on 05/02/20. Pt also has a dignosis of closed nondisplaced fracture of middle phalanx of right index finger 10/27/19 for which she has not yet recieved OT.    Limitations 06/02/20 cleared for gentle A/ROM to wrist, 06/16/20 cleared for d/c splint and strengthening    Currently in Pain? No/denies                 Treatment:: Fluidotherapy x 10 mins , 110* , for pain and stiffness, with pt performing A/ROM while in fluidotherapy. No adverse reactions.   P/ROM wrist flexion/ extension, PROM composite flexion /extension, isolated PIP blocking for index finger for flexion/ extension, Tendon gliding exercise, min v.c  A/ROM with 1 lbs weight 15-20 reps  :Wrist flexion/ extension,  ulnar radial deviation,  supination pronation with hammer, wrist winder x 3 reps each direction with 1 lbs weight Graded clothespins 1-8 lbs for sustained pinch with RUE                      OT Short Term Goals - 06/21/20 1607      OT  SHORT TERM GOAL #1   Title I with splint wear care and precautions.    Time 4    Period Weeks    Status Achieved      OT SHORT TERM GOAL #2   Title I with inital HEP.    Time 4    Period Weeks    Status Achieved      OT SHORT TERM GOAL #3   Title I with edema control techhniques    Time 4    Period Weeks    Status Achieved      OT SHORT TERM GOAL #4   Title Pt will increase RUE index finger MP flexion to 75  for increased functional use    Time 4    Period Weeks    Status On-going   06/21/20: MP flex = 70*     OT SHORT TERM GOAL #5   Title Pt will increase RUE index finger PIP flexion to 75* and extension to 10 for increased functional use.    Time 4    Period Weeks    Status On-going   06/21/20: PIP flex = 65*            OT Long Term Goals - 06/21/20 0923      OT  LONG TERM GOAL #1   Title I with updated HEP- 08/10/20    Time 12    Period Weeks    Status On-going      OT LONG TERM GOAL #2   Title Pt will demonstrate at least 90% composite finger flexion for RUE.    Time 12    Period Weeks    Status On-going   approx 80%     OT LONG TERM GOAL #3   Title Pt will demonstrate grip strength of at least 25 lbs in RUE for increased functional use.    Time 12    Period Weeks    Status On-going      OT LONG TERM GOAL #4   Title Pt will resume use of RUE as dominant hand at least 90% of the time for ADLs/ IADLS with pain less than or equal to 3/10    Time 12    Period Weeks    Status On-going      OT LONG TERM GOAL #5   Title Pt will demonstrate right wrist flexion / extension WFLS for ADLs.    Time 12    Period Weeks    Status On-going      OT LONG TERM GOAL #6   Title Pt will demonstrate forearm supination/ pronation for at least 85* for increased RUE functional use.    Time 12    Period Weeks    Status New                 Plan - 06/30/20 1020    Clinical Impression Statement Pt is progressing towards goals with improving ROM and strength.    OT Occupational Profile and History Problem Focused Assessment - Including review of records relating to presenting problem    Occupational performance deficits (Please refer to evaluation for details): ADL's;IADL's;Rest and Sleep;Work;Leisure;Social Participation    Body Structure / Function / Physical Skills ADL;UE functional use;Pain;Flexibility;FMC;ROM;GMC;Coordination;Decreased knowledge of use of DME;IADL;Decreased knowledge of precautions;Skin integrity;Strength;Dexterity;Edema    Rehab Potential Good    Comorbidities Affecting Occupational Performance: May have comorbidities impacting occupational performance    Modification or Assistance to Complete Evaluation  No modification of tasks or assist necessary  to complete eval    OT Frequency 2x / week    OT Duration 12 weeks    OT  Treatment/Interventions Self-care/ADL training;Ultrasound;Patient/family education;DME and/or AE instruction;Paraffin;Passive range of motion;Fluidtherapy;Cryotherapy;Electrical Stimulation;Contrast Bath;Splinting;Therapeutic activities;Manual Therapy;Therapeutic exercise;Moist Heat;Neuromuscular education    Plan continue wrist strengthening    Consulted and Agree with Plan of Care Patient           Patient will benefit from skilled therapeutic intervention in order to improve the following deficits and impairments:   Body Structure / Function / Physical Skills: ADL,UE functional use,Pain,Flexibility,FMC,ROM,GMC,Coordination,Decreased knowledge of use of DME,IADL,Decreased knowledge of precautions,Skin integrity,Strength,Dexterity,Edema       Visit Diagnosis: Stiffness of right wrist, not elsewhere classified  Pain in right wrist  Stiffness of right hand, not elsewhere classified  Localized edema  Muscle weakness (generalized)  Pain in right finger(s)    Problem List Patient Active Problem List   Diagnosis Date Noted  . Closed patellar sleeve fracture, left, initial encounter 11/10/2019  . Closed patellar sleeve fracture of right knee 10/28/2019  . Finger fracture, right 10/28/2019  . Conjunctivitis 11/07/2017  . Hyperlipidemia 01/01/2013  . Allergic rhinitis 12/12/2011  . Breast mass, right 11/23/2010  . WEIGHT GAIN 11/07/2009  . Depression, major, single episode, in partial remission ([place]) 09/26/2006  . Essential hypertension 09/26/2006    Reyonna Haack 06/30/2020, 10:21 AM  [facility] [address] [place], [place], [address] Phone: [phone]   Fax:  [phone]  Name: DELSY ETZKORN MRN: [id number] Date of Birth: [birth year]

## 2020-07-05 ENCOUNTER — Other Ambulatory Visit: Payer: Self-pay

## 2020-07-05 ENCOUNTER — Ambulatory Visit: Payer: Medicare Other | Admitting: Occupational Therapy

## 2020-07-05 DIAGNOSIS — M25641 Stiffness of right hand, not elsewhere classified: Secondary | ICD-10-CM

## 2020-07-05 DIAGNOSIS — M6281 Muscle weakness (generalized): Secondary | ICD-10-CM

## 2020-07-05 DIAGNOSIS — M25631 Stiffness of right wrist, not elsewhere classified: Secondary | ICD-10-CM | POA: Diagnosis not present

## 2020-07-05 DIAGNOSIS — M25531 Pain in right wrist: Secondary | ICD-10-CM

## 2020-07-05 DIAGNOSIS — M79644 Pain in right finger(s): Secondary | ICD-10-CM

## 2020-07-05 DIAGNOSIS — R600 Localized edema: Secondary | ICD-10-CM

## 2020-07-05 NOTE — Therapy (Signed)
[name] [address] [place], [place], [address] Phone: [phone]   Fax:  [phone]  Occupational Therapy Treatment  Patient Details  Name: April Castillo MRN: [id number] Date of Birth: 01-24-1956 Referring Provider (OT): Dr. Claudia Desanctis (Dr. Marlou Sa for index finger)   Encounter Date: 07/05/2020   OT End of Session - 07/05/20 1426    Visit Number 10    Number of Visits 24    Date for OT Re-Evaluation 08/10/20    Authorization Type BCBS Medicare    Authorization - Visit Number 10    Authorization - Number of Visits 10    OT Start Time 7654    OT Stop Time 1445    OT Time Calculation (min) 41 min           Past Medical History:  Diagnosis Date  . Depression   . Heart murmur   . Hip fracture, right ([place]) 11/23/2010  . History of kidney stones   . Hypertension   . KNEE PAIN, LEFT 11/07/2009  . Mitral valve prolapse    reported by patient, does not see cardiologist  . Other and unspecified hyperlipidemia 01/01/2013  . PERIMENOPAUSAL SYNDROME 11/07/2009  . Rosacea 06/20/2006  . Tension headache 06/20/2006       . UTERINE FIBROID 06/20/2006    Past Surgical History:  Procedure Laterality Date  . CATARACT EXTRACTION W/ INTRAOCULAR LENS IMPLANT Left 08/03/13   Dr. Katy Fitch @ [facility]  . EYE SURGERY Right    Cataract  . ORIF PATELLA Right 10/29/2019   Procedure: OPEN REDUCTION INTERNAL (ORIF) FIXATION PATELLA;  Surgeon: Meredith Pel, MD;  Location: [facility];  Service: Orthopedics;  Laterality: Right;  . ORIF RADIAL FRACTURE Right 05/02/2020   Procedure: OPEN REDUCTION INTERNAL FIXATION (ORIF) RADIAL FRACTURE;  Surgeon: Cindra Presume, MD;  Location: [facility];  Service: Plastics;  Laterality: Right;  90 min, please  . r hip fracture and repair  January 24, 2010    There were no vitals filed for this visit.   Subjective Assessment - 07/05/20 1419    Subjective  Pt reports no pain on arrival,pt  reports pain only with stretching    Pertinent History Pt is a 65 y.o female with a diagnosis of comminuted intra-articular right distal radius fx, s/p ORIF by Dr. Claudia Desanctis on 05/02/20. Pt also has a dignosis of closed nondisplaced fracture of middle phalanx of right index finger 10/27/19 for which she has not yet recieved OT.    Limitations 06/02/20 cleared for gentle A/ROM to wrist, 06/16/20 cleared for d/c splint and strengthening    Currently in Pain? No/denies                Treatment:: Fluidotherapy x 10 mins , 110* , for pain and stiffness, with pt performing A/ROM while in fluidotherapy. No adverse reactions.   P/ROM wrist flexion/ extension, PROM composite flexion /extension, isolated PIP blocking for index finger for flexion/ extension,  Tendon gliding exercise, min v.c  A/ROM with 2 lbs weight 15-20 reps  :Wrist flexion/ extension,  ulnar radial deviation, min v.c supination pronation with hammer, wrist winder x 3 reps each direction with 2 lbs weight Graded clothespins 1-8 lbs for sustained pinch with RUE Pt demonstrates improving index finger ROM.                    OT Short Term Goals - 07/05/20 1432      OT SHORT  TERM GOAL #1   Title I with splint wear care and precautions.    Time 4    Period Weeks    Status Achieved      OT SHORT TERM GOAL #2   Title I with inital HEP.    Time 4    Period Weeks    Status Achieved      OT SHORT TERM GOAL #3   Title I with edema control techhniques    Time 4    Period Weeks    Status Achieved      OT SHORT TERM GOAL #4   Title Pt will increase RUE index finger MP flexion to 75  for increased functional use    Time 4    Period Weeks    Status Achieved   75     OT SHORT TERM GOAL #5   Title Pt will increase RUE index finger PIP flexion to 75* and extension to 10 for increased functional use.    Time 4    Period Weeks    Status On-going   68, -15, met for flexion            OT Long Term Goals - 07/05/20  1433      OT LONG TERM GOAL #1   Title I with updated HEP- 08/10/20    Time 12    Period Weeks    Status On-going      OT LONG TERM GOAL #2   Title Pt will demonstrate at least 90% composite finger flexion for RUE.    Time 12    Period Weeks    Status On-going   approx 80%     OT LONG TERM GOAL #3   Title Pt will demonstrate grip strength of at least 25 lbs in RUE for increased functional use.    Time 12    Period Weeks    Status On-going      OT LONG TERM GOAL #4   Title Pt will resume use of RUE as dominant hand at least 90% of the time for ADLs/ IADLS with pain less than or equal to 3/10    Time 12    Period Weeks    Status On-going      OT LONG TERM GOAL #5   Title Pt will demonstrate right wrist flexion / extension WFLS for ADLs.    Time 12    Period Weeks    Status On-going      OT LONG TERM GOAL #6   Title Pt will demonstrate forearm supination/ pronation for at least 85* for increased RUE functional use.    Time 12    Period Weeks    Status New                 Plan - 07/05/20 1421    Clinical Impression Statement For the reporting period of :05/18/20-07/05/20,  Pt is progressing towards goals with improving ROM and strength. Pt achieved 4/5 short term goals.Index finger ROM remains limited as well as wrist extension. Pt can benefit from skilled occupational therapy to address RUE strength and ROM in order to maximize pt's safety and I with daily activities.    OT Occupational Profile and History Problem Focused Assessment - Including review of records relating to presenting problem    Occupational performance deficits (Please refer to evaluation for details): ADL's;IADL's;Rest and Sleep;Work;Leisure;Social Participation    Body Structure / Function / Physical Skills ADL;UE functional use;Pain;Flexibility;FMC;ROM;GMC;Coordination;Decreased knowledge of use of  DME;IADL;Decreased knowledge of precautions;Skin integrity;Strength;Dexterity;Edema    Rehab Potential  Good    Comorbidities Affecting Occupational Performance: May have comorbidities impacting occupational performance    Modification or Assistance to Complete Evaluation  No modification of tasks or assist necessary to complete eval    OT Frequency 2x / week    OT Duration 12 weeks    OT Treatment/Interventions Self-care/ADL training;Ultrasound;Patient/family education;DME and/or AE instruction;Paraffin;Passive range of motion;Fluidtherapy;Cryotherapy;Electrical Stimulation;Contrast Bath;Splinting;Therapeutic activities;Manual Therapy;Therapeutic exercise;Moist Heat;Neuromuscular education    Plan continue wrist strengthening, wrist/ finger ROM, putty exercises    Consulted and Agree with Plan of Care Patient           Patient will benefit from skilled therapeutic intervention in order to improve the following deficits and impairments:   Body Structure / Function / Physical Skills: ADL,UE functional use,Pain,Flexibility,FMC,ROM,GMC,Coordination,Decreased knowledge of use of DME,IADL,Decreased knowledge of precautions,Skin integrity,Strength,Dexterity,Edema       Visit Diagnosis: No diagnosis found.    Problem List Patient Active Problem List   Diagnosis Date Noted  . Closed patellar sleeve fracture, left, initial encounter 11/10/2019  . Closed patellar sleeve fracture of right knee 10/28/2019  . Finger fracture, right 10/28/2019  . Conjunctivitis 11/07/2017  . Hyperlipidemia 01/01/2013  . Allergic rhinitis 12/12/2011  . Breast mass, right 11/23/2010  . WEIGHT GAIN 11/07/2009  . Depression, major, single episode, in partial remission ([place]) 09/26/2006  . Essential hypertension 09/26/2006    Deem Marmol 07/05/2020, 2:40 PM  [facility] [address] [place], [place], [address] Phone: [phone]   Fax:  [phone]  Name: April Castillo MRN: [id number] Date of Birth: 1955-10-17

## 2020-07-07 ENCOUNTER — Ambulatory Visit: Payer: Medicare Other | Admitting: Occupational Therapy

## 2020-07-07 ENCOUNTER — Other Ambulatory Visit: Payer: Self-pay

## 2020-07-07 ENCOUNTER — Encounter: Payer: Self-pay | Admitting: Occupational Therapy

## 2020-07-07 DIAGNOSIS — M6281 Muscle weakness (generalized): Secondary | ICD-10-CM

## 2020-07-07 DIAGNOSIS — M25631 Stiffness of right wrist, not elsewhere classified: Secondary | ICD-10-CM | POA: Diagnosis not present

## 2020-07-07 DIAGNOSIS — M25641 Stiffness of right hand, not elsewhere classified: Secondary | ICD-10-CM

## 2020-07-07 DIAGNOSIS — M25531 Pain in right wrist: Secondary | ICD-10-CM

## 2020-07-07 DIAGNOSIS — M79644 Pain in right finger(s): Secondary | ICD-10-CM

## 2020-07-07 DIAGNOSIS — R600 Localized edema: Secondary | ICD-10-CM

## 2020-07-07 NOTE — Therapy (Signed)
[facility] [address] [place], [place], [address] Phone: [phone]   Fax:  [phone]  Occupational Therapy Treatment  Patient Details  Name: April Castillo MRN: [id number] Date of Birth: 1955-08-03 Referring Provider (OT): Dr. Claudia Desanctis (Dr. Marlou Sa for index finger)   Encounter Date: 07/07/2020   OT End of Session - 07/07/20 1107    Visit Number 11    Number of Visits 24    Date for OT Re-Evaluation 08/10/20    Authorization Type BCBS Medicare    Authorization - Visit Number 11    Authorization - Number of Visits 20    OT Start Time 1104    OT Stop Time 1145    OT Time Calculation (min) 41 min    Activity Tolerance Patient tolerated treatment well    Behavior During Therapy [facility] for tasks assessed/performed           Past Medical History:  Diagnosis Date  . Depression   . Heart murmur   . Hip fracture, right ([facility]) 11/23/2010  . History of kidney stones   . Hypertension   . KNEE PAIN, LEFT 11/07/2009  . Mitral valve prolapse    reported by patient, does not see cardiologist  . Other and unspecified hyperlipidemia 01/01/2013  . PERIMENOPAUSAL SYNDROME 11/07/2009  . Rosacea 06/20/2006  . Tension headache 06/20/2006       . UTERINE FIBROID 06/20/2006    Past Surgical History:  Procedure Laterality Date  . CATARACT EXTRACTION W/ INTRAOCULAR LENS IMPLANT Left 08/03/13   Dr. Katy Fitch @ [facility]  . EYE SURGERY Right    Cataract  . ORIF PATELLA Right 10/29/2019   Procedure: OPEN REDUCTION INTERNAL (ORIF) FIXATION PATELLA;  Surgeon: Meredith Pel, MD;  Location: [facility];  Service: Orthopedics;  Laterality: Right;  . ORIF RADIAL FRACTURE Right 05/02/2020   Procedure: OPEN REDUCTION INTERNAL FIXATION (ORIF) RADIAL FRACTURE;  Surgeon: Cindra Presume, MD;  Location: [facility];  Service: Plastics;  Laterality: Right;  90 min, please  . r hip fracture and repair  January 24, 2010    There were no vitals  filed for this visit.   Subjective Assessment - 07/07/20 1106    Subjective  Pt reports no pain on arrival, just stiffness    Pertinent History Pt is a 65 y.o female with a diagnosis of comminuted intra-articular right distal radius fx, s/p ORIF by Dr. Claudia Desanctis on 05/02/20. Pt also has a dignosis of closed nondisplaced fracture of middle phalanx of right index finger 10/27/19 for which she has not yet recieved OT.    Limitations 06/02/20 cleared for gentle A/ROM to wrist, 06/16/20 cleared for d/c splint and strengthening    Currently in Pain? No/denies              Fluidotherapy x 10 mins , 115* , for pain and stiffness, with pt performing A/ROM while in fluidotherapy. No adverse reactions.    PROM wrist flexion/extension, PROM composite flexion /extension, isolated PIP blocking for index finger for flexion/ extension, isolated IP flex, with MP blocked in flex isolated IP ext   AROM:  Tendon gliding exercise, min v.c, isolated MP flex/ext, finger abduction/adduction   A/ROM with 2 lbs weight 15-20 reps, Wrist flexion/ extension,  ulnar radial deviation, min v.c  Supination/pronation with hammer for strength and stretch  Yellow putty exercises for gross grasp, individual finger pinch, lateral pinch, isolated 2nd digit composite flex, and isolated IP flex as able.  OT Short Term Goals - 07/05/20 1432      OT SHORT TERM GOAL #1   Title I with splint wear care and precautions.    Time 4    Period Weeks    Status Achieved      OT SHORT TERM GOAL #2   Title I with inital HEP.    Time 4    Period Weeks    Status Achieved      OT SHORT TERM GOAL #3   Title I with edema control techhniques    Time 4    Period Weeks    Status Achieved      OT SHORT TERM GOAL #4   Title Pt will increase RUE index finger MP flexion to 75  for increased functional use    Time 4    Period Weeks    Status Achieved   75     OT SHORT TERM GOAL #5   Title Pt will increase RUE index finger PIP  flexion to 75* and extension to 10 for increased functional use.    Time 4    Period Weeks    Status On-going   94, -15, met for flexion            OT Long Term Goals - 07/05/20 1433      OT LONG TERM GOAL #1   Title I with updated HEP- 08/10/20    Time 12    Period Weeks    Status On-going      OT LONG TERM GOAL #2   Title Pt will demonstrate at least 90% composite finger flexion for RUE.    Time 12    Period Weeks    Status On-going   approx 80%     OT LONG TERM GOAL #3   Title Pt will demonstrate grip strength of at least 25 lbs in RUE for increased functional use.    Time 12    Period Weeks    Status On-going      OT LONG TERM GOAL #4   Title Pt will resume use of RUE as dominant hand at least 90% of the time for ADLs/ IADLS with pain less than or equal to 3/10    Time 12    Period Weeks    Status On-going      OT LONG TERM GOAL #5   Title Pt will demonstrate right wrist flexion / extension WFLS for ADLs.    Time 12    Period Weeks    Status On-going      OT LONG TERM GOAL #6   Title Pt will demonstrate forearm supination/ pronation for at least 85* for increased RUE functional use.    Time 12    Period Weeks    Status New                 Plan - 07/07/20 1108    Clinical Impression Statement Pt is progressing towards goals with improving strength and ROM.  Pt continues to demo stiffness and edema.    OT Occupational Profile and History Problem Focused Assessment - Including review of records relating to presenting problem    Occupational performance deficits (Please refer to evaluation for details): ADL's;IADL's;Rest and Sleep;Work;Leisure;Social Participation    Body Structure / Function / Physical Skills ADL;UE functional use;Pain;Flexibility;FMC;ROM;GMC;Coordination;Decreased knowledge of use of DME;IADL;Decreased knowledge of precautions;Skin integrity;Strength;Dexterity;Edema    Rehab Potential Good    Comorbidities Affecting Occupational  Performance: May have comorbidities impacting occupational  performance    Modification or Assistance to Complete Evaluation  No modification of tasks or assist necessary to complete eval    OT Frequency 2x / week    OT Duration 12 weeks    OT Treatment/Interventions Self-care/ADL training;Ultrasound;Patient/family education;DME and/or AE instruction;Paraffin;Passive range of motion;Fluidtherapy;Cryotherapy;Electrical Stimulation;Contrast Bath;Splinting;Therapeutic activities;Manual Therapy;Therapeutic exercise;Moist Heat;Neuromuscular education    Plan continue wrist strengthening, wrist/ finger ROM, putty exercises    Consulted and Agree with Plan of Care Patient           Patient will benefit from skilled therapeutic intervention in order to improve the following deficits and impairments:   Body Structure / Function / Physical Skills: ADL,UE functional use,Pain,Flexibility,FMC,ROM,GMC,Coordination,Decreased knowledge of use of DME,IADL,Decreased knowledge of precautions,Skin integrity,Strength,Dexterity,Edema       Visit Diagnosis: Stiffness of right wrist, not elsewhere classified  Pain in right wrist  Stiffness of right hand, not elsewhere classified  Localized edema  Muscle weakness (generalized)  Pain in right finger(s)    Problem List Patient Active Problem List   Diagnosis Date Noted  . Closed patellar sleeve fracture, left, initial encounter 11/10/2019  . Closed patellar sleeve fracture of right knee 10/28/2019  . Finger fracture, right 10/28/2019  . Conjunctivitis 11/07/2017  . Hyperlipidemia 01/01/2013  . Allergic rhinitis 12/12/2011  . Breast mass, right 11/23/2010  . WEIGHT GAIN 11/07/2009  . Depression, major, single episode, in partial remission ([place]) 09/26/2006  . Essential hypertension 09/26/2006    [facility] 07/07/2020, 12:36 PM  [facility] [address] [place], [place],  [address] Phone: [phone]   Fax:  [phone]  Name: April Castillo MRN: [id number] Date of Birth: [birth year]   Vianne Bulls, OTR/L [facility] [address] [place], [place]  [address] [phone] phone [phone] 07/07/20 12:36 PM

## 2020-07-14 ENCOUNTER — Ambulatory Visit: Payer: Medicare Other | Admitting: Occupational Therapy

## 2020-07-14 ENCOUNTER — Other Ambulatory Visit: Payer: Self-pay

## 2020-07-14 DIAGNOSIS — R600 Localized edema: Secondary | ICD-10-CM

## 2020-07-14 DIAGNOSIS — M79644 Pain in right finger(s): Secondary | ICD-10-CM

## 2020-07-14 DIAGNOSIS — M25631 Stiffness of right wrist, not elsewhere classified: Secondary | ICD-10-CM | POA: Diagnosis not present

## 2020-07-14 DIAGNOSIS — M6281 Muscle weakness (generalized): Secondary | ICD-10-CM

## 2020-07-14 DIAGNOSIS — M25641 Stiffness of right hand, not elsewhere classified: Secondary | ICD-10-CM

## 2020-07-14 DIAGNOSIS — M25531 Pain in right wrist: Secondary | ICD-10-CM

## 2020-07-14 NOTE — Therapy (Signed)
[facility] [address] [place], [place], [address] Phone: [phone]   Fax:  [phone]  Occupational Therapy Treatment  Patient Details  Name: April Castillo MRN: [id number] Date of Birth: 07-23-1955 Referring Provider (OT): Dr. Claudia Desanctis (Dr. Marlou Sa for index finger)   Encounter Date: 07/14/2020   OT End of Session - 07/14/20 1122    Visit Number 12    Number of Visits 24    Date for OT Re-Evaluation 08/10/20    Authorization Type BCBS Medicare    Authorization - Visit Number 12    Authorization - Number of Visits 20    OT Start Time 1100    OT Stop Time 1140    OT Time Calculation (min) 40 min           Past Medical History:  Diagnosis Date  . Depression   . Heart murmur   . Hip fracture, right ([place]) 11/23/2010  . History of kidney stones   . Hypertension   . KNEE PAIN, LEFT 11/07/2009  . Mitral valve prolapse    reported by patient, does not see cardiologist  . Other and unspecified hyperlipidemia 01/01/2013  . PERIMENOPAUSAL SYNDROME 11/07/2009  . Rosacea 06/20/2006  . Tension headache 06/20/2006       . UTERINE FIBROID 06/20/2006    Past Surgical History:  Procedure Laterality Date  . CATARACT EXTRACTION W/ INTRAOCULAR LENS IMPLANT Left 08/03/13   Dr. Katy Fitch @ [facility]  . EYE SURGERY Right    Cataract  . ORIF PATELLA Right 10/29/2019   Procedure: OPEN REDUCTION INTERNAL (ORIF) FIXATION PATELLA;  Surgeon: Meredith Pel, MD;  Location: [facility];  Service: Orthopedics;  Laterality: Right;  . ORIF RADIAL FRACTURE Right 05/02/2020   Procedure: OPEN REDUCTION INTERNAL FIXATION (ORIF) RADIAL FRACTURE;  Surgeon: Cindra Presume, MD;  Location: [facility];  Service: Plastics;  Laterality: Right;  90 min, please  . r hip fracture and repair  January 24, 2010    There were no vitals filed for this visit.   Subjective Assessment - 07/14/20 1138    Subjective  Pt reports no pain on arrival     Limitations 06/02/20 cleared for gentle A/ROM to wrist, 06/16/20 cleared for d/c splint and strengthening    Currently in Pain? No/denies                     Fluidotherapy x 10 mins , 115* , for pain and stiffness, with pt performing A/ROM while in fluidotherapy. No adverse reactions.    PROM wrist flexion/extension, PROM composite finger  flexion /extension, isolated PIP blocking for index finger for flexion/ extension,     A/ROM with 2 lbs weight 15-20 reps, ulnar radial deviation,Wrist flexion/ extension, passive stretch with 3 lbs weight in extension x 5 reps , min v.c  Supination/pronation with hammer for strength and stretch  Upgraded to red putty exercises for gross grasp, and pinch  Stacking and manipulating coins then placing grooved pegs into pegboard with RUE, min difficulty/ v.c             OT Education - 07/14/20 1134    Education Details upgraded to red putty for grip and pinch 10-20 reps each, min vc.    Person(s) Educated Patient    Methods Explanation;Demonstration;Verbal cues;Handout    Comprehension Verbalized understanding;Returned demonstration;Verbal cues required            OT Short Term Goals - 07/05/20 1432  OT SHORT TERM GOAL #1   Title I with splint wear care and precautions.    Time 4    Period Weeks    Status Achieved      OT SHORT TERM GOAL #2   Title I with inital HEP.    Time 4    Period Weeks    Status Achieved      OT SHORT TERM GOAL #3   Title I with edema control techhniques    Time 4    Period Weeks    Status Achieved      OT SHORT TERM GOAL #4   Title Pt will increase RUE index finger MP flexion to 75  for increased functional use    Time 4    Period Weeks    Status Achieved   75     OT SHORT TERM GOAL #5   Title Pt will increase RUE index finger PIP flexion to 75* and extension to 10 for increased functional use.    Time 4    Period Weeks    Status On-going   43, -15, met for flexion             OT Long Term Goals - 07/14/20 1134      OT LONG TERM GOAL #1   Title I with updated HEP- 08/10/20    Time 12    Period Weeks    Status On-going      OT LONG TERM GOAL #2   Title Pt will demonstrate at least 90% composite finger flexion for RUE.    Time 12    Period Weeks    Status On-going   approx 80%     OT LONG TERM GOAL #3   Title Pt will demonstrate grip strength of at least 25 lbs in RUE for increased functional use.    Time 12    Period Weeks    Status On-going      OT LONG TERM GOAL #4   Title Pt will resume use of RUE as dominant hand at least 90% of the time for ADLs/ IADLS with pain less than or equal to 3/10    Time 12    Period Weeks    Status On-going      OT LONG TERM GOAL #5   Title Pt will demonstrate right wrist flexion / extension WFLS for ADLs.    Time 12    Period Weeks    Status On-going      OT LONG TERM GOAL #6   Title Pt will demonstrate forearm supination/ pronation for at least 85* for increased RUE functional use.    Time 12    Period Weeks    Status New                 Plan - 07/14/20 1123    Clinical Impression Statement Pt is progressing towards goals with improving strength and ROM.    OT Occupational Profile and History Problem Focused Assessment - Including review of records relating to presenting problem    Occupational performance deficits (Please refer to evaluation for details): ADL's;IADL's;Rest and Sleep;Work;Leisure;Social Participation    Body Structure / Function / Physical Skills ADL;UE functional use;Pain;Flexibility;FMC;ROM;GMC;Coordination;Decreased knowledge of use of DME;IADL;Decreased knowledge of precautions;Skin integrity;Strength;Dexterity;Edema    Rehab Potential Good    Comorbidities Affecting Occupational Performance: May have comorbidities impacting occupational performance    Modification or Assistance to Complete Evaluation  No modification of tasks or assist necessary to complete eval  OT  Frequency 2x / week    OT Duration 12 weeks    OT Treatment/Interventions Self-care/ADL training;Ultrasound;Patient/family education;DME and/or AE instruction;Paraffin;Passive range of motion;Fluidtherapy;Cryotherapy;Electrical Stimulation;Contrast Bath;Splinting;Therapeutic activities;Manual Therapy;Therapeutic exercise;Moist Heat;Neuromuscular education    Plan progress wrist strengthening, wrist/ finger ROM, putty exercises    Consulted and Agree with Plan of Care Patient           Patient will benefit from skilled therapeutic intervention in order to improve the following deficits and impairments:   Body Structure / Function / Physical Skills: ADL,UE functional use,Pain,Flexibility,FMC,ROM,GMC,Coordination,Decreased knowledge of use of DME,IADL,Decreased knowledge of precautions,Skin integrity,Strength,Dexterity,Edema       Visit Diagnosis: Stiffness of right wrist, not elsewhere classified  Pain in right wrist  Stiffness of right hand, not elsewhere classified  Localized edema  Muscle weakness (generalized)  Pain in right finger(s)    Problem List Patient Active Problem List   Diagnosis Date Noted  . Closed patellar sleeve fracture, left, initial encounter 11/10/2019  . Closed patellar sleeve fracture of right knee 10/28/2019  . Finger fracture, right 10/28/2019  . Conjunctivitis 11/07/2017  . Hyperlipidemia 01/01/2013  . Allergic rhinitis 12/12/2011  . Breast mass, right 11/23/2010  . WEIGHT GAIN 11/07/2009  . Depression, major, single episode, in partial remission ([facility]) 09/26/2006  . Essential hypertension 09/26/2006    Notnamed Scholz 07/14/2020, 11:39 AM  [facility] [address] [place], [place], [address] Phone: [phone]   Fax:  [phone]  Name: April Castillo MRN: [id number] Date of Birth: 11/20/1955

## 2020-07-19 ENCOUNTER — Ambulatory Visit: Payer: Medicare Other | Admitting: Occupational Therapy

## 2020-07-21 ENCOUNTER — Ambulatory Visit: Payer: Medicare Other | Admitting: Occupational Therapy

## 2020-07-21 ENCOUNTER — Other Ambulatory Visit: Payer: Self-pay

## 2020-07-21 DIAGNOSIS — M25631 Stiffness of right wrist, not elsewhere classified: Secondary | ICD-10-CM | POA: Diagnosis not present

## 2020-07-21 DIAGNOSIS — M79644 Pain in right finger(s): Secondary | ICD-10-CM

## 2020-07-21 DIAGNOSIS — R600 Localized edema: Secondary | ICD-10-CM

## 2020-07-21 DIAGNOSIS — M6281 Muscle weakness (generalized): Secondary | ICD-10-CM

## 2020-07-21 DIAGNOSIS — M25531 Pain in right wrist: Secondary | ICD-10-CM

## 2020-07-21 DIAGNOSIS — M25641 Stiffness of right hand, not elsewhere classified: Secondary | ICD-10-CM

## 2020-07-21 NOTE — Therapy (Signed)
[facility] [address] [place], [place], [address] Phone: [phone]   Fax:  [phone]  Occupational Therapy Treatment  Patient Details  Name: April Castillo MRN: [id number] Date of Birth: February 19, 1956 Referring Provider (OT): Dr. Claudia Desanctis (Dr. Marlou Sa for index finger)   Encounter Date: 07/21/2020   OT End of Session - 07/21/20 1114    Visit Number 13    Number of Visits 24    Date for OT Re-Evaluation 08/10/20    Authorization Type BCBS Medicare    Authorization - Visit Number 13    Authorization - Number of Visits 20    OT Start Time 1102    OT Stop Time 1142    OT Time Calculation (min) 40 min    Behavior During Therapy [facility] for tasks assessed/performed           Past Medical History:  Diagnosis Date  . Depression   . Heart murmur   . Hip fracture, right ([place]) 11/23/2010  . History of kidney stones   . Hypertension   . KNEE PAIN, LEFT 11/07/2009  . Mitral valve prolapse    reported by patient, does not see cardiologist  . Other and unspecified hyperlipidemia 01/01/2013  . PERIMENOPAUSAL SYNDROME 11/07/2009  . Rosacea 06/20/2006  . Tension headache 06/20/2006       . UTERINE FIBROID 06/20/2006    Past Surgical History:  Procedure Laterality Date  . CATARACT EXTRACTION W/ INTRAOCULAR LENS IMPLANT Left 08/03/13   Dr. Katy Fitch @ [facility]  . EYE SURGERY Right    Cataract  . ORIF PATELLA Right 10/29/2019   Procedure: OPEN REDUCTION INTERNAL (ORIF) FIXATION PATELLA;  Surgeon: Meredith Pel, MD;  Location: [facility];  Service: Orthopedics;  Laterality: Right;  . ORIF RADIAL FRACTURE Right 05/02/2020   Procedure: OPEN REDUCTION INTERNAL FIXATION (ORIF) RADIAL FRACTURE;  Surgeon: Cindra Presume, MD;  Location: [facility];  Service: Plastics;  Laterality: Right;  90 min, please  . r hip fracture and repair  January 24, 2010    There were no vitals filed for this visit.          Treatment:   Fluidotherapy x 10 mins ,  for pain and stiffness, with pt performing A/ROM while in fluidotherapy. No adverse reactions.    PROM composite finger flexion /extension, isolated PIP blocking for index finger for flexion/ extension,    A/ROM with 2 lbs weight 15-20 reps, each direction, then ulnar radial deviation,  Wrist flexion/ extension, passive stretch with 3 lbs weight in extension x 5 reps , min v.c  Red  putty exercises for gross grasp, and pinch, 20 reps each   Placing [place]  pegs into pegboard with tweezers for sustained pinch RUE, then removing with finger tips min difficulty/ v.c   Discussed plans for d/c next visit                       OT Short Term Goals - 07/05/20 1432      OT SHORT TERM GOAL #1   Title I with splint wear care and precautions.    Time 4    Period Weeks    Status Achieved      OT SHORT TERM GOAL #2   Title I with inital HEP.    Time 4    Period Weeks    Status Achieved      OT SHORT TERM GOAL #3   Title  I with edema control techhniques    Time 4    Period Weeks    Status Achieved      OT SHORT TERM GOAL #4   Title Pt will increase RUE index finger MP flexion to 75  for increased functional use    Time 4    Period Weeks    Status Achieved   75     OT SHORT TERM GOAL #5   Title Pt will increase RUE index finger PIP flexion to 75* and extension to 10 for increased functional use.    Time 4    Period Weeks    Status On-going   59, -15, met for flexion            OT Long Term Goals - 07/14/20 1134      OT LONG TERM GOAL #1   Title I with updated HEP- 08/10/20    Time 12    Period Weeks    Status On-going      OT LONG TERM GOAL #2   Title Pt will demonstrate at least 90% composite finger flexion for RUE.    Time 12    Period Weeks    Status On-going   approx 80%     OT LONG TERM GOAL #3   Title Pt will demonstrate grip strength of at least 25 lbs in RUE for increased functional use.    Time 12    Period  Weeks    Status On-going      OT LONG TERM GOAL #4   Title Pt will resume use of RUE as dominant hand at least 90% of the time for ADLs/ IADLS with pain less than or equal to 3/10    Time 12    Period Weeks    Status On-going      OT LONG TERM GOAL #5   Title Pt will demonstrate right wrist flexion / extension WFLS for ADLs.    Time 12    Period Weeks    Status On-going      OT LONG TERM GOAL #6   Title Pt will demonstrate forearm supination/ pronation for at least 85* for increased RUE functional use.    Time 12    Period Weeks    Status New                  Patient will benefit from skilled therapeutic intervention in order to improve the following deficits and impairments:           Visit Diagnosis: Stiffness of right wrist, not elsewhere classified  Pain in right wrist  Stiffness of right hand, not elsewhere classified  Localized edema  Muscle weakness (generalized)  Pain in right finger(s)    Problem List Patient Active Problem List   Diagnosis Date Noted  . Closed patellar sleeve fracture, left, initial encounter 11/10/2019  . Closed patellar sleeve fracture of right knee 10/28/2019  . Finger fracture, right 10/28/2019  . Conjunctivitis 11/07/2017  . Hyperlipidemia 01/01/2013  . Allergic rhinitis 12/12/2011  . Breast mass, right 11/23/2010  . WEIGHT GAIN 11/07/2009  . Depression, major, single episode, in partial remission ([facility]) 09/26/2006  . Essential hypertension 09/26/2006    Chamika Cunanan 07/21/2020, 11:21 AM  [facility] [address] [place], [place], [address] Phone: [phone]   Fax:  [phone]  Name: LABRIA WOS MRN: [id number] Date of Birth: 1955/09/14

## 2020-07-26 ENCOUNTER — Ambulatory Visit: Payer: Medicare Other | Admitting: Occupational Therapy

## 2020-07-28 ENCOUNTER — Ambulatory Visit: Payer: Medicare Other | Attending: Internal Medicine | Admitting: Occupational Therapy

## 2020-07-28 ENCOUNTER — Other Ambulatory Visit: Payer: Self-pay

## 2020-07-28 ENCOUNTER — Encounter: Payer: Self-pay | Admitting: Occupational Therapy

## 2020-07-28 DIAGNOSIS — M25631 Stiffness of right wrist, not elsewhere classified: Secondary | ICD-10-CM | POA: Insufficient documentation

## 2020-07-28 DIAGNOSIS — M79644 Pain in right finger(s): Secondary | ICD-10-CM | POA: Insufficient documentation

## 2020-07-28 DIAGNOSIS — M25531 Pain in right wrist: Secondary | ICD-10-CM | POA: Diagnosis present

## 2020-07-28 DIAGNOSIS — R600 Localized edema: Secondary | ICD-10-CM | POA: Diagnosis present

## 2020-07-28 DIAGNOSIS — M6281 Muscle weakness (generalized): Secondary | ICD-10-CM | POA: Diagnosis present

## 2020-07-28 DIAGNOSIS — M25641 Stiffness of right hand, not elsewhere classified: Secondary | ICD-10-CM | POA: Diagnosis present

## 2020-07-28 NOTE — Therapy (Signed)
[facility] [address] [place], [place], [address] Phone: [phone]   Fax:  [phone]  Occupational Therapy Treatment/Discharge   Patient Details  Name: April Castillo MRN: [id number] Date of Birth: 06/30/1955 Referring Provider (OT): Dr. Claudia Desanctis (Dr. Marlou Sa for index finger)   Encounter Date: 07/28/2020  OCCUPATIONAL THERAPY DISCHARGE SUMMARY    Current functional level related to goals / functional outcomes: Pt made excellent progress towards goals. She did not fully achieve goal for wrist extension, however pt reports this is the 3rd time she has broken this wrist and she did not have full ROM prior to current injury.   Remaining deficits: Decreased ROM, decreased strength   Education / Equipment: Pt was instructed in HEP, she demonstrates understanding. Plan: Patient agrees to discharge.  Patient goals were partially met. Patient is being discharged due to meeting the stated rehab goals.  ?????       OT End of Session - 07/28/20 1035    Visit Number 14    Number of Visits 24    Date for OT Re-Evaluation 08/10/20    Authorization Type BCBS Medicare    Authorization - Visit Number 14    Authorization - Number of Visits 20    OT Start Time 1020    OT Stop Time 1100    OT Time Calculation (min) 40 min    Activity Tolerance Patient tolerated treatment well    Behavior During Therapy WFL for tasks assessed/performed           Past Medical History:  Diagnosis Date  . Depression   . Heart murmur   . Hip fracture, right ([facility]) 11/23/2010  . History of kidney stones   . Hypertension   . KNEE PAIN, LEFT 11/07/2009  . Mitral valve prolapse    reported by patient, does not see cardiologist  . Other and unspecified hyperlipidemia 01/01/2013  . PERIMENOPAUSAL SYNDROME 11/07/2009  . Rosacea 06/20/2006  . Tension headache 06/20/2006       . UTERINE FIBROID 06/20/2006    Past Surgical History:  Procedure  Laterality Date  . CATARACT EXTRACTION W/ INTRAOCULAR LENS IMPLANT Left 08/03/13   Dr. Katy Fitch @ [facility]  . EYE SURGERY Right    Cataract  . ORIF PATELLA Right 10/29/2019   Procedure: OPEN REDUCTION INTERNAL (ORIF) FIXATION PATELLA;  Surgeon: Meredith Pel, MD;  Location: [facility];  Service: Orthopedics;  Laterality: Right;  . ORIF RADIAL FRACTURE Right 05/02/2020   Procedure: OPEN REDUCTION INTERNAL FIXATION (ORIF) RADIAL FRACTURE;  Surgeon: Cindra Presume, MD;  Location: [facility];  Service: Plastics;  Laterality: Right;  90 min, please  . r hip fracture and repair  January 24, 2010    There were no vitals filed for this visit.   Subjective Assessment - 07/28/20 1508    Subjective  Pt agrees with plans for d/c    Pertinent History Pt is a 65 y.o female with a diagnosis of comminuted intra-articular right distal radius fx, s/p ORIF by Dr. Claudia Desanctis on 05/02/20. Pt also has a dignosis of closed nondisplaced fracture of middle phalanx of right index finger 10/27/19 for which she has not yet recieved OT.    Limitations 06/02/20 cleared for gentle A/ROM to wrist, 06/16/20 cleared for d/c splint and strengthening    Currently in Pain? No/denies                Treatment:  Fluidotherapy x 9 mins ,  for pain and stiffness, with pt performing A/ROM while in fluidotherapy. No adverse reactions.    PROM composite finger flexion /extension, isolated PIP blocking for index finger for flexion/ extension, P/ROM to IP in flexion/ extension   A/ROM with 3 lbs weight 15-20 reps, each direction, then ulnar radial deviation,  Upgraded  putty exercises for gross grasp, and pinch, to green 10- 20 reps each  Checked progress towards goals, pt agrees with plans for d/c                      OT Short Term Goals - 07/28/20 1053      OT SHORT TERM GOAL #1   Title I with splint wear care and precautions.    Time 4    Period Weeks    Status Achieved      OT SHORT TERM GOAL #2    Title I with inital HEP.    Time 4    Period Weeks    Status Achieved      OT SHORT TERM GOAL #3   Title I with edema control techhniques    Time 4    Period Weeks    Status Achieved      OT SHORT TERM GOAL #4   Title Pt will increase RUE index finger MP flexion to 75  for increased functional use    Time 4    Period Weeks    Status Achieved   75     OT SHORT TERM GOAL #5   Title Pt will increase RUE index finger PIP flexion to 75* and extension to 10 for increased functional use.    Time 4    Period Weeks    Status Achieved   75, -10, met for flexion            OT Long Term Goals - 07/28/20 1048      OT LONG TERM GOAL #1   Title I with updated HEP- 08/10/20    Time 12    Period Weeks    Status Achieved      OT LONG TERM GOAL #2   Title Pt will demonstrate at least 90% composite finger flexion for RUE.    Time 12    Period Weeks    Status Achieved   90%     OT LONG TERM GOAL #3   Title Pt will demonstrate grip strength of at least 25 lbs in RUE for increased functional use.    Time 12    Period Weeks    Status Achieved   26.2     OT LONG TERM GOAL #4   Title Pt will resume use of RUE as dominant hand at least 90% of the time for ADLs/ IADLS with pain less than or equal to 3/10    Time 12    Period Weeks    Status Achieved      OT LONG TERM GOAL #5   Title Pt will demonstrate right wrist flexion / extension WFLS for ADLs.    Time 12    Period Weeks    Status Partially Met   wrist flexion/ extension: 50/60Pt reports that she had previous limitations in wirst flexion/ extension as this is her 3rd break     OT LONG TERM GOAL #6   Title Pt will demonstrate forearm supination/ pronation for at least 85* for increased RUE functional use.    Time 12    Period Weeks  Status Achieved   90/90                Plan - 07/28/20 1250    Clinical Impression Statement Pt demonstates good overall progress. she agrees with plans for d/c.    OT Occupational  Profile and History Problem Focused Assessment - Including review of records relating to presenting problem    Occupational performance deficits (Please refer to evaluation for details): ADL's;IADL's;Rest and Sleep;Work;Leisure;Social Participation    Body Structure / Function / Physical Skills ADL;UE functional use;Pain;Flexibility;FMC;ROM;GMC;Coordination;Decreased knowledge of use of DME;IADL;Decreased knowledge of precautions;Skin integrity;Strength;Dexterity;Edema    Rehab Potential Good    Clinical Decision Making Limited treatment options, no task modification necessary    Comorbidities Affecting Occupational Performance: May have comorbidities impacting occupational performance    Modification or Assistance to Complete Evaluation  No modification of tasks or assist necessary to complete eval    OT Frequency 2x / week    OT Duration 12 weeks    OT Treatment/Interventions Self-care/ADL training;Ultrasound;Patient/family education;DME and/or AE instruction;Paraffin;Passive range of motion;Fluidtherapy;Cryotherapy;Electrical Stimulation;Contrast Bath;Splinting;Therapeutic activities;Manual Therapy;Therapeutic exercise;Moist Heat;Neuromuscular education    Plan d/c OT    Consulted and Agree with Plan of Care Patient           Patient will benefit from skilled therapeutic intervention in order to improve the following deficits and impairments:   Body Structure / Function / Physical Skills: ADL,UE functional use,Pain,Flexibility,FMC,ROM,GMC,Coordination,Decreased knowledge of use of DME,IADL,Decreased knowledge of precautions,Skin integrity,Strength,Dexterity,Edema       Visit Diagnosis: Stiffness of right wrist, not elsewhere classified  Pain in right wrist  Stiffness of right hand, not elsewhere classified  Localized edema  Muscle weakness (generalized)  Pain in right finger(s)    Problem List Patient Active Problem List   Diagnosis Date Noted  . Closed patellar sleeve  fracture, left, initial encounter 11/10/2019  . Closed patellar sleeve fracture of right knee 10/28/2019  . Finger fracture, right 10/28/2019  . Conjunctivitis 11/07/2017  . Hyperlipidemia 01/01/2013  . Allergic rhinitis 12/12/2011  . Breast mass, right 11/23/2010  . WEIGHT GAIN 11/07/2009  . Depression, major, single episode, in partial remission ([facility]) 09/26/2006  . Essential hypertension 09/26/2006    April Castillo 07/28/2020, 3:09 PM April Castillo, OTR/L [phone] Phone: [phone] 3:15 PM 07/28/20 [facility] [address] [place], [place], [address] Phone: [phone]   Fax:  [phone]  Name: April Castillo MRN: [id number] Date of Birth: [birth year]

## 2021-03-04 ENCOUNTER — Encounter (HOSPITAL_BASED_OUTPATIENT_CLINIC_OR_DEPARTMENT_OTHER): Payer: Self-pay | Admitting: Emergency Medicine

## 2021-03-04 ENCOUNTER — Emergency Department (HOSPITAL_BASED_OUTPATIENT_CLINIC_OR_DEPARTMENT_OTHER)
Admission: EM | Admit: 2021-03-04 | Discharge: 2021-03-04 | Disposition: A | Discharge status: Home / Self Care | Payer: Medicare Other | Attending: Emergency Medicine | Admitting: Emergency Medicine

## 2021-03-04 ENCOUNTER — Emergency Department (HOSPITAL_BASED_OUTPATIENT_CLINIC_OR_DEPARTMENT_OTHER): Payer: Medicare Other | Admitting: Radiology

## 2021-03-04 ENCOUNTER — Other Ambulatory Visit: Payer: Self-pay

## 2021-03-04 DIAGNOSIS — Z7982 Long term (current) use of aspirin: Secondary | ICD-10-CM | POA: Insufficient documentation

## 2021-03-04 DIAGNOSIS — W01198A Fall on same level from slipping, tripping and stumbling with subsequent striking against other object, initial encounter: Secondary | ICD-10-CM | POA: Insufficient documentation

## 2021-03-04 DIAGNOSIS — Z79899 Other long term (current) drug therapy: Secondary | ICD-10-CM | POA: Insufficient documentation

## 2021-03-04 DIAGNOSIS — I10 Essential (primary) hypertension: Secondary | ICD-10-CM | POA: Insufficient documentation

## 2021-03-04 DIAGNOSIS — S92424A Nondisplaced fracture of distal phalanx of right great toe, initial encounter for closed fracture: Secondary | ICD-10-CM | POA: Diagnosis not present

## 2021-03-04 DIAGNOSIS — S99921A Unspecified injury of right foot, initial encounter: Secondary | ICD-10-CM | POA: Diagnosis present

## 2021-03-04 MED ORDER — LIDOCAINE HCL 2 % IJ SOLN
10.0000 mL | Freq: Once | INTRAMUSCULAR | Status: AC
  Administered 2021-03-04: 200 mg
  Filled 2021-03-04: qty 20

## 2021-03-04 NOTE — ED Triage Notes (Addendum)
Pt presents to ED Pov. Pt c/o R great toe pain. Pt reports mechanical fall. Pt reports that toe nail got ripped off. Pt denies head injury, no LOC, no blood thinners.

## 2021-03-04 NOTE — Discharge Instructions (Signed)
Return if any problems.  See your Physician for recheck in 1 week   °

## 2021-03-05 NOTE — ED Provider Notes (Signed)
[facility] EMERGENCY DEPT Provider Note   CSN: [id number] Arrival date & time: 03/04/21  1131     History Chief Complaint  Patient presents with   Toe Pain    April Castillo is a 65 y.o. female.  Pt reports she hit her toe and nail has pulled off.  Pt has pain in 1st and 2nd toe.    The history is provided by the patient. No language interpreter was used.  Toe Pain This is a new problem. The current episode started less than 1 hour ago. The problem occurs constantly. The problem has not changed since onset.Nothing relieves the symptoms. She has tried nothing for the symptoms. The treatment provided no relief.      Past Medical History:  Diagnosis Date   Depression    Heart murmur    Hip fracture, right ([place]) 11/23/2010   History of kidney stones    Hypertension    KNEE PAIN, LEFT 11/07/2009   Mitral valve prolapse    reported by patient, does not see cardiologist   Other and unspecified hyperlipidemia 01/01/2013   PERIMENOPAUSAL SYNDROME 11/07/2009   Rosacea 06/20/2006   Tension headache 06/20/2006        UTERINE FIBROID 06/20/2006    Patient Active Problem List   Diagnosis Date Noted   Closed patellar sleeve fracture, left, initial encounter 11/10/2019   Closed patellar sleeve fracture of right knee 10/28/2019   Finger fracture, right 10/28/2019   Conjunctivitis 11/07/2017   Hyperlipidemia 01/01/2013   Allergic rhinitis 12/12/2011   Breast mass, right 11/23/2010   WEIGHT GAIN 11/07/2009   Depression, major, single episode, in partial remission ([place]) 09/26/2006   Essential hypertension 09/26/2006    Past Surgical History:  Procedure Laterality Date   CATARACT EXTRACTION W/ INTRAOCULAR LENS IMPLANT Left 08/03/13   Dr. Katy Fitch @ [facility] Right    Cataract   ORIF PATELLA Right 10/29/2019   Procedure: OPEN REDUCTION INTERNAL (ORIF) FIXATION PATELLA;  Surgeon: Meredith Pel, MD;  Location: [facility];  Service: Orthopedics;  Laterality:  Right;   ORIF RADIAL FRACTURE Right 05/02/2020   Procedure: OPEN REDUCTION INTERNAL FIXATION (ORIF) RADIAL FRACTURE;  Surgeon: Cindra Presume, MD;  Location: [facility];  Service: Plastics;  Laterality: Right;  90 min, please   r hip fracture and repair  January 24, 2010     OB History   No obstetric history on file.     Family History  Problem Relation Age of Onset   Cancer Father    Hypertension Father    Cancer Sister    Thyroid disease Sister    Breast cancer Sister    Cancer Maternal Uncle    Diabetes Neg Hx    Heart disease Neg Hx     Social History   Tobacco Use   Smoking status: Never   Smokeless tobacco: Never  Substance Use Topics   Alcohol use: Yes    Alcohol/week: 3.0 standard drinks    Types: 1 Glasses of wine, 2 Cans of beer per week    Comment: few drinks a week   Drug use: No    Home Medications Prior to Admission medications   Medication Sig Start Date End Date Taking? Authorizing Provider  alendronate (FOSAMAX) 70 MG tablet Take 70 mg by mouth once a week. Take with a full glass of water on an empty stomach.    [provider]  aspirin EC 81 MG tablet Take  81 mg by mouth daily. Swallow whole.    [provider]  FLUoxetine (PROZAC) 20 MG capsule Take 1 capsule (20 mg total) by mouth daily. 01/15/18   Sherene Sires, DO  HYDROcodone-acetaminophen (NORCO) 5-325 MG tablet Take 1-2 tablets by mouth every 6 (six) hours as needed for severe pain. 04/25/20   Elnora Morrison, MD  lisinopril (PRINIVIL,ZESTRIL) 10 MG tablet TAKE 1 TABLET DAILY 10/03/17   Nicolette Bang, MD  Multiple Vitamin (MULTIVITAMIN) capsule Take 1 capsule by mouth daily.    [provider]  NON FORMULARY DIET: REGULAR, NAS, HEART HEALTHY 11/06/19   [provider]  rosuvastatin (CRESTOR) 20 MG tablet Take 20 mg by mouth daily.    [provider]    Allergies    Patient has no known allergies.  Review of Systems   Review of  Systems  All other systems reviewed and are negative.  Physical Exam Updated Vital Signs BP 139/72 (BP Location: Left Arm)   Pulse 75   Temp 98.3 F (36.8 C) (Oral)   Resp 18   Ht 5\' 6"  (1.676 m)   Wt 65.8 kg   SpO2 95%   BMI 23.40 kg/m   Physical Exam Vitals reviewed.  HENT:     Head: Normocephalic.     Mouth/Throat:     Mouth: Mucous membranes are moist.  Cardiovascular:     Rate and Rhythm: Normal rate.  Pulmonary:     Effort: Pulmonary effort is normal.  Musculoskeletal:        General: Swelling and tenderness present.     Comments: Bruised 1st and 2nd toe right foot,  Toenail right 1st toe, almost completely avulsed   Skin:    General: Skin is warm.  Neurological:     General: No focal deficit present.     Mental Status: She is alert.  Psychiatric:        Mood and Affect: Mood normal.    ED Results / Procedures / Treatments   Labs (all labs ordered are listed, but only abnormal results are displayed) Labs Reviewed - No data to display  EKG None  Radiology DG Foot Complete Right  Result Date: 03/04/2021 CLINICAL DATA:  Status post fall with right first toenail being ripped off. EXAM: RIGHT FOOT COMPLETE - 3+ VIEW COMPARISON:  None. FINDINGS: There is minimal displaced intra-articular fracture at the proximal aspect of the right first distal phalanx. The first toenail is displaced. There is no dislocation. IMPRESSION: Minimal displaced intra-articular fracture at the proximal aspect of the right first distal phalanx. The first toenail is displaced. Electronically Signed   By: Abelardo Diesel M.D.   On: 03/04/2021 14:31    Procedures .Nail Removal  Date/Time: 03/05/2021 12:35 PM Performed by: Fransico Meadow, PA-C Authorized by: Fransico Meadow, PA-C   Consent:    Consent obtained:  Verbal   Consent given by:  Patient   Risks, benefits, and alternatives were discussed: yes     Risks discussed:  Bleeding   Alternatives discussed:  No treatment Universal  protocol:    Procedure explained and questions answered to patient or proxy's satisfaction: yes     Immediately prior to procedure, a time out was called: yes     Patient identity confirmed:  Verbally with patient Location:    Foot:  R big toe Pre-procedure details:    Skin preparation:  Chlorhexidine Anesthesia:    Anesthesia method:  Nerve block   Block location:  Toe  Block needle gauge:  27 G   Block anesthetic:  Lidocaine 1% w/o epi   Block technique:  Digital Nail Removal:    Nail removed:  Complete   Nail bed repaired: no   Post-procedure details:    Procedure completion:  Tolerated   Medications Ordered in ED Medications  lidocaine (XYLOCAINE) 2 % (with pres) injection 200 mg (200 mg Infiltration Given 03/04/21 1355)    ED Course  I have reviewed the triage vital signs and the nursing notes.  Pertinent labs & imaging results that were available during my care of the patient were reviewed by me and considered in my medical decision making (see chart for details).    MDM Rules/Calculators/A&P                           MDM: Pt offered option of reimplanting nail.  Pt perfers to have removed.  Xray foot shows a distal phalanx fracture.  Pt counseled on fracture  Final Clinical Impression(s) / ED Diagnoses Final diagnoses:  Closed nondisplaced fracture of distal phalanx of right great toe, initial encounter    Rx / DC Orders ED Discharge Orders     None     An After Visit Summary was printed and given to the patient.    Fransico Meadow, [place] 03/05/21 1237    Pattricia Boss, MD 03/08/21 1005

## 2022-01-16 IMAGING — CT CT KNEE*L* W/O CM
3 series · 12 of 33 positions shown, 14 images · non-contrast
Comparison: Radiograph 10/27/2019

CLINICAL DATA: Known right knee fracture, query initially
radiographically occult injury of the left knee.

EXAM:
CT OF THE left KNEE WITHOUT CONTRAST
TECHNIQUE: Multidetector CT imaging of the left knee was performed according to
the standard protocol. Multiplanar CT image reconstructions were
also generated.

[Series 4: extremity soft tissue · axial · 0.45mm/px · z∈[+622,+826]mm · 4 of 148 slices shown, 5 images]
[im 23/148  soft-tissue]
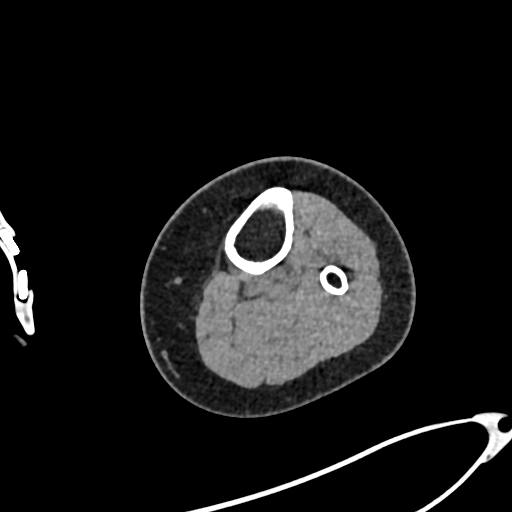
[im 23/148  bone]
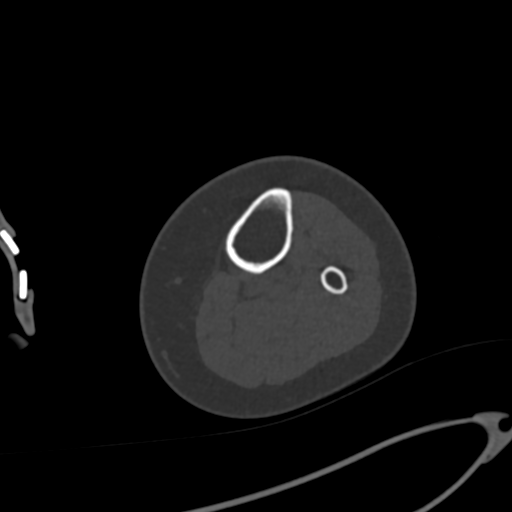
[im 57/148  bone]
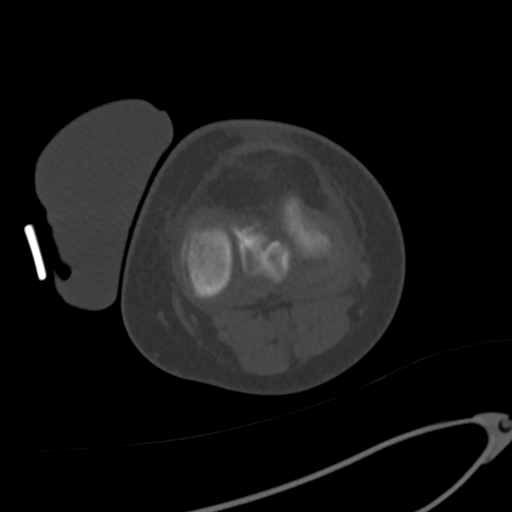
[im 91/148  bone]
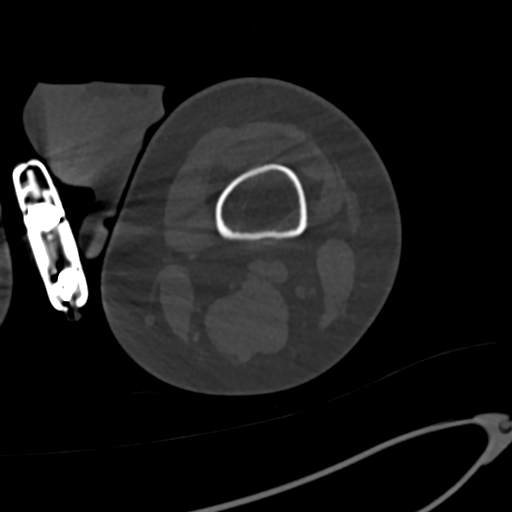
[im 125/148  bone]
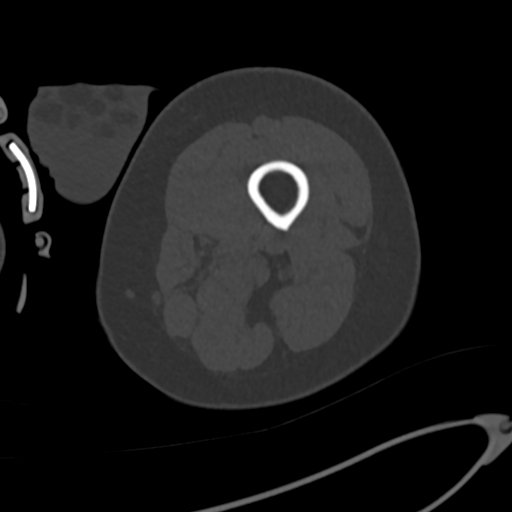

[Series 8: cor soft tissue · coronal · 0.35mm/px · 3 of 89 slices shown]
[im 18/89  bone]
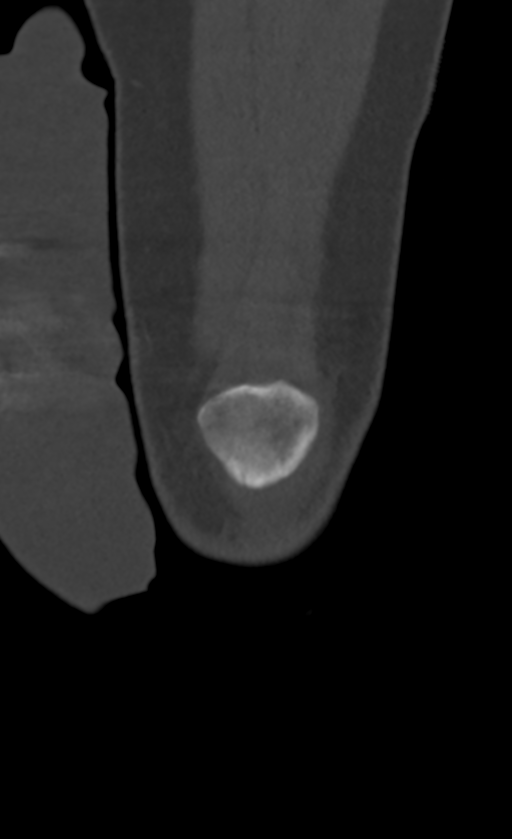
[im 36/89  bone]
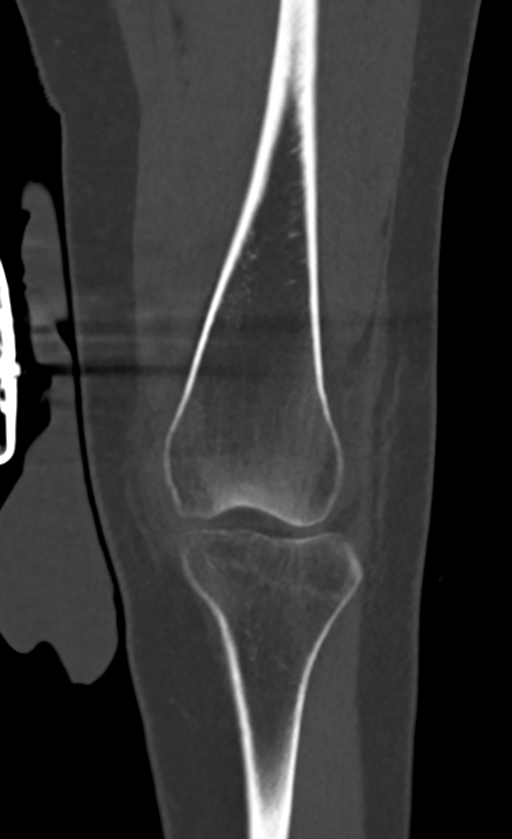
[im 53/89  bone]
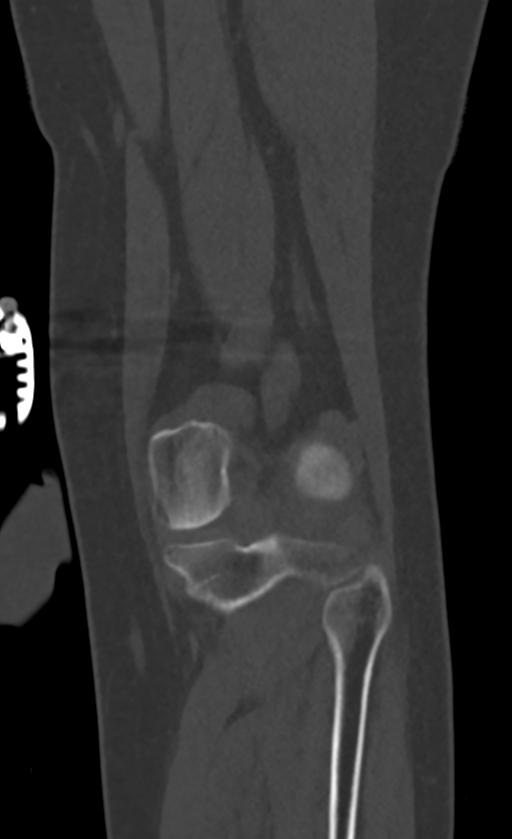

[Series 9: sag soft tissue · sagittal · 0.37mm/px · 5 of 80 slices shown, 6 images]
[im 27/80  bone]
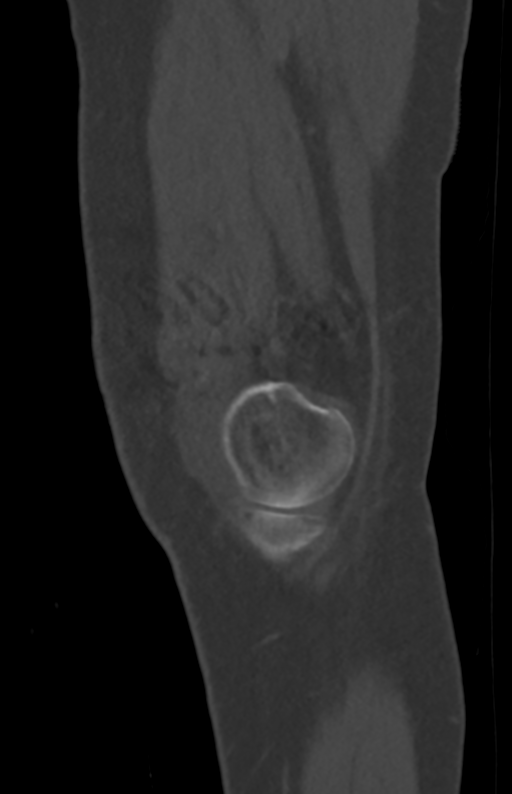
[im 33/80  bone]
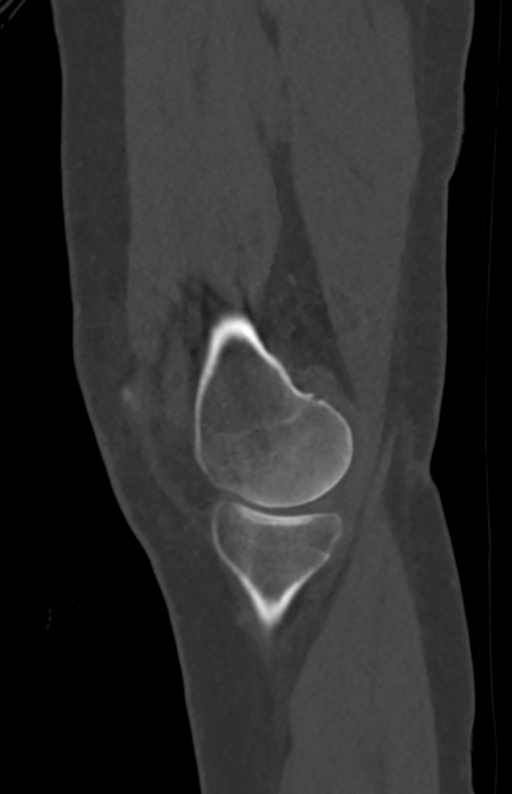
[im 40/80  soft-tissue]
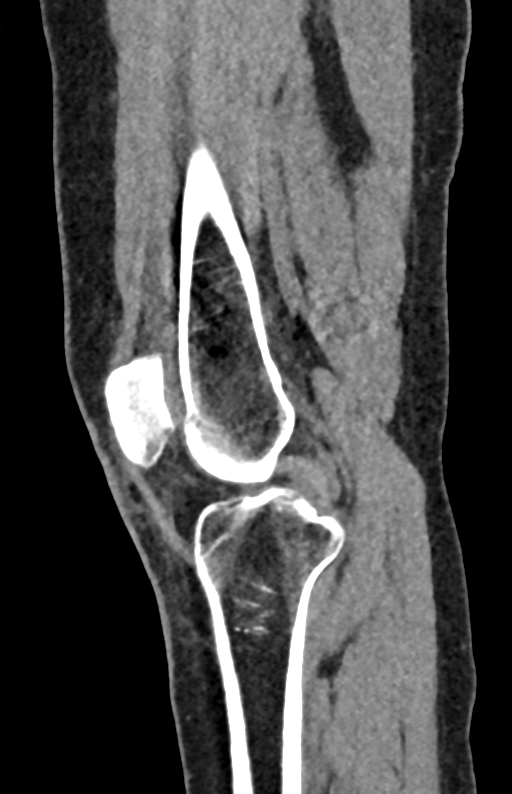
[im 40/80  bone]
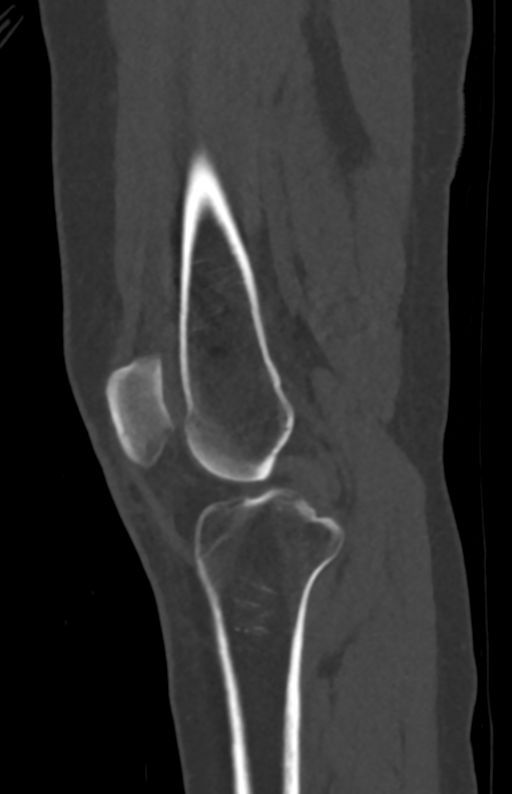
[im 47/80  bone]
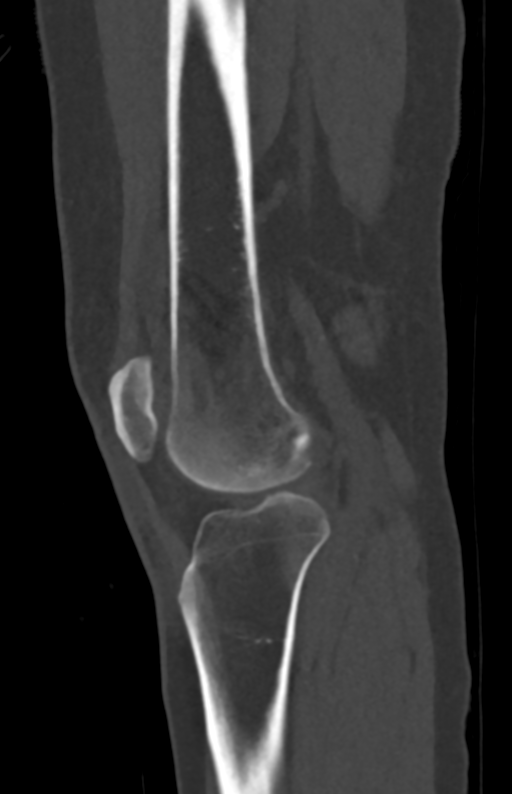
[im 53/80  bone]
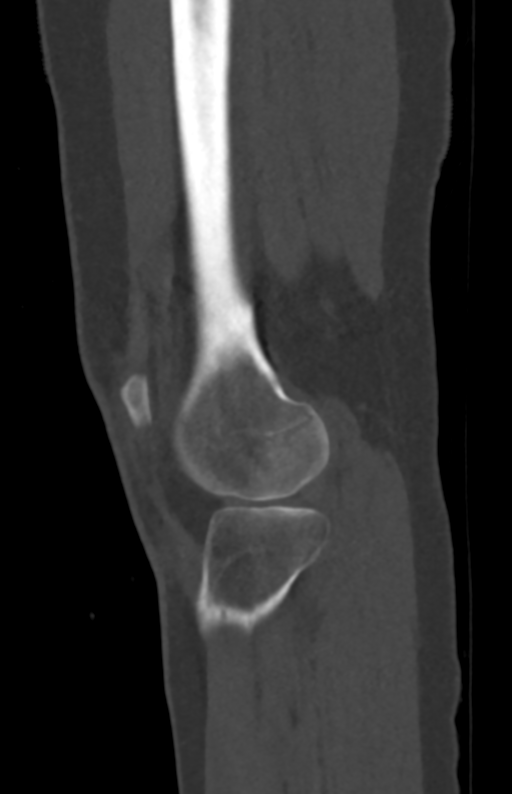

[12 of 33 positions shown; findings below may reference images not displayed]

FINDINGS: Bones/Joint/Cartilage

Subtle linear nondisplaced fractures of the left knee. One of these
extends obliquely along the inferomedial pole of the left patella as
shown on image 67/6. There also appears to be a subtle linear
vertically oriented fracture plane at the junction of the middle and
lateral patellar thirds as shown on image 72/6.

There is considerable degenerative articular cartilage thinning in
the medial compartment and moderate degenerative chondral thinning
in the lateral compartment causing joint space narrowing. No other
fractures around the knee are identified.

Small to moderate-sized hemarthrosis.

Ligaments

Suboptimally assessed by CT. Structures with expected contour for
the ACL and PCL are observed.

Muscles and Tendons

Unremarkable

Soft tissues

Subcutaneous edema anterior to the patellar tendon and along the
patellar retinacula, lateral greater than medial.
IMPRESSION: 1. Subtle linear nondisplaced fractures of the left patella. One of
these extends obliquely along the inferomedial pole of the left
patella. There also appears to be a subtle linear vertically
oriented fracture plane at the junction of the middle and lateral
patellar thirds.
2. Small to moderate-sized hemarthrosis.
3. Subcutaneous edema anterior to the patellar tendon and along the
patellar retinacula, lateral greater than medial.
4. Considerable degenerative articular cartilage thinning in the
medial compartment and moderate degenerative chondral thinning in
the lateral compartment.

## 2022-07-13 IMAGING — CR DG WRIST COMPLETE 3+V*R*
4 series · 4 of 4 positions shown · non-contrast
Comparison: None.

CLINICAL DATA: Right wrist deformity after fall today.

EXAM:
RIGHT WRIST - COMPLETE 3+ VIEW

[wrist pa]
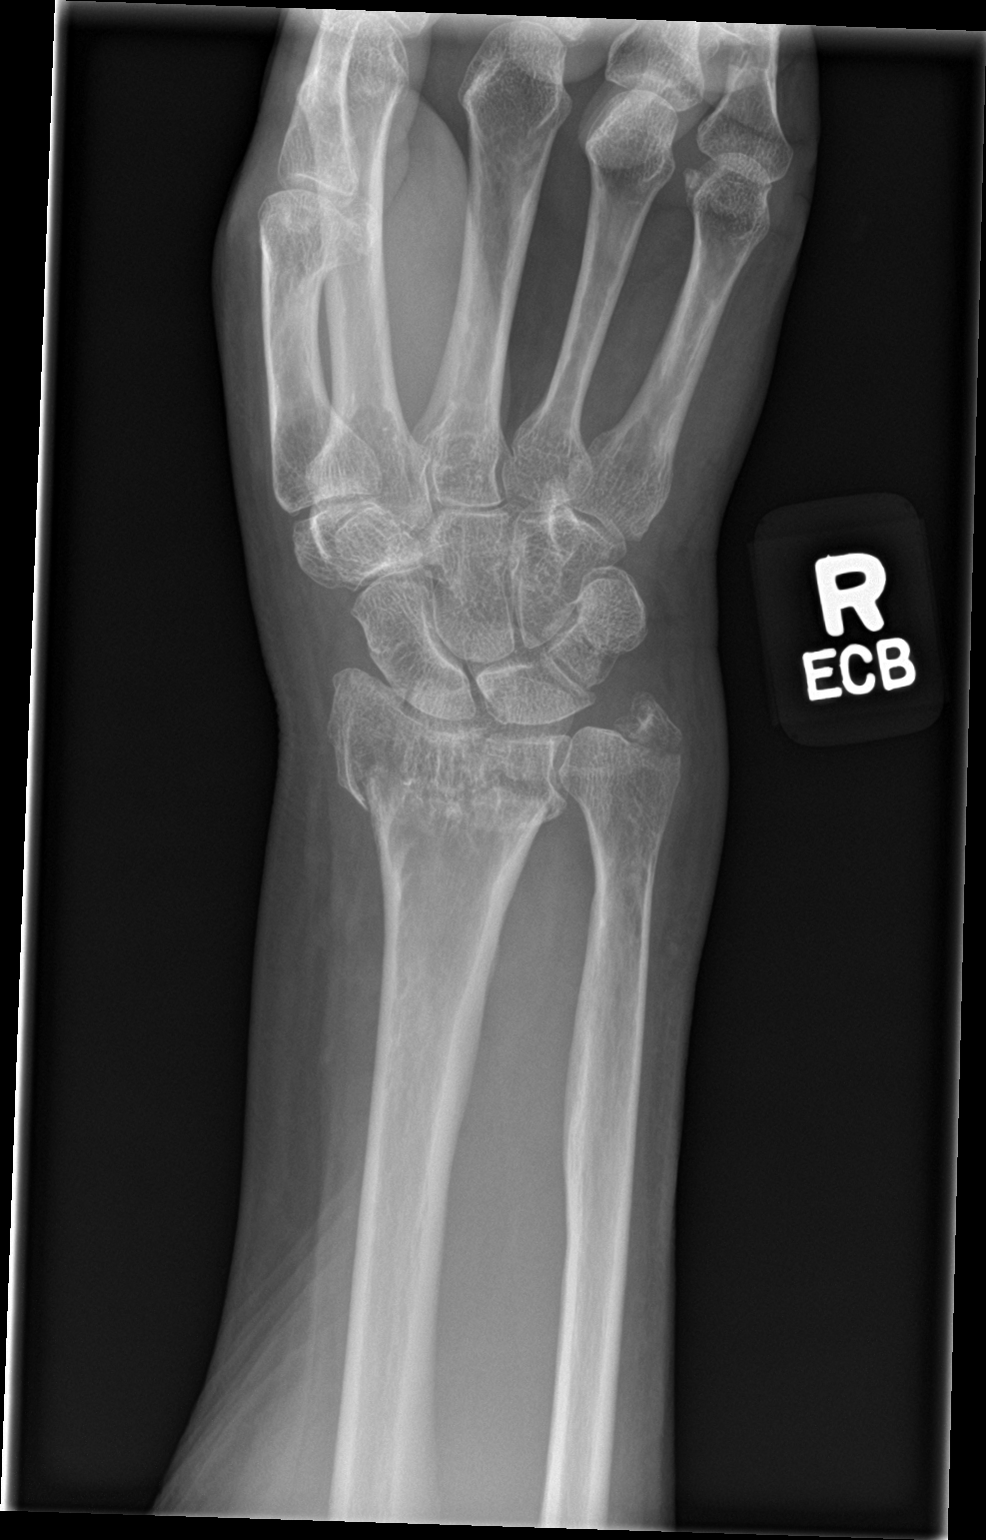

[wrist obl]
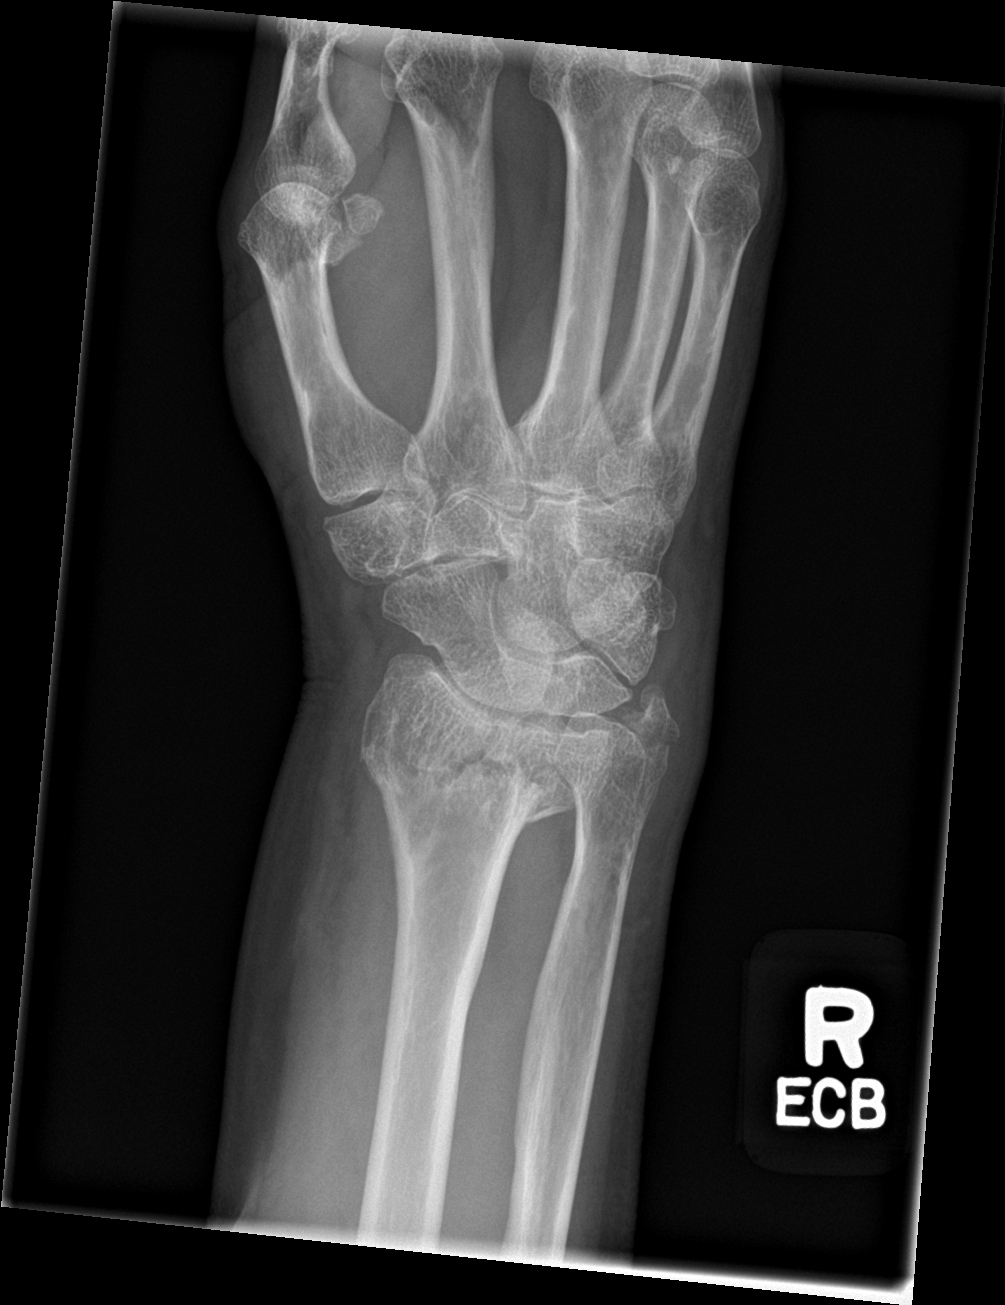

[wrist lat]
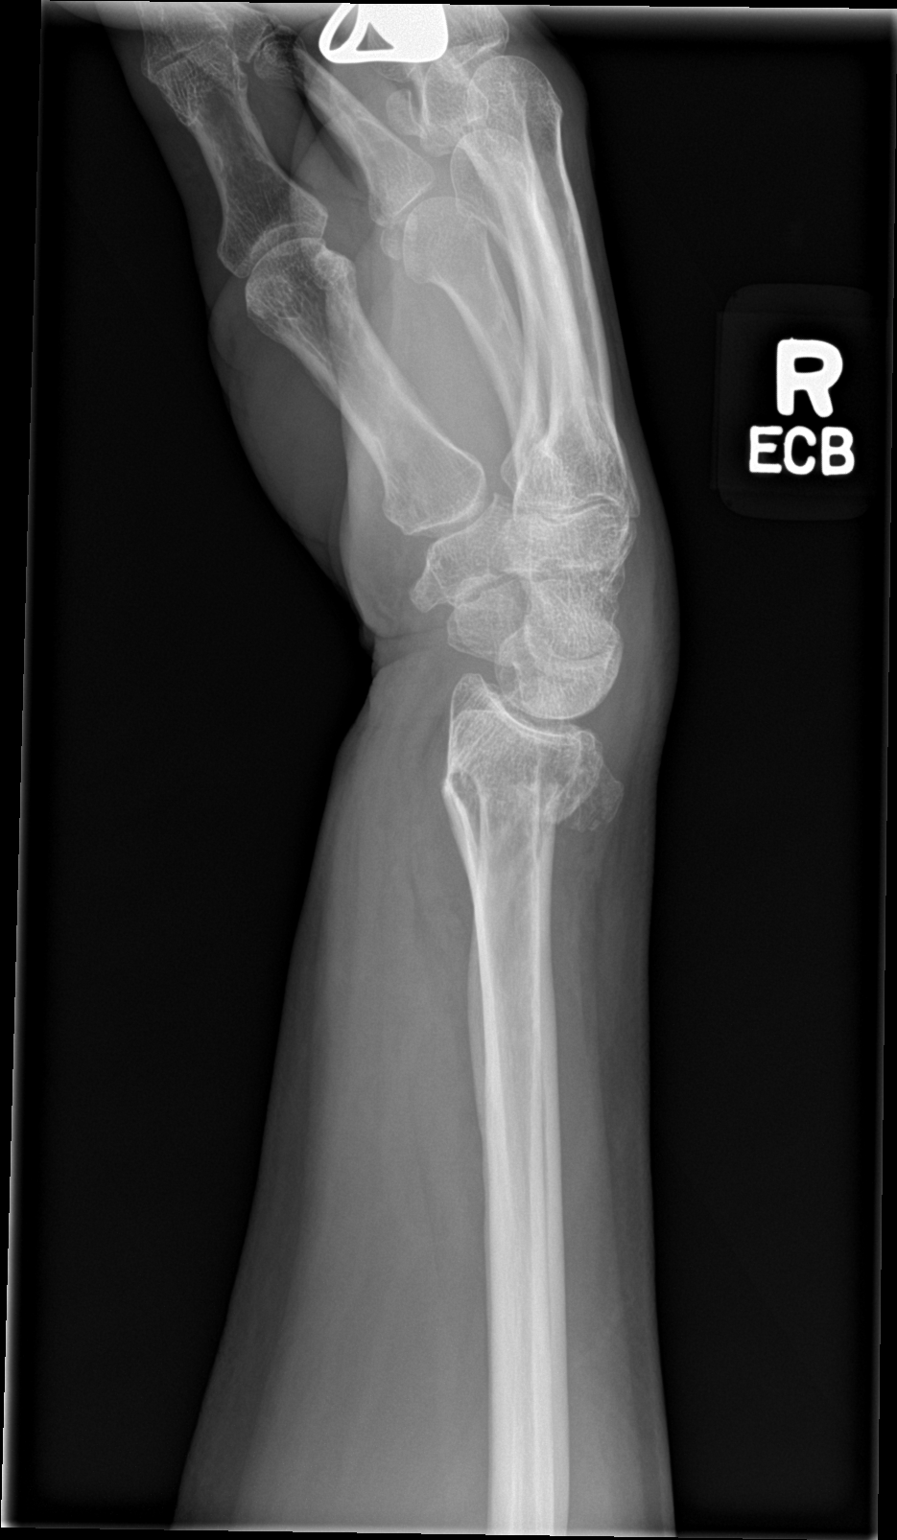

[wrist navicular]
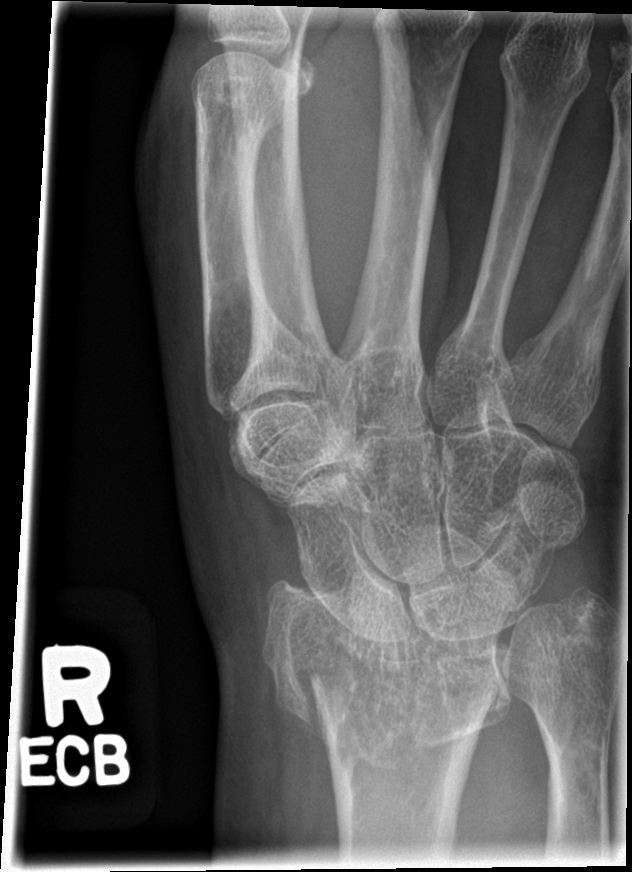

[4 of 4 positions shown; findings below may reference images not displayed]

FINDINGS: Comminuted, mildly angulated, mildly impacted fracture of the distal
right radius extending to the radiocarpal joint.

Minimally displaced fracture of the ulnar styloid process.
IMPRESSION: 1. Comminuted, mildly angulated, mildly impacted fracture of the
distal radius extending to the radiocarpal joint.
2. Minimally displaced fracture of the ulnar styloid process.

## 2024-03-07 DIAGNOSIS — N132 Hydronephrosis with renal and ureteral calculous obstruction: Secondary | ICD-10-CM | POA: Diagnosis not present

## 2024-03-07 DIAGNOSIS — R10A2 Flank pain, left side: Secondary | ICD-10-CM | POA: Diagnosis present

## 2024-03-08 ENCOUNTER — Other Ambulatory Visit: Payer: Self-pay

## 2024-03-08 ENCOUNTER — Encounter (HOSPITAL_COMMUNITY): Payer: Self-pay

## 2024-03-08 ENCOUNTER — Emergency Department (HOSPITAL_COMMUNITY)
Admission: EM | Admit: 2024-03-08 | Discharge: 2024-03-08 | Disposition: A | Discharge status: Home / Self Care | Attending: Emergency Medicine | Admitting: Emergency Medicine

## 2024-03-08 ENCOUNTER — Emergency Department (HOSPITAL_COMMUNITY)

## 2024-03-08 DIAGNOSIS — N200 Calculus of kidney: Secondary | ICD-10-CM

## 2024-03-08 LAB — COMPREHENSIVE METABOLIC PANEL WITH GFR
ALT: 32 U/L (ref 0–44)
AST: 32 U/L (ref 15–41)
Albumin: 4.7 g/dL (ref 3.5–5.0)
Alkaline Phosphatase: 60 U/L (ref 38–126)
Anion gap: 11 (ref 5–15)
BUN: 16 mg/dL (ref 8–23)
CO2: 29 mmol/L (ref 22–32)
Calcium: 10.3 mg/dL (ref 8.9–10.3)
Chloride: 106 mmol/L (ref 98–111)
Creatinine, Ser: 0.85 mg/dL (ref 0.44–1.00)
GFR, Estimated: >60 mL/min (ref >60)
Glucose, Bld: 98 mg/dL (ref 70–99)
Potassium: 4 mmol/L (ref 3.5–5.1)
Sodium: 146 mmol/L — ABNORMAL HIGH (ref 135–145)
Total Bilirubin: 0.7 mg/dL (ref 0.0–1.2)
Total Protein: 7.6 g/dL (ref 6.5–8.1)

## 2024-03-08 LAB — URINALYSIS, ROUTINE W REFLEX MICROSCOPIC
Bilirubin Urine: NEGATIVE
Glucose, UA: NEGATIVE mg/dL
Ketones, ur: NEGATIVE mg/dL
Leukocytes,Ua: NEGATIVE
Nitrite: NEGATIVE
Protein, ur: 30 mg/dL — AB
Specific Gravity, Urine: 1.011 (ref 1.005–1.030)
pH: 9 — ABNORMAL HIGH (ref 5.0–8.0)

## 2024-03-08 LAB — LIPASE, BLOOD: Lipase: 28 U/L (ref 11–51)

## 2024-03-08 LAB — CBC
HCT: 38.6 % (ref 36.0–46.0)
Hemoglobin: 12.7 g/dL (ref 12.0–15.0)
MCH: 32 pg (ref 26.0–34.0)
MCHC: 32.9 g/dL (ref 30.0–36.0)
MCV: 97.2 fL (ref 80.0–100.0)
Platelets: 256 K/uL (ref 150–400)
RBC: 3.97 MIL/uL (ref 3.87–5.11)
RDW: 12.6 % (ref 11.5–15.5)
WBC: 6.4 K/uL (ref 4.0–10.5)
nRBC: 0 % (ref 0.0–0.2)

## 2024-03-08 MED ORDER — ONDANSETRON 4 MG PO TBDP: 4.0000 mg | ORAL_TABLET | Freq: Three times a day (TID) | ORAL | 0 refills | Status: DC | PRN

## 2024-03-08 MED ORDER — HYDROMORPHONE HCL 1 MG/ML IJ SOLN
1.0000 mg | Freq: Once | INTRAMUSCULAR | Status: AC
  Administered 2024-03-08: 1 mg via INTRAVENOUS
  Filled 2024-03-08: qty 1

## 2024-03-08 MED ORDER — OXYCODONE-ACETAMINOPHEN 5-325 MG PO TABS: 1.0000 | ORAL_TABLET | Freq: Four times a day (QID) | ORAL | 0 refills | Status: DC | PRN

## 2024-03-08 MED ORDER — KETOROLAC TROMETHAMINE 30 MG/ML IJ SOLN
15.0000 mg | Freq: Once | INTRAMUSCULAR | Status: AC
  Administered 2024-03-08: 15 mg via INTRAVENOUS
  Filled 2024-03-08: qty 1

## 2024-03-08 MED ORDER — TAMSULOSIN HCL 0.4 MG PO CAPS: 0.4000 mg | ORAL_CAPSULE | Freq: Two times a day (BID) | ORAL | 0 refills | Status: DC

## 2024-03-08 MED ORDER — ONDANSETRON 4 MG PO TBDP
4.0000 mg | ORAL_TABLET | Freq: Once | ORAL | Status: AC | PRN
  Administered 2024-03-08: 4 mg via ORAL
  Filled 2024-03-08: qty 1

## 2024-03-08 MED ORDER — TAMSULOSIN HCL 0.4 MG PO CAPS: 0.4000 mg | ORAL_CAPSULE | Freq: Two times a day (BID) | ORAL | 0 refills | Status: AC

## 2024-03-08 NOTE — ED Triage Notes (Signed)
 Pt reports left flank pain onset tonight around 2330 associated with nausea and a feeling like she was going to have diarrhea. No vomiting or actual diarrhea at this time. She thinks this may be a kidney stone. Denies dysuria or hematuria.

## 2024-03-08 NOTE — ED Provider Notes (Addendum)
 [facility] Emergency Department Provider Note MRN:  [id number]  Arrival date & time: 03/08/24     Chief Complaint   Flank Pain   History of Present Illness   April Castillo is a 68 y.o. year-old female presents to the ED with chief complaint of sudden onset left flank pain that started tonight.  She reports associated nausea, but denies vomiting.  She states that her pain is severe.  She denies fevers.  Denies any injury.  Reports remote history of kidney stone.SABRA  History provided by patient.   Review of Systems  Pertinent positive and negative review of systems noted in HPI.    Physical Exam   Vitals:   03/08/24 0103 03/08/24 0305  BP: (!) 185/87 (!) 159/75  Pulse: 81 80  Resp:  12  Temp: 97.7 F (36.5 C)   SpO2: 99% 93%    CONSTITUTIONAL:  uncomfortable-appearing, NAD NEURO:  Alert and oriented x 3, CN 3-12 grossly intact EYES:  eyes equal and reactive ENT/NECK:  Supple, no stridor  CARDIO:  normal rate, regular rhythm, appears well-perfused  PULM:  No respiratory distress, CTAB GI/GU:  non-distended, mild left sided abdominal tenderness MSK/SPINE:  No gross deformities, no edema, moves all extremities  SKIN:  no rash, atraumatic   *Additional and/or pertinent findings included in MDM below  Diagnostic and Interventional Summary    EKG Interpretation Date/Time:    Ventricular Rate:    PR Interval:    QRS Duration:    QT Interval:    QTC Calculation:   R Axis:      Text Interpretation:         Labs Reviewed  COMPREHENSIVE METABOLIC PANEL WITH GFR - Abnormal; Notable for the following components:      Result Value   Sodium 146 (*)    All other components within normal limits  URINALYSIS, ROUTINE W REFLEX MICROSCOPIC - Abnormal; Notable for the following components:   APPearance CLOUDY (*)    pH 9.0 (*)    Hgb urine dipstick SMALL (*)    Protein, ur 30 (*)    Bacteria, UA RARE (*)    All other components within normal  limits  LIPASE, BLOOD  CBC    CT Renal Stone Study  Final Result      Medications  ondansetron  (ZOFRAN -ODT) disintegrating tablet 4 mg (4 mg Oral Given 03/08/24 0014)  HYDROmorphone  (DILAUDID ) injection 1 mg (1 mg Intravenous Given 03/08/24 0131)  ketorolac (TORADOL) 30 MG/ML injection 15 mg (15 mg Intravenous Given 03/08/24 0302)     Procedures  /  Critical Care Procedures  ED Course and Medical Decision Making  I have reviewed the triage vital signs, the nursing notes, and pertinent available records from the EMR.  Social Determinants Affecting Complexity of Care: Patient has no clinically significant social determinants affecting this chief complaint..   ED Course:    Medical Decision Making Patient here with sudden onset left flank pain that started tonight.  She describes the pain as severe.  She has had some nausea and an episode of vomiting.  Symptoms and physical exam are worrisome for kidney stone.  Will check CT renal.  Will treat pain with Dilaudid .  Will check labs.  Patient's pain is significantly improved after Dilaudid .  CT renal did show a large left-sided kidney stone with some hydronephrosis and some perinephric stranding.  I called and discussed these findings with Dr. Alvaro, from urology, who recommends outpatient follow-up given that she is  nontoxic, afebrile, no evidence of infection.  Patient's pain is well-controlled at time of discharge.  She understands and agrees with the follow-up plan.  Return precautions have been discussed.  Amount and/or Complexity of Data Reviewed Labs: ordered. Radiology: ordered and independent interpretation performed.    Details: Large left kidney stone with hydronephrosis and perinephric stranding  Risk Prescription drug management.         Consultants: I consulted with Dr. Alvaro, who recommends outpatient followup and return for worsening symptoms.   Treatment and Plan: I considered admission due to patient's  initial presentation, but after considering the examination and diagnostic results, patient will not require admission and can be discharged with outpatient follow-up.  Patient discussed with attending physician, Dr. Griselda, who recommends urology consult.  Final Clinical Impressions(s) / ED Diagnoses     ICD-10-CM   1. Kidney stone  N20.0       ED Discharge Orders          Ordered    oxyCODONE -acetaminophen  (PERCOCET/ROXICET) 5-325 MG tablet  Every 6 hours PRN        03/08/24 0320    tamsulosin (FLOMAX) 0.4 MG CAPS capsule  2 times daily        03/08/24 0320    ondansetron  (ZOFRAN -ODT) 4 MG disintegrating tablet  Every 8 hours PRN        03/08/24 0320              Discharge Instructions Discussed with and Provided to Patient:   Discharge Instructions   None      Vicky Charleston, PA-C 03/08/24 0323    Griselda Norris, MD 03/08/24 0445    Vicky Charleston, PA-C 03/08/24 0455    Griselda Norris, MD 03/08/24 [phone]

## 2024-03-10 ENCOUNTER — Emergency Department (HOSPITAL_COMMUNITY)
Admission: EM | Admit: 2024-03-10 | Discharge: 2024-03-10 | Discharge status: Against Medical Advice | Source: Home / Self Care

## 2024-03-11 ENCOUNTER — Other Ambulatory Visit: Payer: Self-pay | Admitting: Urology

## 2024-03-12 NOTE — Patient Instructions (Signed)
 SURGICAL WAITING ROOM VISITATION Patients having surgery or a procedure may have no more than 2 support people in the waiting area - these visitors may rotate in the visitor waiting room.   Due to an increase in RSV and influenza rates and associated hospitalizations, children ages 66 and under may not visit patients in [facility] hospitals. If the patient needs to stay at the hospital during part of their recovery, the visitor guidelines for inpatient rooms apply.  PRE-OP VISITATION  Pre-op nurse will coordinate an appropriate time for 1 support person to accompany the patient in pre-op.  This support person may not rotate.  This visitor will be contacted when the time is appropriate for the visitor to come back in the pre-op area.  Please refer to the [facility] website for the visitor guidelines for Inpatients (after your surgery is over and you are in a regular room).  You are not required to quarantine at this time prior to your surgery. However, you must do this: Hand Hygiene often Do NOT share personal items Notify your provider if you are in close contact with someone who has COVID or you develop fever 100.4 or greater, new onset of sneezing, cough, sore throat, shortness of breath or body aches.  If you test positive for Covid or have been in contact with anyone that has tested positive in the last 10 days please notify you surgeon.    Your procedure is scheduled on:  02/23/24  Report to [facility] Main Entrance: [address] entrance where the [employer] is available.   Report to admitting at: 12:20 PM  Call this number if you have any questions or problems the morning of surgery [phone]  FOLLOW ANY ADDITIONAL PRE OP INSTRUCTIONS YOU RECEIVED FROM YOUR SURGEON'S OFFICE!!!  Do not eat food after Midnight the night prior to your surgery/procedure.  After Midnight you may have the following liquids until: 11:30 AM DAY OF SURGERY  Clear Liquid Diet Water Black  Coffee (sugar ok, NO MILK/CREAM OR CREAMERS)  Tea (sugar ok, NO MILK/CREAM OR CREAMERS) regular and decaf                             Plain Jell-O  with no fruit (NO RED)                                           Fruit ices (not with fruit pulp, NO RED)                                     Popsicles (NO RED)                                                                  Juice: NO CITRUS JUICES: only apple, WHITE grape, WHITE cranberry Sports drinks like Gatorade or Powerade (NO RED)    Oral Hygiene is also important to reduce your risk of infection.        Remember - BRUSH YOUR TEETH THE MORNING OF SURGERY WITH YOUR REGULAR  TOOTHPASTE  Do NOT smoke after Midnight the night before surgery.  STOP TAKING all Vitamins, Herbs and supplements 1 week before your surgery.   Take ONLY these medicines the morning of surgery with A SIP OF WATER: fluoxetine .                   You may not have any metal on your body including hair pins, jewelry, and body piercing  Do not wear make-up, lotions, powders, perfumes / cologne, or deodorant  Do not wear nail polish including gel and S&S, artificial / acrylic nails, or any other type of covering on natural nails including finger and toenails. If you have artificial nails, gel coating, etc., that needs to be removed by a nail salon, Please have this removed prior to surgery. Not doing so may mean that your surgery could be cancelled or delayed if the Surgeon or anesthesia staff feels like they are unable to monitor you safely.   Do not shave 48 hours prior to surgery to avoid nicks in your skin which may contribute to postoperative infections.   Contacts, Hearing Aids, dentures or bridgework may not be worn into surgery. DENTURES WILL BE REMOVED PRIOR TO SURGERY PLEASE DO NOT APPLY Poly grip OR ADHESIVES!!!  You may bring a small overnight bag with you on the day of surgery, only pack items that are not valuable. [facility] IS NOT RESPONSIBLE   FOR  VALUABLES THAT ARE LOST OR STOLEN.   Patients discharged on the day of surgery will not be allowed to drive home.  Someone NEEDS to stay with you for the first 24 hours after anesthesia.  Do not bring your home medications to the hospital. The Pharmacy will dispense medications listed on your medication list to you during your admission in the Hospital.  Special Instructions: Bring a copy of your healthcare power of attorney and living will documents the day of surgery, if you wish to have them scanned into your [facility] Records- EPIC  Please read over the following fact sheets you were given: IF YOU HAVE QUESTIONS ABOUT YOUR PRE-OP INSTRUCTIONS, PLEASE CALL [phone]   [facility] - Preparing for Surgery      Before surgery, you can play an important role.  Because skin is not sterile, your skin needs to be as free of germs as possible.  You can reduce the number of germs on your skin by washing with CHG (chlorahexidine gluconate) soap before surgery.  CHG is an antiseptic cleaner which kills germs and bonds with the skin to continue killing germs even after washing. Please DO NOT use if you have an allergy to CHG or antibacterial soaps.  If your skin becomes reddened/irritated stop using the CHG and inform your nurse when you arrive at Short Stay. Do not shave (including legs and underarms) for at least 48 hours prior to the first CHG shower.  You may shave your face/neck.  Please follow these instructions carefully:  1.  Shower with CHG Soap the night before surgery ONLY (DO NOT USE THE SOAP THE MORNING OF SURGERY).  2.  If you choose to wash your hair, wash your hair first as usual with your normal  shampoo.  3.  After you shampoo, rinse your hair and body thoroughly to remove the shampoo.                             4.  Use CHG as  you would any other liquid soap.  You can apply chg directly to the skin and wash.  Gently with a scrungie or clean washcloth.  5.  Apply the CHG  Soap to your body ONLY FROM THE NECK DOWN.   Do not use on face/ open                           Wound or open sores. Avoid contact with eyes, ears mouth and genitals (private parts).                       Wash face,  Genitals (private parts) with your normal soap.             6.  Wash thoroughly, paying special attention to the area where your  surgery  will be performed.  7.  Thoroughly rinse your body with warm water from the neck down.  8.  DO NOT shower/wash with your normal soap after using and rinsing off the CHG Soap.                9.  Pat yourself dry with a clean towel.            10.  Wear clean pajamas.            11.  Place clean sheets on your bed the night of your first shower and do not  sleep with pets.  Day of Surgery : Do not apply any CHG, lotions/deodorants the morning of surgery.  Please wear clean clothes to the hospital/surgery center.   FAILURE TO FOLLOW THESE INSTRUCTIONS MAY RESULT IN THE CANCELLATION OF YOUR SURGERY  PATIENT SIGNATURE_________________________________  NURSE SIGNATURE__________________________________  ________________________________________________________________________

## 2024-03-12 NOTE — Progress Notes (Signed)
Pt. Needs orders for surgery. 

## 2024-03-13 ENCOUNTER — Encounter (HOSPITAL_COMMUNITY): Payer: Self-pay

## 2024-03-13 ENCOUNTER — Other Ambulatory Visit: Payer: Self-pay

## 2024-03-13 ENCOUNTER — Encounter (HOSPITAL_COMMUNITY)
Admission: RE | Admit: 2024-03-13 | Discharge: 2024-03-13 | Disposition: A | Discharge status: Home / Self Care | Source: Ambulatory Visit | Attending: Urology | Admitting: Urology

## 2024-03-13 ENCOUNTER — Emergency Department (HOSPITAL_COMMUNITY)
Admission: EM | Admit: 2024-03-13 | Discharge: 2024-03-13 | Disposition: A | Discharge status: Home / Self Care | Attending: Emergency Medicine | Admitting: Emergency Medicine

## 2024-03-13 VITALS — BP 158/69 | HR 79 | Temp 98.4°F | Ht 65.0 in | Wt 159.0 lb

## 2024-03-13 DIAGNOSIS — Z79899 Other long term (current) drug therapy: Secondary | ICD-10-CM | POA: Diagnosis not present

## 2024-03-13 DIAGNOSIS — I10 Essential (primary) hypertension: Secondary | ICD-10-CM | POA: Insufficient documentation

## 2024-03-13 DIAGNOSIS — Z01818 Encounter for other preprocedural examination: Secondary | ICD-10-CM | POA: Diagnosis present

## 2024-03-13 DIAGNOSIS — Z01810 Encounter for preprocedural cardiovascular examination: Secondary | ICD-10-CM | POA: Diagnosis not present

## 2024-03-13 DIAGNOSIS — Z7982 Long term (current) use of aspirin: Secondary | ICD-10-CM | POA: Diagnosis not present

## 2024-03-13 DIAGNOSIS — N23 Unspecified renal colic: Secondary | ICD-10-CM | POA: Insufficient documentation

## 2024-03-13 DIAGNOSIS — R10A2 Flank pain, left side: Secondary | ICD-10-CM | POA: Diagnosis present

## 2024-03-13 HISTORY — DX: Anemia, unspecified: D64.9

## 2024-03-13 HISTORY — DX: Prediabetes: R73.03

## 2024-03-13 HISTORY — DX: Malignant (primary) neoplasm, unspecified: C80.1

## 2024-03-13 LAB — CBC WITH DIFFERENTIAL/PLATELET
Abs Immature Granulocytes: 0.04 K/uL (ref 0.00–0.07)
Basophils Absolute: 0 K/uL (ref 0.0–0.1)
Basophils Relative: 0 %
Eosinophils Absolute: 0 K/uL (ref 0.0–0.5)
Eosinophils Relative: 0 %
HCT: 37.2 % (ref 36.0–46.0)
Hemoglobin: 12.3 g/dL (ref 12.0–15.0)
Immature Granulocytes: 0 %
Lymphocytes Relative: 6 %
Lymphs Abs: 0.6 K/uL — ABNORMAL LOW (ref 0.7–4.0)
MCH: 32.9 pg (ref 26.0–34.0)
MCHC: 33.1 g/dL (ref 30.0–36.0)
MCV: 99.5 fL (ref 80.0–100.0)
Monocytes Absolute: 0.6 K/uL (ref 0.1–1.0)
Monocytes Relative: 6 %
Neutro Abs: 8.5 K/uL — ABNORMAL HIGH (ref 1.7–7.7)
Neutrophils Relative %: 88 %
Platelets: 268 K/uL (ref 150–400)
RBC: 3.74 MIL/uL — ABNORMAL LOW (ref 3.87–5.11)
RDW: 12.5 % (ref 11.5–15.5)
WBC: 9.8 K/uL (ref 4.0–10.5)
nRBC: 0 % (ref 0.0–0.2)

## 2024-03-13 LAB — BASIC METABOLIC PANEL WITH GFR
Anion gap: 10 (ref 5–15)
BUN: 15 mg/dL (ref 8–23)
CO2: 29 mmol/L (ref 22–32)
Calcium: 10.1 mg/dL (ref 8.9–10.3)
Chloride: 103 mmol/L (ref 98–111)
Creatinine, Ser: 1.07 mg/dL — ABNORMAL HIGH (ref 0.44–1.00)
GFR, Estimated: 56 mL/min — ABNORMAL LOW (ref >60)
Glucose, Bld: 136 mg/dL — ABNORMAL HIGH (ref 70–99)
Potassium: 4.1 mmol/L (ref 3.5–5.1)
Sodium: 142 mmol/L (ref 135–145)

## 2024-03-13 LAB — URINALYSIS, ROUTINE W REFLEX MICROSCOPIC
Bacteria, UA: NONE SEEN
Bilirubin Urine: NEGATIVE
Glucose, UA: NEGATIVE mg/dL
Ketones, ur: NEGATIVE mg/dL
Leukocytes,Ua: NEGATIVE
Nitrite: NEGATIVE
Protein, ur: 100 mg/dL — AB
Specific Gravity, Urine: 1.016 (ref 1.005–1.030)
pH: 8 (ref 5.0–8.0)

## 2024-03-13 MED ORDER — ONDANSETRON 4 MG PO TBDP: 4.0000 mg | ORAL_TABLET | Freq: Three times a day (TID) | ORAL | 0 refills | Status: AC | PRN

## 2024-03-13 MED ORDER — HYDROMORPHONE HCL 1 MG/ML IJ SOLN
0.5000 mg | Freq: Once | INTRAMUSCULAR | Status: AC
  Administered 2024-03-13: 0.5 mg via INTRAVENOUS
  Filled 2024-03-13: qty 1

## 2024-03-13 MED ORDER — KETOROLAC TROMETHAMINE 15 MG/ML IJ SOLN
15.0000 mg | Freq: Once | INTRAMUSCULAR | Status: AC
  Administered 2024-03-13: 15 mg via INTRAVENOUS
  Filled 2024-03-13: qty 1

## 2024-03-13 MED ORDER — ONDANSETRON HCL 4 MG/2ML IJ SOLN
4.0000 mg | Freq: Once | INTRAMUSCULAR | Status: AC
Start: 2024-03-13 — End: 2024-03-13
  Administered 2024-03-13: 4 mg via INTRAVENOUS
  Filled 2024-03-13: qty 2

## 2024-03-13 NOTE — ED Provider Notes (Signed)
 [place] EMERGENCY DEPARTMENT AT [facility] Provider Note   CSN: [id number] Arrival date & time: 03/13/24  1757     Patient presents with: Flank Pain   April Castillo is a 68 y.o. female.   Patient with history of hypertension, no previous abdominal surgeries, diagnosis of kidney stone on 11/16 (asymmetric perinephric edema and renal edema and moderate hydronephrosis due to a 9 x 5 x 5 mm UPJ stone), followed up with urology and has plans for stent placement on 11/24.  Patient states that she was prescribed oxycodone  which had been helping, but now she gets very short relief before pain became severe.  She has had a lot of nausea without vomiting.  No fevers.  She complains of generalized left flank pain and back pain.  She is taking Flomax .       Prior to Admission medications   Medication Sig Start Date End Date Taking? Authorizing Provider  aspirin  EC 81 MG tablet Take 81 mg by mouth daily. Swallow whole.    [provider]  Calcium  Carb-Cholecalciferol (CALCIUM  + VITAMIN D3 PO) Take 1 tablet by mouth daily.    [provider]  carboxymethylcellulose 1 % ophthalmic solution Place 1 drop into both eyes daily as needed (Moisturing eye).    [provider]  DENOSUMAB [place] Inject into the skin every 6 (six) months.    [provider]  FLUoxetine  (PROZAC ) 20 MG capsule Take 1 capsule (20 mg total) by mouth daily. 01/15/18   Benjamine Hamilton, DO  lisinopril  (PRINIVIL ,ZESTRIL ) 10 MG tablet TAKE 1 TABLET DAILY 10/03/17   Prentiss Dorothyann Maxwell, MD  Multiple Vitamin (MULTIVITAMIN) capsule Take 1 capsule by mouth daily.    [provider]  NON FORMULARY DIET: REGULAR, NAS, HEART HEALTHY 11/06/19   [provider]  ondansetron  (ZOFRAN -ODT) 4 MG disintegrating tablet Take 1 tablet (4 mg total) by mouth every 8 (eight) hours as needed for nausea or vomiting. 03/08/24   Vicky Charleston, PA-C  oxyCODONE -acetaminophen   (PERCOCET/ROXICET) 5-325 MG tablet Take 1-2 tablets by mouth every 6 (six) hours as needed for severe pain (pain score 7-10). 03/08/24   Vicky Charleston, PA-C  rosuvastatin  (CRESTOR ) 20 MG tablet Take 20 mg by mouth daily.    [provider]  tamsulosin  (FLOMAX ) 0.4 MG CAPS capsule Take 1 capsule (0.4 mg total) by mouth 2 (two) times daily. Patient taking differently: Take 0.4 mg by mouth 2 (two) times daily as needed (stones). 03/08/24   Vicky Charleston, PA-C    Allergies: Patient has no known allergies.    Review of Systems  Updated Vital Signs BP (!) 179/85   Pulse (!) 103   Temp 98.7 F (37.1 C) (Oral)   Resp 16   SpO2 94%   Physical Exam Vitals and nursing note reviewed.  Constitutional:      General: She is not in acute distress.    Appearance: She is well-developed.  HENT:     Head: Normocephalic and atraumatic.     Right Ear: External ear normal.     Left Ear: External ear normal.     Nose: Nose normal.  Eyes:     Conjunctiva/sclera: Conjunctivae normal.  Cardiovascular:     Rate and Rhythm: Normal rate and regular rhythm.     Heart sounds: No murmur heard. Pulmonary:     Effort: No respiratory distress.     Breath sounds: No wheezing, rhonchi or rales.  Abdominal:     Palpations: Abdomen is soft.  Tenderness: There is abdominal tenderness. There is left CVA tenderness. There is no right CVA tenderness, guarding or rebound.  Musculoskeletal:     Cervical back: Normal range of motion and neck supple.     Right lower leg: No edema.     Left lower leg: No edema.  Skin:    General: Skin is warm and dry.     Findings: No rash.  Neurological:     General: No focal deficit present.     Mental Status: She is alert. Mental status is at baseline.     Motor: No weakness.  Psychiatric:        Mood and Affect: Mood normal.     (all labs ordered are listed, but only abnormal results are displayed) Labs Reviewed  CBC WITH DIFFERENTIAL/PLATELET -  Abnormal; Notable for the following components:      Result Value   RBC 3.74 (*)    Neutro Abs 8.5 (*)    Lymphs Abs 0.6 (*)    All other components within normal limits  BASIC METABOLIC PANEL WITH GFR - Abnormal; Notable for the following components:   Glucose, Bld 136 (*)    Creatinine, Ser 1.07 (*)    GFR, Estimated 56 (*)    All other components within normal limits  URINALYSIS, ROUTINE W REFLEX MICROSCOPIC - Abnormal; Notable for the following components:   APPearance CLOUDY (*)    Hgb urine dipstick SMALL (*)    Protein, ur 100 (*)    All other components within normal limits    EKG: None  Radiology: No results found.   Procedures   Medications Ordered in the ED  HYDROmorphone  (DILAUDID ) injection 0.5 mg (0.5 mg Intravenous Given 03/13/24 2050)  ondansetron  (ZOFRAN ) injection 4 mg (4 mg Intravenous Given 03/13/24 2053)  ketorolac  (TORADOL ) 15 MG/ML injection 15 mg (15 mg Intravenous Given 03/13/24 2116)   ED Course  Patient seen and examined. History obtained directly from patient.   Labs/EKG: Ordered CBC, BMP, UA.  Imaging: None ordered  Medications/Fluids: Ordered: IV hydromorphone , Zofran , Toradol .   Most recent vital signs reviewed and are as follows: BP (!) 179/85   Pulse (!) 103   Temp 98.7 F (37.1 C) (Oral)   Resp 16   SpO2 94%   Initial impression: Left-sided ureteral colic  11:07 PM Reassessment performed. Patient appears stable, comfortable.  She states that she is feeling much better.  She is more confident about going home and controlling symptoms.  She would like to try to make it until Monday to have her procedure.  Labs personally reviewed and interpreted including: CBC with normal white blood cell count, normal hemoglobin; BMP with creatinine at 1.07 up modestly from 0.85 at previous visit; UA without compelling signs of infection.  Reviewed pertinent lab work and imaging with patient at bedside. Questions answered.   Most current vital  signs reviewed and are as follows: BP (!) 146/76   Pulse 85   Temp 98.6 F (37 C)   Resp 17   SpO2 90%   Plan: Discharge to home.  We discussed utility of NSAIDs in addition to her other medications.  She does not have any contraindications to this.  She has not been taking NSAIDs up to this point.  She has gotten good improvement with Toradol .  Prescriptions written for: Zofran   Return instructions: Return with uncontrolled pain, vomiting, fever  Follow-up: Urology as planned on Monday for her procedure  Medical Decision Making Amount and/or Complexity of Data Reviewed Labs: ordered.  Risk Prescription drug management.   Patient with ongoing left-sided ureteral colic.  Her pain control has been less than desired at home on oral medications.  She has not been taking NSAIDs.  She has had Flomax  and Zofran .  She is very nauseous and has decreased appetite but is not vomiting.  She was given IV medications here with very good improvement in her symptoms.  No indications for imaging at this time.  Her kidney function remains near baseline and no evidence of UTI or pyelonephritis at this time.  She has definitive care in 3 days with urology.  She has sufficient pain medication at home.  Encouraged use of NSAIDs.  Return instructions as above.  The patient's vital signs, pertinent lab work and imaging were reviewed and interpreted as discussed in the ED course. Hospitalization was considered for further testing, treatments, or serial exams/observation. However as patient is well-appearing, has a stable exam, and reassuring studies today, I do not feel that they warrant admission at this time. This plan was discussed with the patient who verbalizes agreement and comfort with this plan and seems reliable and able to return to the Emergency Department with worsening or changing symptoms.       Final diagnoses:  Ureteral colic    ED Discharge Orders           Ordered    ondansetron  (ZOFRAN -ODT) 4 MG disintegrating tablet  Every 8 hours PRN        03/13/24 2257               Desiderio Chew, PA-C 03/13/24 2311    Charlyn Sora, MD 03/14/24 1445

## 2024-03-13 NOTE — Discharge Instructions (Signed)
 Please read and follow all provided instructions.  Your diagnoses today include:  1. Ureteral colic     Tests performed today include: Urine test that showed blood in your urine and no infection Blood test that showed normal kidney function Vital signs. See below for your results today.   Medications prescribed:  Zofran  (ondansetron ) - for nausea and vomiting  Please use over-the-counter NSAID medications (ibuprofen , naproxen) or Tylenol  (acetaminophen ) as directed on the packaging for pain -- as long as you do not have any reasons avoid these medications. Reasons to avoid NSAID medications include: weak kidneys, a history of bleeding in your stomach or gut, or uncontrolled high blood pressure or previous heart attack. Reasons to avoid Tylenol  include: liver problems or ongoing alcohol use. Never take more than 4000mg  or 8 Extra strength Tylenol  in a 24 hour period.     Take any prescribed medications only as directed.  Home care instructions:  Follow any educational materials contained in this packet.  Please double your fluid intake for the next several days. Strain your urine and save any stones that may pass.   BE VERY CAREFUL not to take multiple medicines containing Tylenol  (also called acetaminophen ). Doing so can lead to an overdose which can damage your liver and cause liver failure and possibly death.   Follow-up instructions: Please follow-up with your urologist or the urologist referral (provided on front page) in the next 1 week for further evaluation of your symptoms.  Return instructions:  If you need to return to the Emergency Department, go to [facility] and not [facility]. The urologists are located at [facility] and can better care for you at this location.  Please return to the Emergency Department if you experience worsening symptoms.  Please return if you develop fever or uncontrolled pain or vomiting. Please return if you have any other  emergent concerns.  Additional Information:  Your vital signs today were: BP (!) 146/76   Pulse 85   Temp 98.6 F (37 C)   Resp 17   SpO2 90%  If your blood pressure (BP) was elevated above 135/85 this visit, please have this repeated by your doctor within one month. --------------

## 2024-03-13 NOTE — Progress Notes (Addendum)
 For Anesthesia: PCP - Cesario Mutton, MD  Cardiologist - N/A  Bowel Prep reminder:  Chest x-ray -  EKG - 03/13/24 Stress Test -  ECHO -  Cardiac Cath -  Pacemaker/ICD device last checked: Pacemaker orders received: Device Rep notified:  Spinal Cord Stimulator:N/A  Sleep Study - N/A CPAP -   Fasting Blood Sugar - N/A Checks Blood Sugar _____ times a day Date and result of last Hgb A1c-  Last dose of GLP1 agonist- N/A GLP1 instructions: Hold 7 days prior to schedule (Hold 24 hours-daily)   Last dose of SGLT-2 inhibitors- N/A SGLT-2 instructions: Hold 72 hours prior to surgery  Blood Thinner Instructions:N/A Last Dose: Time last taken:  Aspirin  Instructions: It's on hold. Last Dose: Time last taken:  Activity level: Can go up a flight of stairs and activities of daily living without stopping and without chest pain and/or shortness of breath   Able to exercise without chest pain and/or shortness of breath     Anesthesia review: Hx: HTN,Mitral valve prolapse,murmur, on Echo [place] PA is aware.  Patient denies shortness of breath, fever, cough and chest pain at PAT appointment   Patient verbalized understanding of instructions that were reviewed over the telephone.

## 2024-03-13 NOTE — ED Triage Notes (Signed)
 Pt ambulatory to triage with complaints of LEFT flank pain, and nausea. Pt reports that she was dx with a kidney stone on 11/16, pt is due to have a stent placed on Monday but her pain has worsened.   Pt appears uncomfortable during triage.

## 2024-03-14 NOTE — Anesthesia Preprocedure Evaluation (Signed)
 Anesthesia Evaluation  Patient identified by MRN, date of birth, ID band Patient awake    Reviewed: Allergy & Precautions, NPO status , Patient's Chart, lab work & pertinent test results  History of Anesthesia Complications Negative for: history of anesthetic complications  Airway Mallampati: II  TM Distance: >3 FB Neck ROM: Full    Dental no notable dental hx. (+) Teeth Intact, Dental Advisory Given   Pulmonary neg pulmonary ROS   Pulmonary exam normal breath sounds clear to auscultation       Cardiovascular hypertension, (-) angina (-) Past MI Normal cardiovascular exam Rhythm:Regular Rate:Normal     Neuro/Psych  Headaches PSYCHIATRIC DISORDERS  Depression       GI/Hepatic negative GI ROS, Neg liver ROS,,,  Endo/Other    Renal/GU Renal diseaseLab Results      Component                Value               Date                                 K                        4.1                 03/13/2024                CO2                      29                  03/13/2024                BUN                      15                  03/13/2024                CREATININE               1.07 (H)            03/13/2024                   Musculoskeletal   Abdominal   Peds  Hematology Lab Results      Component                Value               Date                      WBC                      9.8                 03/13/2024                HGB                      12.3                03/13/2024                HCT  37.2                03/13/2024                MCV                      99.5                03/13/2024                PLT                      268                 03/13/2024              Anesthesia Other Findings   Reproductive/Obstetrics                              Anesthesia Physical Anesthesia Plan  ASA: 2  Anesthesia Plan: General   Post-op Pain Management:  Ofirmev  IV (intra-op)*   Induction: Intravenous  PONV Risk Score and Plan: Treatment may vary due to age or medical condition, Midazolam  and Ondansetron   Airway Management Planned: LMA  Additional Equipment: None  Intra-op Plan:   Post-operative Plan: Extubation in OR  Informed Consent: I have reviewed the patients History and Physical, chart, labs and discussed the procedure including the risks, benefits and alternatives for the proposed anesthesia with the patient or authorized representative who has indicated his/her understanding and acceptance.     Dental advisory given  Plan Discussed with: CRNA and Surgeon  Anesthesia Plan Comments:          Anesthesia Quick Evaluation

## 2024-03-16 ENCOUNTER — Encounter (HOSPITAL_COMMUNITY)
Admission: RE | Disposition: A | Discharge status: Home / Self Care | Payer: Self-pay | Source: Home / Self Care | Attending: Urology

## 2024-03-16 ENCOUNTER — Ambulatory Visit (HOSPITAL_COMMUNITY)

## 2024-03-16 ENCOUNTER — Encounter (HOSPITAL_COMMUNITY): Payer: Self-pay | Admitting: Urology

## 2024-03-16 ENCOUNTER — Ambulatory Visit (HOSPITAL_COMMUNITY)
Admission: RE | Admit: 2024-03-16 | Discharge: 2024-03-16 | Disposition: A | Discharge status: Home / Self Care | Attending: Urology | Admitting: Urology

## 2024-03-16 ENCOUNTER — Ambulatory Visit (HOSPITAL_COMMUNITY): Payer: Self-pay | Admitting: Physician Assistant

## 2024-03-16 ENCOUNTER — Ambulatory Visit (HOSPITAL_COMMUNITY): Payer: Self-pay | Admitting: Anesthesiology

## 2024-03-16 DIAGNOSIS — N132 Hydronephrosis with renal and ureteral calculous obstruction: Secondary | ICD-10-CM | POA: Diagnosis not present

## 2024-03-16 DIAGNOSIS — I10 Essential (primary) hypertension: Secondary | ICD-10-CM | POA: Diagnosis not present

## 2024-03-16 DIAGNOSIS — N200 Calculus of kidney: Secondary | ICD-10-CM | POA: Diagnosis present

## 2024-03-16 DIAGNOSIS — I252 Old myocardial infarction: Secondary | ICD-10-CM | POA: Insufficient documentation

## 2024-03-16 DIAGNOSIS — N201 Calculus of ureter: Secondary | ICD-10-CM | POA: Diagnosis not present

## 2024-03-16 HISTORY — PX: CYSTOSCOPY W/ RETROGRADES: SHX1426

## 2024-03-16 HISTORY — PX: CYSTOSCOPY/URETEROSCOPY/HOLMIUM LASER/STENT PLACEMENT: SHX6546

## 2024-03-16 SURGERY — CYSTOSCOPY/URETEROSCOPY/HOLMIUM LASER/STENT PLACEMENT
Anesthesia: General | Site: Ureter | Laterality: Left

## 2024-03-16 MED ORDER — FENTANYL CITRATE (PF) 50 MCG/ML IJ SOSY
PREFILLED_SYRINGE | INTRAMUSCULAR | Status: AC
  Filled 2024-03-16: qty 1

## 2024-03-16 MED ORDER — SODIUM CHLORIDE 0.9 % IR SOLN
Status: DC | PRN
  Administered 2024-03-16 (×2): 3000 mL via INTRAVESICAL

## 2024-03-16 MED ORDER — IOHEXOL 300 MG/ML  SOLN
INTRAMUSCULAR | Status: DC | PRN
  Administered 2024-03-16: 9 mL via URETHRAL

## 2024-03-16 MED ORDER — ONDANSETRON HCL 4 MG/2ML IJ SOLN
INTRAMUSCULAR | Status: AC
  Filled 2024-03-16: qty 2

## 2024-03-16 MED ORDER — ACETAMINOPHEN 10 MG/ML IV SOLN
INTRAVENOUS | Status: AC
  Filled 2024-03-16: qty 100

## 2024-03-16 MED ORDER — PHENYLEPHRINE HCL (PRESSORS) 10 MG/ML IV SOLN
INTRAVENOUS | Status: DC | PRN
  Administered 2024-03-16: 80 ug via INTRAVENOUS
  Administered 2024-03-16: 160 ug via INTRAVENOUS
  Administered 2024-03-16: 80 ug via INTRAVENOUS
  Administered 2024-03-16: 160 ug via INTRAVENOUS

## 2024-03-16 MED ORDER — ORAL CARE MOUTH RINSE: 15.0000 mL | Freq: Once | OROMUCOSAL | Status: AC

## 2024-03-16 MED ORDER — CEFAZOLIN SODIUM-DEXTROSE 2-4 GM/100ML-% IV SOLN
2.0000 g | INTRAVENOUS | Status: AC
  Administered 2024-03-16: 2 g via INTRAVENOUS
  Filled 2024-03-16: qty 100

## 2024-03-16 MED ORDER — EPHEDRINE SULFATE (PRESSORS) 25 MG/5ML IV SOSY
PREFILLED_SYRINGE | INTRAVENOUS | Status: DC | PRN
  Administered 2024-03-16: 5 mg via INTRAVENOUS
  Administered 2024-03-16: 10 mg via INTRAVENOUS

## 2024-03-16 MED ORDER — PROPOFOL 10 MG/ML IV BOLUS
INTRAVENOUS | Status: DC | PRN
  Administered 2024-03-16: 50 mg via INTRAVENOUS
  Administered 2024-03-16: 150 mg via INTRAVENOUS

## 2024-03-16 MED ORDER — FENTANYL CITRATE (PF) 50 MCG/ML IJ SOSY
25.0000 ug | PREFILLED_SYRINGE | INTRAMUSCULAR | Status: DC | PRN
  Administered 2024-03-16: 50 ug via INTRAVENOUS

## 2024-03-16 MED ORDER — ACETAMINOPHEN 10 MG/ML IV SOLN
1000.0000 mg | Freq: Once | INTRAVENOUS | Status: DC | PRN
  Administered 2024-03-16: 1000 mg via INTRAVENOUS

## 2024-03-16 MED ORDER — FENTANYL CITRATE (PF) 100 MCG/2ML IJ SOLN
INTRAMUSCULAR | Status: DC | PRN
  Administered 2024-03-16: 50 ug via INTRAVENOUS

## 2024-03-16 MED ORDER — ONDANSETRON HCL 4 MG/2ML IJ SOLN
4.0000 mg | Freq: Once | INTRAMUSCULAR | Status: AC | PRN
  Administered 2024-03-16: 4 mg via INTRAVENOUS

## 2024-03-16 MED ORDER — OXYCODONE-ACETAMINOPHEN 5-325 MG PO TABS: 1.0000 | ORAL_TABLET | Freq: Four times a day (QID) | ORAL | 0 refills | Status: AC | PRN

## 2024-03-16 MED ORDER — LIDOCAINE HCL URETHRAL/MUCOSAL 2 % EX GEL
CUTANEOUS | Status: AC
  Filled 2024-03-16: qty 30

## 2024-03-16 MED ORDER — PROPOFOL 10 MG/ML IV BOLUS
INTRAVENOUS | Status: AC
  Filled 2024-03-16: qty 20

## 2024-03-16 MED ORDER — EPHEDRINE 5 MG/ML INJ
INTRAVENOUS | Status: AC
  Filled 2024-03-16: qty 5

## 2024-03-16 MED ORDER — DEXAMETHASONE SODIUM PHOSPHATE 4 MG/ML IJ SOLN
INTRAMUSCULAR | Status: DC | PRN
  Administered 2024-03-16: 4 mg via INTRAVENOUS

## 2024-03-16 MED ORDER — CHLORHEXIDINE GLUCONATE 0.12 % MT SOLN
15.0000 mL | Freq: Once | OROMUCOSAL | Status: AC
  Administered 2024-03-16: 15 mL via OROMUCOSAL

## 2024-03-16 MED ORDER — FENTANYL CITRATE (PF) 100 MCG/2ML IJ SOLN
INTRAMUSCULAR | Status: AC
  Filled 2024-03-16: qty 2

## 2024-03-16 MED ORDER — MIDAZOLAM HCL 5 MG/5ML IJ SOLN
INTRAMUSCULAR | Status: DC | PRN
  Administered 2024-03-16: 2 mg via INTRAVENOUS

## 2024-03-16 MED ORDER — MIDAZOLAM HCL 2 MG/2ML IJ SOLN
INTRAMUSCULAR | Status: AC
  Filled 2024-03-16: qty 2

## 2024-03-16 MED ORDER — LACTATED RINGERS IV SOLN: INTRAVENOUS | Status: DC

## 2024-03-16 MED ORDER — LIDOCAINE 2% (20 MG/ML) 5 ML SYRINGE
INTRAMUSCULAR | Status: DC | PRN
  Administered 2024-03-16: 80 mg via INTRAVENOUS

## 2024-03-16 SURGICAL SUPPLY — 27 items
BAG URO CATCHER STRL LF (MISCELLANEOUS) ×1 IMPLANT
BASKET ZERO TIP NITINOL 2.4FR (BASKET) IMPLANT
BENZOIN TINCTURE PRP APPL 2/3 (GAUZE/BANDAGES/DRESSINGS) IMPLANT
CATH URETERAL DUAL LUMEN 10F (MISCELLANEOUS) IMPLANT
CATH URETL OPEN 5X70 (CATHETERS) IMPLANT
CATH URETL OPEN END 6FR 70 (CATHETERS) IMPLANT
CLOTH BEACON ORANGE TIMEOUT ST (SAFETY) ×1 IMPLANT
DRSG TEGADERM 2-3/8X2-3/4 SM (GAUZE/BANDAGES/DRESSINGS) IMPLANT
FIBER LASER MOSES 200 DFL (Laser) IMPLANT
GLOVE BIOGEL M 7.0 STRL (GLOVE) ×1 IMPLANT
GLOVE SURG SS PI 7.0 STRL IVOR (GLOVE) IMPLANT
GOWN STRL REUS W/ TWL LRG LVL3 (GOWN DISPOSABLE) IMPLANT
GOWN STRL REUS W/ TWL XL LVL3 (GOWN DISPOSABLE) ×1 IMPLANT
GUIDEWIRE STR DUAL SENSOR (WIRE) ×2 IMPLANT
GUIDEWIRE ZIPWRE .038 STRAIGHT (WIRE) IMPLANT
KIT TURNOVER KIT A (KITS) ×1 IMPLANT
LEGGING LITHOTOMY PAIR STRL (DRAPES) IMPLANT
MANIFOLD NEPTUNE II (INSTRUMENTS) ×1 IMPLANT
NS IRRIG 1000ML POUR BTL (IV SOLUTION) IMPLANT
PACK CYSTO (CUSTOM PROCEDURE TRAY) ×1 IMPLANT
PAD PREP 24X48 CUFFED NSTRL (MISCELLANEOUS) ×1 IMPLANT
SHEATH DILATOR SET 8/10 (MISCELLANEOUS) IMPLANT
SHEATH NAVIGATOR HD 11/13X28 (SHEATH) IMPLANT
SHEATH NAVIGATOR HD 11/13X36 (SHEATH) IMPLANT
STENT URET 6FRX24 CONTOUR (STENTS) IMPLANT
TUBING CONNECTING 10 (TUBING) ×1 IMPLANT
TUBING UROLOGY SET (TUBING) ×1 IMPLANT

## 2024-03-16 NOTE — Discharge Instructions (Signed)
[facility] °[phone] °Post Ureteroscopy With or Without Stent Instructions ° °Definitions: ° °Ureter: The duct that transports urine from the kidney to the bladder. °Stent:   A plastic hollow tube that is placed into the ureter, from the kidney to the                 bladder to prevent the ureter from swelling shut. ° °GENERAL INSTRUCTIONS: ° °Despite the fact that no skin incisions were used, the area around the ureter and bladder is raw and irritated. The stent is a foreign body which will further irritate the bladder wall. This irritation is manifested by increased frequency of urination, both day and night, and by an increase in the urge to urinate. In some, the urge to urinate is present almost always. Sometimes the urge is strong enough that you may not be able to stop yourself from urinating. The only real cure is to remove the stent and then give time for the bladder wall to heal which can't be done until the danger of the ureter swelling shut has passed, which varies. ° °You may see some blood in your urine while the stent is in place and a few days afterwards. Do not be alarmed, even if the urine was clear for a while. Get off your feet and drink lots of fluids until clearing occurs. If you start to pass clots or don't improve, call us. ° °DIET: °You may return to your normal diet immediately. Because of the raw surface of your bladder, alcohol, spicy foods, acid type foods and drinks with caffeine may cause irritation or frequency and should be used in moderation. To keep your urine flowing freely and to avoid constipation, drink plenty of fluids during the day ( 8-10 glasses ). °Tip: Avoid cranberry juice because it is very acidic. ° °ACTIVITY: °Your physical activity doesn't need to be restricted. However, if you are very active, you may see some blood in your urine. We suggest that you reduce your activity under these circumstances until the bleeding has stopped. ° °BOWELS: °It is  important to keep your bowels regular during the postoperative period. Straining with bowel movements can cause bleeding. A bowel movement every other day is reasonable. Use a mild laxative if needed, such as Milk of Magnesia 2-3 tablespoons, or 2 Dulcolax tablets. Call if you continue to have problems. If you have been taking narcotics for pain, before, during or after your surgery, you may be constipated. Take a laxative if necessary. ° ° °MEDICATION: °You should resume your pre-surgery medications unless told not to. In addition you will often be given an antibiotic to prevent infection. These should be taken as prescribed until the bottles are finished unless you are having an unusual reaction to one of the drugs. ° °PROBLEMS YOU SHOULD REPORT TO US: °· Fevers over 100.5 Fahrenheit. °· Heavy bleeding, or clots ( See above notes about blood in urine ). °· Inability to urinate. °· Drug reactions ( hives, rash, nausea, vomiting, diarrhea ). °· Severe burning or pain with urination that is not improving. ° °FOLLOW-UP: °You will need a follow-up appointment to monitor your progress. Call for this appointment at the number listed above. Usually the first appointment will be about three to fourteen days after your surgery. ° ° ° ° ° °

## 2024-03-16 NOTE — Anesthesia Procedure Notes (Signed)
 Procedure Name: LMA Insertion Date/Time: 03/16/2024 3:46 PM  Performed by: Nada Corean CROME, CRNAPre-anesthesia Checklist: Patient identified, Emergency Drugs available, Suction available, Patient being monitored and Timeout performed Patient Re-evaluated:Patient Re-evaluated prior to induction Oxygen Delivery Method: Circle system utilized Preoxygenation: Pre-oxygenation with 100% oxygen Induction Type: IV induction LMA: LMA inserted LMA Size: 4.0 Number of attempts: 1 Placement Confirmation: positive ETCO2 Tube secured with: Tape Dental Injury: Teeth and Oropharynx as per pre-operative assessment

## 2024-03-16 NOTE — Transfer of Care (Signed)
 Immediate Anesthesia Transfer of Care Note  Patient: April Castillo  Procedure(s) Performed: CYSTOSCOPY/URETEROSCOPY/HOLMIUM LASER/STENT PLACEMENT (Left: Ureter) CYSTOSCOPY, WITH RETROGRADE PYELOGRAM (Left: Ureter)  Patient Location: PACU  Anesthesia Type:General  Level of Consciousness: awake, alert , oriented, and patient cooperative  Airway & Oxygen Therapy: Patient Spontanous Breathing and Patient connected to face mask oxygen  Post-op Assessment: Report given to RN and Post -op Vital signs reviewed and stable  Post vital signs: Reviewed and stable  Last Vitals:  Vitals Value Taken Time  BP 148/72 03/16/24 17:00  Temp    Pulse 88 03/16/24 17:01  Resp 14 03/16/24 17:01  SpO2 100 % 03/16/24 17:01  Vitals shown include unfiled device data.  Last Pain:  Vitals:   03/16/24 1300  TempSrc:   PainSc: 0-No pain         Complications: No notable events documented.

## 2024-03-16 NOTE — Anesthesia Postprocedure Evaluation (Signed)
 Anesthesia Post Note  Patient: [name]  Procedure(s) Performed: CYSTOSCOPY/URETEROSCOPY/HOLMIUM LASER/STENT PLACEMENT (Left: Ureter) CYSTOSCOPY, WITH RETROGRADE PYELOGRAM (Left: Ureter)     Patient location during evaluation: PACU Anesthesia Type: General Level of consciousness: awake and alert Pain management: pain level controlled Vital Signs Assessment: post-procedure vital signs reviewed and stable Respiratory status: spontaneous breathing, nonlabored ventilation, respiratory function stable and patient connected to nasal cannula oxygen Cardiovascular status: blood pressure returned to baseline and stable Postop Assessment: no apparent nausea or vomiting Anesthetic complications: no   No notable events documented.  Last Vitals:  Vitals:   03/16/24 1842 03/16/24 1850  BP:  (!) 162/77  Pulse:    Resp: 18   Temp: 36.6 C   SpO2:      Last Pain:  Vitals:   03/16/24 1842  TempSrc:   PainSc: 0-No pain                 Garnette DELENA Gab

## 2024-03-16 NOTE — Op Note (Signed)
 Operative Note  Preoperative diagnosis:  1.  Left ureteral stone  Postoperative diagnosis: 1.  Left ureteral stone  Procedure(s): 1.  Cystoscopy 2. Left ureteroscopy with laser lithotripsy and basket extraction of stones 3. Left retrograde pyelogram 4. Left ureteral stent placement 5. Fluoroscopy with intraoperative interpretation  Surgeon: Donnice Siad, MD  Assistants:  None  Anesthesia:  General  Complications:  None  EBL:  Minimal  Specimens: 1. Stones for stone analysis (to be done at [facility])  Drains/Catheters: 1.  Left 6Fr x 24cm ureteral stent without a tether string  Intraoperative findings:   Cystoscopy demonstrated no suspicious bladder lesions. Left ureteroscopy demonstrated impacted left 9mm ureteral stone in the proximal left ureter. Left retrograde pyelogram with severe left hydronephrosis. Successful stent placement.  Indication:  April Castillo is a 68 y.o. female with an impacted 9mm proximal ureteral stone.  Description of procedure: After informed consent was obtained from the patient, the patient was identified and taken to the operating room and placed in the supine position.  General anesthesia was administered as well as perioperative IV antibiotics.  At the beginning of the case, a time-out was performed to properly identify the patient, the surgery to be performed, and the surgical site.  Sequential compression devices were applied to the lower extremities at the beginning of the case for DVT prophylaxis.  The patient was then placed in the dorsal lithotomy supine position, prepped and draped in sterile fashion.  We then passed the 21-French rigid cystoscope through the urethra and into the bladder under vision without any difficulty, noting a normal urethra without strictures.  A systematic evaluation of the bladder revealed no evidence of any suspicious bladder lesions.  Ureteral orifices were in normal position.    Under cystoscopic and  flouroscopic guidance, we cannulated the left ureteral orifice with a 5-French open-ended ureteral catheter and a gentle retrograde pyelogram was performed, revealing a normal caliber ureter without any filling defects. There was severe left hydronephrosis of the collecting system. A 0.038 sensor wire was then passed up to the level of the renal pelvis and secured to the drape as a safety wire. The ureteral catheter and cystoscope were removed, leaving the safety wire in place.   A semi-rigid ureteroscope was passed alongside the wire up the distal ureter which appeared normal. I encountered an impacted 9mm proximal left ureteral stone. Using a laser, this stone was fragmented completely and basket extracted. A second 0.038 sensor wire was passed under direct vision and the semirigid scope was removed.  The flexible ureteroscope was advanced into the collecting system via the access sheath. The collecting system was inspected. There were no further stones present.  We then withdrew the ureteroscope back down the ureter noting no evidence of any stones along the course of the ureter.  Prior to removing the ureteroscope, we did pass the Glidewire back up to the ureter to the renal pelvis.  Once the ureteroscope was removed, we then used the Glidewire under fluoroscopic guidance and passed up a 6-French x 24 cm double-pigtail ureteral stent up the ureter, making sure that the proximal and distal ends coiled within the kidney and bladder respectively.  The cystoscope was then advanced back into the bladder under vision.  We were able to see the distal stent coiling nicely within the bladder.  The bladder was then emptied with irrigation solution.  The cystoscope was then removed.    The patient tolerated the procedure well and there was no complication. Patient  was awoken from anesthesia and taken to the recovery room in stable condition. I was present and scrubbed for the entirety of the case.  Plan:  Patient  will be discharged home.  Follow up with me in 7 to 10 days for stent removal in the office.   Matt R. Zakaiya Lares MD [facility]  Pager: [phone]

## 2024-03-16 NOTE — H&P (Signed)
 Urology Preoperative H&P   Chief Complaint: Left ureteral stone  History of Present Illness: April Castillo is a 68 y.o. female with left ureteral stone here for URS/LL/stent.  1. Urolithiasis: - Presented to ED 03/08/2024 complaining of left-sided flank pain with nausea, no emesis. - Urinalysis was unremarkable without sign of infection, did have some microscopic hematuria, no leukocytosis, creatinine 0.85. CT A/P 03/08/2024 revealed 9 mm left UPJ stone with moderate left hydronephrosis with asymmetric perinephric renal edema and stranding. No other stones present. - She does complain of intermittent left-sided flank pain. She denies fevers, chills, dysuria. - No prior history of urolithiasis with no prior medical expulsive therapy or surgical intervention for stones. - Reported, unable to visualize stone on KUB 03/11/2024 with large amount of bowel gas.  PMH: HTN, prediabetes, hyperlipidemia, GAD  Past Medical History:  Diagnosis Date   Anemia    Cancer (HCC)    skin: face   Depression    Heart murmur    Hip fracture, right (HCC) 11/23/2010   History of kidney stones    Hypertension    KNEE PAIN, LEFT 11/07/2009   Mitral valve prolapse    reported by patient, does not see cardiologist   Other and unspecified hyperlipidemia 01/01/2013   PERIMENOPAUSAL SYNDROME 11/07/2009   Pre-diabetes    Rosacea 06/20/2006   Tension headache 06/20/2006        UTERINE FIBROID 06/20/2006    Past Surgical History:  Procedure Laterality Date   CATARACT EXTRACTION W/ INTRAOCULAR LENS IMPLANT Left 08/03/13   Dr. Octavia @ [facility] [place]   EYE SURGERY Right    Cataract   ORIF PATELLA Right 10/29/2019   Procedure: OPEN REDUCTION INTERNAL (ORIF) FIXATION PATELLA;  Surgeon: Addie Cordella Hamilton, MD;  Location: [facility] OR;  Service: Orthopedics;  Laterality: Right;   ORIF RADIAL FRACTURE Right 05/02/2020   Procedure: OPEN REDUCTION INTERNAL FIXATION (ORIF) RADIAL FRACTURE;  Surgeon: Elisabeth Craig RAMAN, MD;   Location: [facility];  Service: Plastics;  Laterality: Right;  90 min, please   r hip fracture and repair  January 24, 2010    Allergies: No Known Allergies  Family History  Problem Relation Age of Onset   Cancer Father    Hypertension Father    Cancer Sister    Thyroid disease Sister    Breast cancer Sister    Cancer Maternal Uncle    Diabetes Neg Hx    Heart disease Neg Hx     Social History:  reports that she has never smoked. She has never used smokeless tobacco. She reports current alcohol use of about 3.0 standard drinks of alcohol per week. She reports that she does not use drugs.  ROS: A complete review of systems was performed.  All systems are negative except for pertinent findings as noted.  Physical Exam:  Vital signs in last 24 hours: Weight:  [72.1 kg] 72.1 kg (11/24 1300) Constitutional:  Alert and oriented, No acute distress Cardiovascular: Regular rate and rhythm Respiratory: Normal respiratory effort, Lungs clear bilaterally GI: Abdomen is soft, nontender, nondistended, no abdominal masses GU: No CVA tenderness Lymphatic: No lymphadenopathy Neurologic: Grossly intact, no focal deficits Psychiatric: Normal mood and affect  Laboratory Data:  Recent Labs    03/13/24 2051  WBC 9.8  HGB 12.3  HCT 37.2  PLT 268    Recent Labs    03/13/24 2051  NA 142  K 4.1  CL 103  GLUCOSE 136*  BUN 15  CALCIUM  10.1  CREATININE 1.07*     No results found for this or any previous visit (from the past 24 hours). No results found for this or any previous visit (from the past 240 hours).  Renal Function: Recent Labs    03/13/24 2051  CREATININE 1.07*   Estimated Creatinine Clearance: 49.4 mL/min (A) (by C-G formula based on SCr of 1.07 mg/dL (H)).  Radiologic Imaging: No results found.  I independently reviewed the above imaging studies.  Assessment and Plan [name] is a 68 y.o. female with left ureteral stone here for  URS/LL.  -The risks, benefits and alternatives of cystoscopy with L URS/LL, left JJ stent placement was discussed with the patient.  Risks include, but are not limited to: bleeding, urinary tract infection, ureteral injury, ureteral stricture disease, chronic pain, urinary symptoms, bladder injury, stent migration, the need for nephrostomy tube placement, MI, CVA, DVT, PE and the inherent risks with general anesthesia.  The patient voices understanding and wishes to proceed.       Matt R. Keishon Chavarin MD 03/16/2024, 1:16 PM  [facility] Pager: [phone]): [phone]

## 2024-03-17 ENCOUNTER — Encounter (HOSPITAL_COMMUNITY): Payer: Self-pay | Admitting: Urology

## 2024-03-25 ENCOUNTER — Inpatient Hospital Stay (HOSPITAL_COMMUNITY)
Admission: EM | Admit: 2024-03-25 | Discharge: 2024-04-01 | DRG: 392 | Disposition: A | Discharge status: Home / Self Care | Attending: Internal Medicine | Admitting: Internal Medicine

## 2024-03-25 ENCOUNTER — Emergency Department (HOSPITAL_COMMUNITY)

## 2024-03-25 ENCOUNTER — Encounter (HOSPITAL_COMMUNITY): Payer: Self-pay

## 2024-03-25 ENCOUNTER — Other Ambulatory Visit: Payer: Self-pay

## 2024-03-25 DIAGNOSIS — K5720 Diverticulitis of large intestine with perforation and abscess without bleeding: Principal | ICD-10-CM | POA: Diagnosis present

## 2024-03-25 DIAGNOSIS — I10 Essential (primary) hypertension: Secondary | ICD-10-CM | POA: Diagnosis not present

## 2024-03-25 DIAGNOSIS — F324 Major depressive disorder, single episode, in partial remission: Secondary | ICD-10-CM | POA: Diagnosis not present

## 2024-03-25 DIAGNOSIS — E785 Hyperlipidemia, unspecified: Secondary | ICD-10-CM | POA: Diagnosis present

## 2024-03-25 DIAGNOSIS — E782 Mixed hyperlipidemia: Secondary | ICD-10-CM | POA: Diagnosis not present

## 2024-03-25 LAB — URINALYSIS, ROUTINE W REFLEX MICROSCOPIC
Bilirubin Urine: NEGATIVE
Glucose, UA: NEGATIVE mg/dL
Ketones, ur: 20 mg/dL — AB
Nitrite: NEGATIVE
Protein, ur: >=300 mg/dL — AB
RBC / HPF: >50 RBC/hpf (ref 0–5)
Specific Gravity, Urine: 1.022 (ref 1.005–1.030)
pH: 6 (ref 5.0–8.0)

## 2024-03-25 LAB — CBC
HCT: 33.8 % — ABNORMAL LOW (ref 36.0–46.0)
Hemoglobin: 11.2 g/dL — ABNORMAL LOW (ref 12.0–15.0)
MCH: 32.3 pg (ref 26.0–34.0)
MCHC: 33.1 g/dL (ref 30.0–36.0)
MCV: 97.4 fL (ref 80.0–100.0)
Platelets: 345 K/uL (ref 150–400)
RBC: 3.47 MIL/uL — ABNORMAL LOW (ref 3.87–5.11)
RDW: 12.7 % (ref 11.5–15.5)
WBC: 18.6 K/uL — ABNORMAL HIGH (ref 4.0–10.5)
nRBC: 0 % (ref 0.0–0.2)

## 2024-03-25 LAB — COMPREHENSIVE METABOLIC PANEL WITH GFR
ALT: 27 U/L (ref 0–44)
AST: 26 U/L (ref 15–41)
Albumin: 3.7 g/dL (ref 3.5–5.0)
Alkaline Phosphatase: 135 U/L — ABNORMAL HIGH (ref 38–126)
Anion gap: 13 (ref 5–15)
BUN: 17 mg/dL (ref 8–23)
CO2: 22 mmol/L (ref 22–32)
Calcium: 9.1 mg/dL (ref 8.9–10.3)
Chloride: 101 mmol/L (ref 98–111)
Creatinine, Ser: 0.77 mg/dL (ref 0.44–1.00)
GFR, Estimated: >60 mL/min (ref >60)
Glucose, Bld: 124 mg/dL — ABNORMAL HIGH (ref 70–99)
Potassium: 4.2 mmol/L (ref 3.5–5.1)
Sodium: 136 mmol/L (ref 135–145)
Total Bilirubin: 0.8 mg/dL (ref 0.0–1.2)
Total Protein: 7.1 g/dL (ref 6.5–8.1)

## 2024-03-25 LAB — I-STAT CG4 LACTIC ACID, ED
Lactic Acid, Venous: 1 mmol/L (ref 0.5–1.9)
Lactic Acid, Venous: 1.1 mmol/L (ref 0.5–1.9)

## 2024-03-25 LAB — LIPASE, BLOOD: Lipase: <10 U/L — ABNORMAL LOW (ref 11–51)

## 2024-03-25 MED ORDER — LACTATED RINGERS IV SOLN: INTRAVENOUS | Status: AC

## 2024-03-25 MED ORDER — PIPERACILLIN-TAZOBACTAM 3.375 G IVPB
3.3750 g | Freq: Three times a day (TID) | INTRAVENOUS | Status: DC
  Administered 2024-03-25 – 2024-04-01 (×20): 3.375 g via INTRAVENOUS
  Filled 2024-03-25 (×19): qty 50

## 2024-03-25 MED ORDER — SODIUM CHLORIDE 0.9% FLUSH
3.0000 mL | Freq: Two times a day (BID) | INTRAVENOUS | Status: DC
  Administered 2024-03-25 – 2024-03-31 (×8): 3 mL via INTRAVENOUS

## 2024-03-25 MED ORDER — HYDRALAZINE HCL 20 MG/ML IJ SOLN: 10.0000 mg | INTRAMUSCULAR | Status: DC | PRN

## 2024-03-25 MED ORDER — ONDANSETRON HCL 4 MG/2ML IJ SOLN
4.0000 mg | Freq: Once | INTRAMUSCULAR | Status: AC
  Administered 2024-03-25: 4 mg via INTRAVENOUS
  Filled 2024-03-25: qty 2

## 2024-03-25 MED ORDER — MORPHINE SULFATE (PF) 2 MG/ML IV SOLN
2.0000 mg | INTRAVENOUS | Status: DC | PRN
  Administered 2024-03-28: 1 mg via INTRAVENOUS
  Filled 2024-03-25: qty 1

## 2024-03-25 MED ORDER — LACTATED RINGERS IV BOLUS
1000.0000 mL | Freq: Once | INTRAVENOUS | Status: AC
  Administered 2024-03-25: 1000 mL via INTRAVENOUS

## 2024-03-25 MED ORDER — ACETAMINOPHEN 650 MG RE SUPP: 650.0000 mg | Freq: Four times a day (QID) | RECTAL | Status: DC | PRN

## 2024-03-25 MED ORDER — KETOROLAC TROMETHAMINE 15 MG/ML IJ SOLN
15.0000 mg | Freq: Once | INTRAMUSCULAR | Status: AC
  Administered 2024-03-25: 15 mg via INTRAVENOUS
  Filled 2024-03-25: qty 1

## 2024-03-25 MED ORDER — ONDANSETRON HCL 4 MG/2ML IJ SOLN
4.0000 mg | Freq: Four times a day (QID) | INTRAMUSCULAR | Status: DC | PRN
  Administered 2024-03-28: 4 mg via INTRAVENOUS
  Filled 2024-03-25: qty 2

## 2024-03-25 NOTE — ED Provider Triage Note (Signed)
 Emergency Medicine Provider Triage Evaluation Note  April Castillo , a 68 y.o. female  was evaluated in triage.  Pt complains of bilateral flank pain, abd cramping, nausea since having stent placed. Has had 2 days of abx but unable to swallow them d/t size [facility] urology office who recommends ED evaluation for pain, nausea. Was supposed to have urethral stent out this upcoming monday  Review of Systems  Positive: See hgi Negative:   Physical Exam  BP 130/71 (BP Location: Right Arm)   Pulse (!) 104   Temp 99.8 F (37.7 C) (Oral)   Resp 16   Ht 5' 5 (1.651 m)   Wt 69.9 kg   SpO2 95%   BMI 25.63 kg/m  Gen:   Awake, no distress   Resp:  Normal effort MSK:   Moves extremities without difficulty  Other:    Medical Decision Making  Medically screening exam initiated at 3:02 PM.  Appropriate orders placed.  April Castillo was informed that the remainder of the evaluation will be completed by another provider, this initial triage assessment does not replace that evaluation, and the importance of remaining in the ED until their evaluation is complete.  Labs ordered. Toradol  ordered for pain   April Castillo, April Castillo 03/25/24 1507

## 2024-03-25 NOTE — ED Provider Notes (Signed)
 [place] EMERGENCY DEPARTMENT AT [facility] Provider Note   CSN: [id number] Arrival date & time: 03/25/24  1416     Patient presents with: Abdominal Pain   April Castillo is a 68 y.o. female.   68 year old female with recent ureteral stone requiring stenting 2 weeks ago presenting to the emergency department today with left-sided abdominal discomfort and bloating.  The patient states that she saw her urologist last week and was supposed to have the stent removed but there was some concern for urine infection so this was left in.  The patient states that she has been on oral antibiotics but states that she has had a lot of nausea with this so has only taken a few doses of this.  She has not had any vomiting.  Reports that she has been having very minimal loose stools.  She came to the ER today for further evaluation.  She denies any fevers but has had some chills.  The patient does have a significant leukocytosis.  The rest of her labs are largely unremarkable.  CT scan does show perforated diverticulitis.  The patient is covered with Zosyn .  A call is placed to surgery and medicine for admission.   Abdominal Pain      Prior to Admission medications   Medication Sig Start Date End Date Taking? Authorizing Provider  aspirin  EC 81 MG tablet Take 81 mg by mouth daily. Swallow whole.    [provider]  Calcium  Carb-Cholecalciferol (CALCIUM  + VITAMIN D3 PO) Take 1 tablet by mouth daily.    [provider]  carboxymethylcellulose 1 % ophthalmic solution Place 1 drop into both eyes daily as needed (Moisturing eye).    [provider]  DENOSUMAB  Inject into the skin every 6 (six) months.    [provider]  FLUoxetine  (PROZAC ) 20 MG capsule Take 1 capsule (20 mg total) by mouth daily. 01/15/18   Benjamine Hamilton, DO  lisinopril  (PRINIVIL ,ZESTRIL ) 10 MG tablet TAKE 1 TABLET DAILY 10/03/17   Prentiss Dorothyann Maxwell, MD  Multiple Vitamin  (MULTIVITAMIN) capsule Take 1 capsule by mouth daily.    [provider]  NON FORMULARY DIET: REGULAR, NAS, HEART HEALTHY 11/06/19   [provider]  ondansetron  (ZOFRAN -ODT) 4 MG disintegrating tablet Take 1 tablet (4 mg total) by mouth every 8 (eight) hours as needed for nausea or vomiting. 03/13/24   Geiple, Joshua, PA-C  oxyCODONE -acetaminophen  (PERCOCET/ROXICET) 5-325 MG tablet Take 1-2 tablets by mouth every 6 (six) hours as needed for up to 18 doses for severe pain (pain score 7-10). 03/16/24   Selma Donnice SAUNDERS, MD  rosuvastatin  (CRESTOR ) 20 MG tablet Take 20 mg by mouth daily.    [provider]  tamsulosin  (FLOMAX ) 0.4 MG CAPS capsule Take 1 capsule (0.4 mg total) by mouth 2 (two) times daily. Patient taking differently: Take 0.4 mg by mouth 2 (two) times daily as needed (stones). 03/08/24   Vicky Charleston, PA-C    Allergies: Latex    Review of Systems  Gastrointestinal:  Positive for abdominal pain.  All other systems reviewed and are negative.   Updated Vital Signs BP (!) 117/55 (BP Location: Right Arm)   Pulse 82   Temp 98.1 F (36.7 C) (Oral)   Resp 14   Ht 5' 5 (1.651 m)   Wt 69.9 kg   SpO2 99%   BMI 25.63 kg/m   Physical Exam Vitals and nursing note reviewed.   Gen: NAD Eyes: PERRL, EOMI HEENT: no  oropharyngeal swelling Neck: trachea midline Resp: clear to auscultation bilaterally Card: Tachycardic, no murmurs, rubs, or gallops Abd: Patient is tender over the left lower quadrant with some left-sided CVA tenderness noted Extremities: no calf tenderness, no edema Vascular: 2+ radial pulses bilaterally, 2+ DP pulses bilaterally Skin: no rashes Psyc: acting appropriately    (all labs ordered are listed, but only abnormal results are displayed) Labs Reviewed  LIPASE, BLOOD - Abnormal; Notable for the following components:      Result Value   Lipase <10 (*)    All other components within normal limits  COMPREHENSIVE METABOLIC  PANEL WITH GFR - Abnormal; Notable for the following components:   Glucose, Bld 124 (*)    Alkaline Phosphatase 135 (*)    All other components within normal limits  CBC - Abnormal; Notable for the following components:   WBC 18.6 (*)    RBC 3.47 (*)    Hemoglobin 11.2 (*)    HCT 33.8 (*)    All other components within normal limits  URINALYSIS, ROUTINE W REFLEX MICROSCOPIC - Abnormal; Notable for the following components:   APPearance CLOUDY (*)    Hgb urine dipstick LARGE (*)    Ketones, ur 20 (*)    Protein, ur >=300 (*)    Leukocytes,Ua MODERATE (*)    Bacteria, UA RARE (*)    All other components within normal limits  I-STAT CG4 LACTIC ACID, ED  I-STAT CG4 LACTIC ACID, ED    EKG: None  Radiology: CT ABDOMEN PELVIS WO CONTRAST Result Date: 03/25/2024 EXAM: CT ABDOMEN AND PELVIS WITHOUT CONTRAST 03/25/2024 08:10:27 PM TECHNIQUE: CT of the abdomen and pelvis was performed without the administration of intravenous contrast. Multiplanar reformatted images are provided for review. Automated exposure control, iterative reconstruction, and/or weight-based adjustment of the mA/kV was utilized to reduce the radiation dose to as low as reasonably achievable. COMPARISON: Comparison with the 03/08/2024. CLINICAL HISTORY: Abdominal pain, acute, nonlocalized. FINDINGS: LOWER CHEST: No acute abnormality. LIVER: The liver is unremarkable. GALLBLADDER AND BILE DUCTS: Cholelithiasis without acute cholecystitis. No biliary ductal dilatation. SPLEEN: No acute abnormality. PANCREAS: No acute abnormality. ADRENAL GLANDS: No acute abnormality. KIDNEYS, URETERS AND BLADDER: Left ureteral stent. No stones in the kidneys or ureters. No hydronephrosis. No perinephric or periureteral stranding. Urinary bladder is unremarkable. GI AND BOWEL: Stomach demonstrates no acute abnormality. Wall thickening about loops of small bowel in the low pelvis. Extensive mesenteric edema and vascular engorgement surrounding a  focus of gas extraluminal measuring 3 x 2 cm (series 2 image 58). This is favored to arise from a perforated sigmoid diverticulum (series 8 image 60). There is no bowel obstruction. PERITONEUM AND RETROPERITONEUM: No ascites. No free air. VASCULATURE: Aorta is normal in caliber. LYMPH NODES: No lymphadenopathy. REPRODUCTIVE ORGANS: Fibroid uterus. BONES AND SOFT TISSUES: Screw fixation of the right femoral neck. No acute osseous abnormality. No focal soft tissue abnormality. IMPRESSION: 1. Perforated sigmoid diverticulitis with contained perforation in the low anterior pelvis. 2. Wall thickening about loops of small bowel in the low pelvis is favored reactive secondary to perforated sigmoid diverticulitis. 3. Resolution of the previous hydronephrosis after placement of a left ureteral stent. Critical value/emergent results were called by telephone at the time of interpretation on 03/25/24 @ 9:10 PM to Dr. Ula, who verbally acknowledged these results. Electronically signed by: Norman Gatlin MD 03/25/2024 09:13 PM EST RP Workstation: [id number]     Procedures   Medications Ordered in the ED  ketorolac  (TORADOL ) 15 MG/ML injection 15 mg (  15 mg Intravenous Given 03/25/24 1529)  ondansetron  (ZOFRAN ) injection 4 mg (4 mg Intravenous Given 03/25/24 1528)  lactated ringers  bolus 1,000 mL (0 mLs Intravenous Stopped 03/25/24 2115)                                    Medical Decision Making 68 year old female with past medical history of ureterolithiasis with infection presenting to the emergency department today with left-sided abdominal point pain.  May be due to her stent but given the worsening symptoms will obtain CT scan to for diverticulitis, colitis, stent migration or malfunction, or other intra-abdominal pathology.  Will obtain basic labs here and give patient IV fluids as she is mildly tachycardic and borderline febrile here.  Will hold off on antibiotics until her workup is complete as his symptoms may  be due to pain as well.  I will reevaluate for ultimate disposition.  Amount and/or Complexity of Data Reviewed Labs: ordered. Radiology: ordered.        Final diagnoses:  Diverticulitis of colon with perforation    ED Discharge Orders     None          Ula Prentice SAUNDERS, MD 03/25/24 2116

## 2024-03-25 NOTE — ED Triage Notes (Signed)
 Patient had a stent put in her left kidney last week. Has abdominal cramping ever since surgery. Feels nauseous and has diarrhea. Had the stent put in longer due to an infection.

## 2024-03-25 NOTE — Hospital Course (Signed)
 April Castillo is a 68 y.o. female with medical history significant for HTN, HLD, depression, nephrolithiasis s/p left ureteral stenting (03/16/2024) who is admitted with sigmoid diverticulitis with contained perforation.

## 2024-03-25 NOTE — Consult Note (Signed)
 CC: abd pain  Requesting provider: Dr Ula  HPI: April Castillo is an 68 y.o. female who is here for left sided abdominal pain, bloating and loose stools.  She has a h/o ureteral stenting ~2 wks ago for obstructing calculus.  She developed new LLQ pain a couple days ago and presented to the ED with worsening symptoms.  CT scan shows diverticulitis with perforation.  Wbc elevated  Past Medical History:  Diagnosis Date   Anemia    Cancer (HCC)    skin: face   Depression    Heart murmur    Hip fracture, right (HCC) 11/23/2010   History of kidney stones    Hypertension    KNEE PAIN, LEFT 11/07/2009   Mitral valve prolapse    reported by patient, does not see cardiologist   Other and unspecified hyperlipidemia 01/01/2013   PERIMENOPAUSAL SYNDROME 11/07/2009   Pre-diabetes    Rosacea 06/20/2006   Tension headache 06/20/2006        UTERINE FIBROID 06/20/2006    Past Surgical History:  Procedure Laterality Date   CATARACT EXTRACTION W/ INTRAOCULAR LENS IMPLANT Left 08/03/13   Dr. Octavia @ [facility] [place]   CYSTOSCOPY W/ RETROGRADES Left 03/16/2024   Procedure: CYSTOSCOPY, WITH RETROGRADE PYELOGRAM;  Surgeon: Selma Donnice SAUNDERS, MD;  Location: [facility];  Service: Urology;  Laterality: Left;   CYSTOSCOPY/URETEROSCOPY/HOLMIUM LASER/STENT PLACEMENT Left 03/16/2024   Procedure: CYSTOSCOPY/URETEROSCOPY/HOLMIUM LASER/STENT PLACEMENT;  Surgeon: Selma Donnice SAUNDERS, MD;  Location: [facility];  Service: Urology;  Laterality: Left;   EYE SURGERY Right    Cataract   ORIF PATELLA Right 10/29/2019   Procedure: OPEN REDUCTION INTERNAL (ORIF) FIXATION PATELLA;  Surgeon: Addie Cordella Hamilton, MD;  Location: [facility] OR;  Service: Orthopedics;  Laterality: Right;   ORIF RADIAL FRACTURE Right 05/02/2020   Procedure: OPEN REDUCTION INTERNAL FIXATION (ORIF) RADIAL FRACTURE;  Surgeon: Elisabeth Craig RAMAN, MD;  Location: [facility];  Service: Plastics;  Laterality: Right;  90 min, please   r hip fracture and repair   January 24, 2010    Family History  Problem Relation Age of Onset   Cancer Father    Hypertension Father    Cancer Sister    Thyroid disease Sister    Breast cancer Sister    Cancer Maternal Uncle    Diabetes Neg Hx    Heart disease Neg Hx     Social:  reports that she has never smoked. She has never used smokeless tobacco. She reports that she does not currently use alcohol after a past usage of about 3.0 standard drinks of alcohol per week. She reports that she does not use drugs.  Allergies:  Allergies  Allergen Reactions   Latex Rash    Medications: I have reviewed the patient's current medications.  Results for orders placed or performed during the hospital encounter of 03/25/24 (from the past 48 hours)  Lipase, blood     Status: Abnormal   Collection Time: 03/25/24  3:32 PM  Result Value Ref Range   Lipase <10 (L) 11 - 51 U/L    Comment: Performed at [facility], [address], [place], [place] [address]  Comprehensive metabolic panel     Status: Abnormal   Collection Time: 03/25/24  3:32 PM  Result Value Ref Range   Sodium 136 135 - 145 mmol/L   Potassium 4.2 3.5 - 5.1 mmol/L   Chloride 101 98 - 111 mmol/L   CO2 22 22 - 32  mmol/L   Glucose, Bld 124 (H) 70 - 99 mg/dL    Comment: Glucose reference range applies only to samples taken after fasting for at least 8 hours.   BUN 17 8 - 23 mg/dL   Creatinine, Ser 9.22 0.44 - 1.00 mg/dL   Calcium  9.1 8.9 - 10.3 mg/dL   Total Protein 7.1 6.5 - 8.1 g/dL   Albumin 3.7 3.5 - 5.0 g/dL   AST 26 15 - 41 U/L   ALT 27 0 - 44 U/L   Alkaline Phosphatase 135 (H) 38 - 126 U/L   Total Bilirubin 0.8 0.0 - 1.2 mg/dL   GFR, Estimated >39 >39 mL/min    Comment: (NOTE) Calculated using the CKD-EPI Creatinine Equation (2021)    Anion gap 13 5 - 15    Comment: Performed at [facility], [address] [place], [place] [address]  CBC     Status: Abnormal   Collection Time: 03/25/24  3:32 PM   Result Value Ref Range   WBC 18.6 (H) 4.0 - 10.5 K/uL   RBC 3.47 (L) 3.87 - 5.11 MIL/uL   Hemoglobin 11.2 (L) 12.0 - 15.0 g/dL   HCT 66.1 (L) 63.9 - 53.9 %   MCV 97.4 80.0 - 100.0 fL   MCH 32.3 26.0 - 34.0 pg   MCHC 33.1 30.0 - 36.0 g/dL   RDW 87.2 88.4 - 84.4 %   Platelets 345 150 - 400 K/uL   nRBC 0.0 0.0 - 0.2 %    Comment: Performed at [facility], [address] [place], [place] [address]  I-Stat CG4 Lactic Acid     Status: None   Collection Time: 03/25/24  3:38 PM  Result Value Ref Range   Lactic Acid, Venous 1.0 0.5 - 1.9 mmol/L  Urinalysis, Routine w reflex microscopic -Urine, Clean Catch     Status: Abnormal   Collection Time: 03/25/24  4:29 PM  Result Value Ref Range   Color, Urine YELLOW YELLOW   APPearance CLOUDY (A) CLEAR   Specific Gravity, Urine 1.022 1.005 - 1.030   pH 6.0 5.0 - 8.0   Glucose, UA NEGATIVE NEGATIVE mg/dL   Hgb urine dipstick LARGE (A) NEGATIVE   Bilirubin Urine NEGATIVE NEGATIVE   Ketones, ur 20 (A) NEGATIVE mg/dL   Protein, ur >=699 (A) NEGATIVE mg/dL   Nitrite NEGATIVE NEGATIVE   Leukocytes,Ua MODERATE (A) NEGATIVE   RBC / HPF >50 0 - 5 RBC/hpf   WBC, UA 21-50 0 - 5 WBC/hpf   Bacteria, UA RARE (A) NONE SEEN   Squamous Epithelial / HPF 0-5 0 - 5 /HPF   Mucus PRESENT     Comment: Performed at [facility], [address], [place], [place] [address]  I-Stat CG4 Lactic Acid     Status: None   Collection Time: 03/25/24  5:47 PM  Result Value Ref Range   Lactic Acid, Venous 1.1 0.5 - 1.9 mmol/L    CT ABDOMEN PELVIS WO CONTRAST Result Date: 03/25/2024 EXAM: CT ABDOMEN AND PELVIS WITHOUT CONTRAST 03/25/2024 08:10:27 PM TECHNIQUE: CT of the abdomen and pelvis was performed without the administration of intravenous contrast. Multiplanar reformatted images are provided for review. Automated exposure control, iterative reconstruction, and/or weight-based adjustment of the mA/kV was utilized to reduce the  radiation dose to as low as reasonably achievable. COMPARISON: Comparison with the 03/08/2024. CLINICAL HISTORY: Abdominal pain, acute, nonlocalized. FINDINGS: LOWER CHEST: No acute abnormality. LIVER: The liver is unremarkable. GALLBLADDER AND BILE  DUCTS: Cholelithiasis without acute cholecystitis. No biliary ductal dilatation. SPLEEN: No acute abnormality. PANCREAS: No acute abnormality. ADRENAL GLANDS: No acute abnormality. KIDNEYS, URETERS AND BLADDER: Left ureteral stent. No stones in the kidneys or ureters. No hydronephrosis. No perinephric or periureteral stranding. Urinary bladder is unremarkable. GI AND BOWEL: Stomach demonstrates no acute abnormality. Wall thickening about loops of small bowel in the low pelvis. Extensive mesenteric edema and vascular engorgement surrounding a focus of gas extraluminal measuring 3 x 2 cm (series 2 image 58). This is favored to arise from a perforated sigmoid diverticulum (series 8 image 60). There is no bowel obstruction. PERITONEUM AND RETROPERITONEUM: No ascites. No free air. VASCULATURE: Aorta is normal in caliber. LYMPH NODES: No lymphadenopathy. REPRODUCTIVE ORGANS: Fibroid uterus. BONES AND SOFT TISSUES: Screw fixation of the right femoral neck. No acute osseous abnormality. No focal soft tissue abnormality. IMPRESSION: 1. Perforated sigmoid diverticulitis with contained perforation in the low anterior pelvis. 2. Wall thickening about loops of small bowel in the low pelvis is favored reactive secondary to perforated sigmoid diverticulitis. 3. Resolution of the previous hydronephrosis after placement of a left ureteral stent. Critical value/emergent results were called by telephone at the time of interpretation on 03/25/24 @ 9:10 PM to Dr. Ula, who verbally acknowledged these results. Electronically signed by: Norman Gatlin MD 03/25/2024 09:13 PM EST RP Workstation: [id number]    ROS - all of the below systems have been reviewed with the patient and positives are  indicated with bold text General: chills, fever or night sweats Eyes: blurry vision or double vision ENT: epistaxis or sore throat Hematologic/Lymphatic: bleeding problems, blood clots or swollen lymph nodes Endocrine: temperature intolerance or unexpected weight changes Breast: new or changing breast lumps or nipple discharge Resp: cough, shortness of breath, or wheezing CV: chest pain or dyspnea on exertion GI: as per HPI GU: dysuria, trouble voiding, or hematuria Neuro: TIA or stroke symptoms    PE Blood pressure (!) 117/55, pulse 82, temperature 98.1 F (36.7 C), temperature source Oral, resp. rate 14, height 5' 5 (1.651 m), weight 69.9 kg, SpO2 99%. Constitutional: NAD; conversant; no deformities Eyes: Moist conjunctiva; no lid lag; anicteric; PERRL Neck: Trachea midline; no thyromegaly Lungs: Normal respiratory effort CV: RRR GI: Abd TTP LLQ MSK: Normal range of motion of extremities; no clubbing/cyanosis Psychiatric: Appropriate affect; alert and oriented x3  Results for orders placed or performed during the hospital encounter of 03/25/24 (from the past 48 hours)  Lipase, blood     Status: Abnormal   Collection Time: 03/25/24  3:32 PM  Result Value Ref Range   Lipase <10 (L) 11 - 51 U/L    Comment: Performed at [facility], [address] [place], [place] [address]  Comprehensive metabolic panel     Status: Abnormal   Collection Time: 03/25/24  3:32 PM  Result Value Ref Range   Sodium 136 135 - 145 mmol/L   Potassium 4.2 3.5 - 5.1 mmol/L   Chloride 101 98 - 111 mmol/L   CO2 22 22 - 32 mmol/L   Glucose, Bld 124 (H) 70 - 99 mg/dL    Comment: Glucose reference range applies only to samples taken after fasting for at least 8 hours.   BUN 17 8 - 23 mg/dL   Creatinine, Ser 9.22 0.44 - 1.00 mg/dL   Calcium  9.1 8.9 - 10.3 mg/dL   Total Protein 7.1 6.5 - 8.1 g/dL   Albumin 3.7 3.5 - 5.0 g/dL   AST 26 15 -  41 U/L   ALT 27 0 - 44 U/L   Alkaline  Phosphatase 135 (H) 38 - 126 U/L   Total Bilirubin 0.8 0.0 - 1.2 mg/dL   GFR, Estimated >39 >39 mL/min    Comment: (NOTE) Calculated using the CKD-EPI Creatinine Equation (2021)    Anion gap 13 5 - 15    Comment: Performed at [facility], [address] [place], [place] [address]  CBC     Status: Abnormal   Collection Time: 03/25/24  3:32 PM  Result Value Ref Range   WBC 18.6 (H) 4.0 - 10.5 K/uL   RBC 3.47 (L) 3.87 - 5.11 MIL/uL   Hemoglobin 11.2 (L) 12.0 - 15.0 g/dL   HCT 66.1 (L) 63.9 - 53.9 %   MCV 97.4 80.0 - 100.0 fL   MCH 32.3 26.0 - 34.0 pg   MCHC 33.1 30.0 - 36.0 g/dL   RDW 87.2 88.4 - 84.4 %   Platelets 345 150 - 400 K/uL   nRBC 0.0 0.0 - 0.2 %    Comment: Performed at [facility], [address] [place], [place] [address]  I-Stat CG4 Lactic Acid     Status: None   Collection Time: 03/25/24  3:38 PM  Result Value Ref Range   Lactic Acid, Venous 1.0 0.5 - 1.9 mmol/L  Urinalysis, Routine w reflex microscopic -Urine, Clean Catch     Status: Abnormal   Collection Time: 03/25/24  4:29 PM  Result Value Ref Range   Color, Urine YELLOW YELLOW   APPearance CLOUDY (A) CLEAR   Specific Gravity, Urine 1.022 1.005 - 1.030   pH 6.0 5.0 - 8.0   Glucose, UA NEGATIVE NEGATIVE mg/dL   Hgb urine dipstick LARGE (A) NEGATIVE   Bilirubin Urine NEGATIVE NEGATIVE   Ketones, ur 20 (A) NEGATIVE mg/dL   Protein, ur >=699 (A) NEGATIVE mg/dL   Nitrite NEGATIVE NEGATIVE   Leukocytes,Ua MODERATE (A) NEGATIVE   RBC / HPF >50 0 - 5 RBC/hpf   WBC, UA 21-50 0 - 5 WBC/hpf   Bacteria, UA RARE (A) NONE SEEN   Squamous Epithelial / HPF 0-5 0 - 5 /HPF   Mucus PRESENT     Comment: Performed at [facility], [address] [place], [place] [address]  I-Stat CG4 Lactic Acid     Status: None   Collection Time: 03/25/24  5:47 PM  Result Value Ref Range   Lactic Acid, Venous 1.1 0.5 - 1.9 mmol/L    CT ABDOMEN PELVIS WO CONTRAST Result  Date: 03/25/2024 EXAM: CT ABDOMEN AND PELVIS WITHOUT CONTRAST 03/25/2024 08:10:27 PM TECHNIQUE: CT of the abdomen and pelvis was performed without the administration of intravenous contrast. Multiplanar reformatted images are provided for review. Automated exposure control, iterative reconstruction, and/or weight-based adjustment of the mA/kV was utilized to reduce the radiation dose to as low as reasonably achievable. COMPARISON: Comparison with the 03/08/2024. CLINICAL HISTORY: Abdominal pain, acute, nonlocalized. FINDINGS: LOWER CHEST: No acute abnormality. LIVER: The liver is unremarkable. GALLBLADDER AND BILE DUCTS: Cholelithiasis without acute cholecystitis. No biliary ductal dilatation. SPLEEN: No acute abnormality. PANCREAS: No acute abnormality. ADRENAL GLANDS: No acute abnormality. KIDNEYS, URETERS AND BLADDER: Left ureteral stent. No stones in the kidneys or ureters. No hydronephrosis. No perinephric or periureteral stranding. Urinary bladder is unremarkable. GI AND BOWEL: Stomach demonstrates no acute abnormality. Wall thickening about loops of small bowel in the low pelvis. Extensive mesenteric edema and vascular engorgement surrounding a focus of gas extraluminal  measuring 3 x 2 cm (series 2 image 58). This is favored to arise from a perforated sigmoid diverticulum (series 8 image 60). There is no bowel obstruction. PERITONEUM AND RETROPERITONEUM: No ascites. No free air. VASCULATURE: Aorta is normal in caliber. LYMPH NODES: No lymphadenopathy. REPRODUCTIVE ORGANS: Fibroid uterus. BONES AND SOFT TISSUES: Screw fixation of the right femoral neck. No acute osseous abnormality. No focal soft tissue abnormality. IMPRESSION: 1. Perforated sigmoid diverticulitis with contained perforation in the low anterior pelvis. 2. Wall thickening about loops of small bowel in the low pelvis is favored reactive secondary to perforated sigmoid diverticulitis. 3. Resolution of the previous hydronephrosis after placement of  a left ureteral stent. Critical value/emergent results were called by telephone at the time of interpretation on 03/25/24 @ 9:10 PM to Dr. Ula, who verbally acknowledged these results. Electronically signed by: Norman Gatlin MD 03/25/2024 09:13 PM EST RP Workstation: [id number]     A/P: April Castillo is an 68 y.o. female with perforated diverticulitis.  She is hemodynamically stable.  Will start IV antibiotics and keep NPO.  Monitor for signs of resolution and progressive abscess.      Bernarda JAYSON Ned, MD  Colorectal and General Surgery [facility] Surgery   high medical decision making.

## 2024-03-25 NOTE — H&P (Signed)
 History and Physical    April Castillo FMW:[id number] DOB: Aug 01, 1955 DOA: 03/25/2024  PCP: Cesario Mutton, MD  Patient coming from: Home  I have personally briefly reviewed patient's old medical records in [facility]  Chief Complaint: Abdominal pain  HPI: April Castillo is a 68 y.o. female with medical history significant for HTN, HLD, depression, nephrolithiasis s/p left ureteral stenting (03/16/2024) who presented to the ED for evaluation of abdominal pain.  Patient recently underwent left ureteral stenting for nephrolithiasis on 03/16/2024.  Patient states that since then she has had persistent abdominal pain across her lower abdomen.  She had follow-up with her urologist earlier in the week for possible stent removal however this was not performed due to concern she had a urinary tract infection.  She was given a prescription for Bactrim .  She states she was only able to take a couple doses due to nausea.  Patient states that she had worsening of her lower abdominal pain over the last 24 hours.  She has had a small volume bowel movement earlier today.  She has not had fevers or diaphoresis but reports some chills.  ED Course  Labs/Imaging on admission: I have personally reviewed following labs and imaging studies.  Initial vitals showed BP 130/71, pulse 104, RR 16, temp 99.8 F, SpO2 95% room air.  Labs showed WBC 18.6, hemoglobin 11.2, platelets 345, sodium 136, potassium 4.2, bicarb 22, BUN 17, creatinine 0.77, serum glucose 124, alk phos 135 otherwise LFTs within normal limits, lipase <10.  Lactic acid 1.0.  CT abdomen/pelvis without contrast showed perforated sigmoid diverticulitis with contained perforation and low anterior pelvis.  Wall thickening about loops of small bowel in the lower pelvis is favored reactive secondary to perforated sigmoid diverticulitis.  Resolution of the previous hydronephrosis after placement of a left ureteral stent noted.  Patient was given 1 L LR,  IV Zosyn, IV Toradol , IV Zofran .  EDP discussed with general surgery who will see in consultation.  The hospitalist service was consulted for admission.  Review of Systems: All systems reviewed and are negative except as documented in history of present illness above.   Past Medical History:  Diagnosis Date   Anemia    Cancer (HCC)    skin: face   Depression    Heart murmur    Hip fracture, right (HCC) 11/23/2010   History of kidney stones    Hypertension    KNEE PAIN, LEFT 11/07/2009   Mitral valve prolapse    reported by patient, does not see cardiologist   Other and unspecified hyperlipidemia 01/01/2013   PERIMENOPAUSAL SYNDROME 11/07/2009   Pre-diabetes    Rosacea 06/20/2006   Tension headache 06/20/2006        UTERINE FIBROID 06/20/2006    Past Surgical History:  Procedure Laterality Date   CATARACT EXTRACTION W/ INTRAOCULAR LENS IMPLANT Left 08/03/13   Dr. Octavia @ [place] [place]   CYSTOSCOPY W/ RETROGRADES Left 03/16/2024   Procedure: CYSTOSCOPY, WITH RETROGRADE PYELOGRAM;  Surgeon: Selma Donnice SAUNDERS, MD;  Location: [facility];  Service: Urology;  Laterality: Left;   CYSTOSCOPY/URETEROSCOPY/HOLMIUM LASER/STENT PLACEMENT Left 03/16/2024   Procedure: CYSTOSCOPY/URETEROSCOPY/HOLMIUM LASER/STENT PLACEMENT;  Surgeon: Selma Donnice SAUNDERS, MD;  Location: [facility];  Service: Urology;  Laterality: Left;   EYE SURGERY Right    Cataract   ORIF PATELLA Right 10/29/2019   Procedure: OPEN REDUCTION INTERNAL (ORIF) FIXATION PATELLA;  Surgeon: Addie Cordella Hamilton, MD;  Location: [facility] OR;  Service: Orthopedics;  Laterality: Right;  ORIF RADIAL FRACTURE Right 05/02/2020   Procedure: OPEN REDUCTION INTERNAL FIXATION (ORIF) RADIAL FRACTURE;  Surgeon: Elisabeth Craig RAMAN, MD;  Location: [facility];  Service: Plastics;  Laterality: Right;  90 min, please   r hip fracture and repair  January 24, 2010    Social History: Social History   Tobacco Use   Smoking status: Never   Smokeless tobacco: Never   Vaping Use   Vaping status: Never Used  Substance Use Topics   Alcohol use: Not Currently    Alcohol/week: 3.0 standard drinks of alcohol    Types: 1 Glasses of wine, 2 Cans of beer per week   Drug use: No   Allergies  Allergen Reactions   Latex Rash    Family History  Problem Relation Age of Onset   Cancer Father    Hypertension Father    Cancer Sister    Thyroid disease Sister    Breast cancer Sister    Cancer Maternal Uncle    Diabetes Neg Hx    Heart disease Neg Hx      Prior to Admission medications   Medication Sig Start Date End Date Taking? Authorizing Provider  aspirin  EC 81 MG tablet Take 81 mg by mouth daily. Swallow whole.    [provider]  Calcium  Carb-Cholecalciferol (CALCIUM  + VITAMIN D3 PO) Take 1 tablet by mouth daily.    [provider]  carboxymethylcellulose 1 % ophthalmic solution Place 1 drop into both eyes daily as needed (Moisturing eye).    [provider]  DENOSUMAB [place] Inject into the skin every 6 (six) months.    [provider]  FLUoxetine  (PROZAC ) 20 MG capsule Take 1 capsule (20 mg total) by mouth daily. 01/15/18   Benjamine Hamilton, DO  lisinopril  (PRINIVIL ,ZESTRIL ) 10 MG tablet TAKE 1 TABLET DAILY 10/03/17   Prentiss Dorothyann Maxwell, MD  Multiple Vitamin (MULTIVITAMIN) capsule Take 1 capsule by mouth daily.    [provider]  NON FORMULARY DIET: REGULAR, NAS, HEART HEALTHY 11/06/19   [provider]  ondansetron  (ZOFRAN -ODT) 4 MG disintegrating tablet Take 1 tablet (4 mg total) by mouth every 8 (eight) hours as needed for nausea or vomiting. 03/13/24   Geiple, Joshua, PA-C  oxyCODONE -acetaminophen  (PERCOCET/ROXICET) 5-325 MG tablet Take 1-2 tablets by mouth every 6 (six) hours as needed for up to 18 doses for severe pain (pain score 7-10). 03/16/24   Selma Donnice SAUNDERS, MD  rosuvastatin  (CRESTOR ) 20 MG tablet Take 20 mg by mouth daily.    [provider]  tamsulosin  (FLOMAX ) 0.4 MG CAPS  capsule Take 1 capsule (0.4 mg total) by mouth 2 (two) times daily. Patient taking differently: Take 0.4 mg by mouth 2 (two) times daily as needed (stones). 03/08/24   Vicky Charleston, PA-C    Physical Exam: Vitals:   03/25/24 1945 03/25/24 2000 03/25/24 2215 03/25/24 2225  BP:  125/63 (!) 119/58   Pulse: 89 88 87   Resp:      Temp:    98.2 F (36.8 C)  TempSrc:    Oral  SpO2: 94% 93% 96%   Weight:      Height:       Constitutional: Resting in bed, NAD, calm, comfortable Eyes: EOMI, lids and conjunctivae normal ENMT: Mucous membranes are moist. Posterior pharynx clear of any exudate or lesions.Normal dentition.  Neck: normal, supple, no masses. Respiratory: clear to auscultation bilaterally, no wheezing, no crackles. Normal respiratory effort. No accessory muscle use.  Cardiovascular: Regular rate  and rhythm, no murmurs / rubs / gallops. No extremity edema. 2+ pedal pulses. Abdomen: Suprapubic tenderness, no masses palpated. Musculoskeletal: no clubbing / cyanosis. No joint deformity upper and lower extremities. Good ROM, no contractures. Normal muscle tone.  Skin: no rashes, lesions, ulcers. No induration Neurologic: Sensation intact. Strength 5/5 in all 4.  Psychiatric: Normal judgment and insight. Alert and oriented x 3. Normal mood.   EKG: Not performed.  Assessment/Plan Principal Problem:   Perforation of sigmoid colon due to diverticulitis Active Problems:   Depression, major, single episode, in partial remission   Essential hypertension   Hyperlipidemia   April Castillo is a 68 y.o. female with medical history significant for HTN, HLD, depression, nephrolithiasis s/p left ureteral stenting (03/16/2024) who is admitted with sigmoid diverticulitis with contained perforation.  Assessment and Plan: Sigmoid diverticulitis with contained perforation: Seen on CT imaging.  She is afebrile and hemodynamically stable.  General surgery following. - Continue IV Zosyn - Keep  NP continue IV fluid hydration overnight  Hypertension: Holding oral meds while NPO.  IV hydralazine ordered as needed.  Hyperlipidemia: Oral meds on hold.  Depression: Oral meds on hold.  S/p left ureteral stenting 03/16/2024: CT shows interval resolution of previous hydronephrosis with left ureteral stent in place.   DVT prophylaxis: SCDs Start: 03/25/24 2259 Code Status: Full code, confirmed with patient on admission Family Communication: Significant other at bedside Disposition Plan: From home, dispo pending clinical progress Consults called: General Surgery Severity of Illness: The appropriate patient status for this patient is INPATIENT. Inpatient status is judged to be reasonable and necessary in order to provide the required intensity of service to ensure the patient's safety. The patient's presenting symptoms, physical exam findings, and initial radiographic and laboratory data in the context of their chronic comorbidities is felt to place them at high risk for further clinical deterioration. Furthermore, it is not anticipated that the patient will be medically stable for discharge from the hospital within 2 midnights of admission.   * I certify that at the point of admission it is my clinical judgment that the patient will require inpatient hospital care spanning beyond 2 midnights from the point of admission due to high intensity of service, high risk for further deterioration and high frequency of surveillance required.DEWAINE Jorie Blanch MD [facility]  If 7PM-7AM, please contact night-coverage [email or web address]  03/25/2024, 11:03 PM

## 2024-03-26 LAB — BASIC METABOLIC PANEL WITH GFR
Anion gap: 11 (ref 5–15)
BUN: 18 mg/dL (ref 8–23)
CO2: 25 mmol/L (ref 22–32)
Calcium: 8.7 mg/dL — ABNORMAL LOW (ref 8.9–10.3)
Chloride: 103 mmol/L (ref 98–111)
Creatinine, Ser: 0.77 mg/dL (ref 0.44–1.00)
GFR, Estimated: >60 mL/min (ref >60)
Glucose, Bld: 89 mg/dL (ref 70–99)
Potassium: 4.2 mmol/L (ref 3.5–5.1)
Sodium: 139 mmol/L (ref 135–145)

## 2024-03-26 LAB — CBC
HCT: 31.2 % — ABNORMAL LOW (ref 36.0–46.0)
Hemoglobin: 10 g/dL — ABNORMAL LOW (ref 12.0–15.0)
MCH: 31.6 pg (ref 26.0–34.0)
MCHC: 32.1 g/dL (ref 30.0–36.0)
MCV: 98.7 fL (ref 80.0–100.0)
Platelets: 309 K/uL (ref 150–400)
RBC: 3.16 MIL/uL — ABNORMAL LOW (ref 3.87–5.11)
RDW: 12.7 % (ref 11.5–15.5)
WBC: 11.9 K/uL — ABNORMAL HIGH (ref 4.0–10.5)
nRBC: 0 % (ref 0.0–0.2)

## 2024-03-26 LAB — HIV ANTIBODY (ROUTINE TESTING W REFLEX): HIV Screen 4th Generation wRfx: NONREACTIVE

## 2024-03-26 MED ORDER — ORAL CARE MOUTH RINSE: 15.0000 mL | OROMUCOSAL | Status: DC | PRN

## 2024-03-26 NOTE — Plan of Care (Signed)
   Problem: Coping: Goal: Level of anxiety will decrease Outcome: Progressing   Problem: Pain Managment: Goal: General experience of comfort will improve and/or be controlled Outcome: Progressing

## 2024-03-26 NOTE — Progress Notes (Signed)
   03/26/24 1019  TOC Brief Assessment  Insurance and Status Reviewed  Patient has primary care physician Yes  Home environment has been reviewed home with s/o  Prior level of function: independent  Prior/Current Home Services No current home services  Social Drivers of Health Review SDOH reviewed no interventions necessary  Readmission risk has been reviewed Yes  Transition of care needs no transition of care needs at this time

## 2024-03-26 NOTE — Progress Notes (Signed)
 Mobility Specialist Progress Note:   03/26/24 0939  Mobility  Activity Ambulated independently  Level of Assistance Independent  Assistive Device None  Distance Ambulated (ft) 400 ft  Activity Response Tolerated well  Mobility Referral Yes  Mobility visit 1 Mobility  Mobility Specialist Start Time (ACUTE ONLY) 0911  Mobility Specialist Stop Time (ACUTE ONLY) 0920  Mobility Specialist Time Calculation (min) (ACUTE ONLY) 9 min   Pt was received in bed and agreed to mobility. No complaints during ambulation. Returned to bed with all needs met.  [employer] - Mobility Specialist

## 2024-03-26 NOTE — Progress Notes (Signed)
 Patient expressed concern about not having being given some of her home medication. Explained contents of MD notes as to rationale of holding medications at this time. Patient expressed understanding and asked me to note for oncoming MD to discuss with her in the am. Plan of care ongoing.

## 2024-03-26 NOTE — Progress Notes (Signed)
 [facility] PROGRESS NOTE    Progress Note  CLEMIE [facility]AL  FMW:[id number] DOB: 1955/07/23 DOA: 03/25/2024 PCP: Cesario Mutton, MD     Brief Narrative:   ALETHA ALLEBACH is an 68 y.o. female past medical history significant for essential hypertension, hyperlipidemia and nephrolithiasis status post left ureteral stenting on November 2024 comes into the ED for abdominal pain, was found to have a white count of 18,000 CT scan of the abdomen pelvis showed perforated sigmoid diverticulitis with contained perforation started empirically on IV Zosyn IV Toradol  and fluids General Surgery was consulted.  Assessment/Plan:   Perforation of sigmoid colon due to diverticulitis General Surgery consulted. Keep the patient NPO. Continue IV Zosyn and IV fluids. Has defervesced leukocytosis improved. Further management per general surgery.   Depression, major, single episode, in partial remission Continue hold meds will resume once surgery with surgery to start a diet.  Essential hypertension Off antihypertensive medication blood pressure seems to be well-controlled, continue hydralazine as needed. Continue to monitor.  Hyperlipidemia: Noted.  History of ureteral stenting in November 2024: CT showed resolution of hydronephrosis.  DVT prophylaxis: lovenox  Family Communication:none Status is: Inpatient Remains inpatient appropriate because: Acute perforated diverticulitis    Code Status:     Code Status Orders  (From admission, onward)           Start     Ordered   03/25/24 2259  Full code  Continuous       Question:  By:  Answer:  Consent: discussion documented in EHR   03/25/24 2259           Code Status History     Date Active Date Inactive Code Status Order ID Comments User Context   10/28/2019 0152 11/07/2019 0004 Full Code [id number]  Howerter, Eva NOVAK, DO ED      Advance Directive Documentation    Flowsheet Row Most Recent Value  Type of Advance Directive  Healthcare Power of Attorney  Pre-existing out of facility DNR order (yellow form or pink MOST form) --  MOST Form in Place? --      IV Access:   Peripheral IV   Procedures and diagnostic studies:   CT ABDOMEN PELVIS WO CONTRAST Result Date: 03/25/2024 EXAM: CT ABDOMEN AND PELVIS WITHOUT CONTRAST 03/25/2024 08:10:27 PM TECHNIQUE: CT of the abdomen and pelvis was performed without the administration of intravenous contrast. Multiplanar reformatted images are provided for review. Automated exposure control, iterative reconstruction, and/or weight-based adjustment of the mA/kV was utilized to reduce the radiation dose to as low as reasonably achievable. COMPARISON: Comparison with the 03/08/2024. CLINICAL HISTORY: Abdominal pain, acute, nonlocalized. FINDINGS: LOWER CHEST: No acute abnormality. LIVER: The liver is unremarkable. GALLBLADDER AND BILE DUCTS: Cholelithiasis without acute cholecystitis. No biliary ductal dilatation. SPLEEN: No acute abnormality. PANCREAS: No acute abnormality. ADRENAL GLANDS: No acute abnormality. KIDNEYS, URETERS AND BLADDER: Left ureteral stent. No stones in the kidneys or ureters. No hydronephrosis. No perinephric or periureteral stranding. Urinary bladder is unremarkable. GI AND BOWEL: Stomach demonstrates no acute abnormality. Wall thickening about loops of small bowel in the low pelvis. Extensive mesenteric edema and vascular engorgement surrounding a focus of gas extraluminal measuring 3 x 2 cm (series 2 image 58). This is favored to arise from a perforated sigmoid diverticulum (series 8 image 60). There is no bowel obstruction. PERITONEUM AND RETROPERITONEUM: No ascites. No free air. VASCULATURE: Aorta is normal in caliber. LYMPH NODES: No lymphadenopathy. REPRODUCTIVE ORGANS: Fibroid uterus. BONES AND SOFT TISSUES: Screw fixation of the  right femoral neck. No acute osseous abnormality. No focal soft tissue abnormality. IMPRESSION: 1. Perforated sigmoid  diverticulitis with contained perforation in the low anterior pelvis. 2. Wall thickening about loops of small bowel in the low pelvis is favored reactive secondary to perforated sigmoid diverticulitis. 3. Resolution of the previous hydronephrosis after placement of a left ureteral stent. Critical value/emergent results were called by telephone at the time of interpretation on 03/25/24 @ 9:10 PM to Dr. Ula, who verbally acknowledged these results. Electronically signed by: Norman Gatlin MD 03/25/2024 09:13 PM EST RP Workstation: [id number]     Medical Consultants:   None.   Subjective:    [name] she relates her pain is controlled had a bowel movement yesterday.  Objective:    Vitals:   03/25/24 2225 03/25/24 2305 03/26/24 0228 03/26/24 0546  BP:  (!) 145/69 136/69 (!) 127/58  Pulse:  92 85 83  Resp:  18 18 18   Temp: 98.2 F (36.8 C) 98.6 F (37 C) 98.5 F (36.9 C) 98.4 F (36.9 C)  TempSrc: Oral Oral Oral Oral  SpO2:  96% 97% 96%  Weight:      Height:       SpO2: 96 %   Intake/Output Summary (Last 24 hours) at 03/26/2024 9366 Last data filed at 03/26/2024 0200 Gross per 24 hour  Intake 0 ml  Output --  Net 0 ml   Filed Weights   03/25/24 1456  Weight: 69.9 kg    Exam: General exam: In no acute distress. Respiratory system: Good air movement and clear to auscultation. Cardiovascular system: S1 & S2 heard, RRR. No JVD. Gastrointestinal system: Abdomen is nondistended, soft and nontender.  Extremities: No pedal edema. Skin: No rashes, lesions or ulcers Psychiatry: Judgement and insight appear normal. Mood & affect appropriate.    Data Reviewed:    Labs: Basic Metabolic Panel: Recent Labs  Lab 03/25/24 1532 03/26/24 0430  NA 136 139  K 4.2 4.2  CL 101 103  CO2 22 25  GLUCOSE 124* 89  BUN 17 18  CREATININE 0.77 0.77  CALCIUM  9.1 8.7*   GFR Estimated Creatinine Clearance: 65.2 mL/min (by C-G formula based on SCr of 0.77 mg/dL). Liver  Function Tests: Recent Labs  Lab 03/25/24 1532  AST 26  ALT 27  ALKPHOS 135*  BILITOT 0.8  PROT 7.1  ALBUMIN 3.7   Recent Labs  Lab 03/25/24 1532  LIPASE <10*   No results for input(s): AMMONIA in the last 168 hours. Coagulation profile No results for input(s): INR, PROTIME in the last 168 hours. COVID-19 Labs  No results for input(s): DDIMER, FERRITIN, LDH, CRP in the last 72 hours.  Lab Results  Component Value Date   SARSCOV2NAA NEGATIVE 04/28/2020   SARSCOV2NAA NEGATIVE 11/06/2019   SARSCOV2NAA NEGATIVE 10/27/2019    CBC: Recent Labs  Lab 03/25/24 1532 03/26/24 0430  WBC 18.6* 11.9*  HGB 11.2* 10.0*  HCT 33.8* 31.2*  MCV 97.4 98.7  PLT 345 309   Cardiac Enzymes: No results for input(s): CKTOTAL, CKMB, CKMBINDEX, TROPONINI in the last 168 hours. BNP (last 3 results) No results for input(s): PROBNP in the last 8760 hours. CBG: No results for input(s): GLUCAP in the last 168 hours. D-Dimer: No results for input(s): DDIMER in the last 72 hours. Hgb A1c: No results for input(s): HGBA1C in the last 72 hours. Lipid Profile: No results for input(s): CHOL, HDL, LDLCALC, TRIG, CHOLHDL, LDLDIRECT in the last 72 hours. Thyroid function studies: No results for  input(s): TSH, T4TOTAL, T3FREE, THYROIDAB in the last 72 hours.  Invalid input(s): FREET3 Anemia work up: No results for input(s): VITAMINB12, FOLATE, FERRITIN, TIBC, IRON, RETICCTPCT in the last 72 hours. Sepsis Labs: Recent Labs  Lab 03/25/24 1532 03/25/24 1538 03/25/24 1747 03/26/24 0430  WBC 18.6*  --   --  11.9*  LATICACIDVEN  --  1.0 1.1  --    Microbiology No results found for this or any previous visit (from the past 240 hours).   Medications:    sodium chloride  flush  3 mL Intravenous Q12H   Continuous Infusions:  lactated ringers  125 mL/hr at 03/25/24 2341   piperacillin-tazobactam (ZOSYN)  IV 3.375 g (03/26/24 0540)       LOS: 1 day   Erle Odell Castor  [facility]  03/26/2024, 6:33 AM

## 2024-03-27 DIAGNOSIS — K5720 Diverticulitis of large intestine with perforation and abscess without bleeding: Secondary | ICD-10-CM | POA: Diagnosis not present

## 2024-03-27 MED ORDER — SODIUM CHLORIDE 0.9 % IV SOLN: INTRAVENOUS | Status: AC

## 2024-03-27 NOTE — Progress Notes (Signed)
 [facility] PROGRESS NOTE    Progress Note  April Castillo  FMW:[id number] DOB: 1956-04-02 DOA: 03/25/2024 PCP: Cesario Mutton, MD     Brief Narrative:   April Castillo is an 68 y.o. female past medical history significant for essential hypertension, hyperlipidemia and nephrolithiasis status post left ureteral stenting on November 2024 comes into the ED for abdominal pain, was found to have a white count of 18,000 CT scan of the abdomen pelvis showed perforated sigmoid diverticulitis with contained perforation started empirically on IV Zosyn  IV Toradol  and fluids General Surgery was consulted.  Assessment/Plan:   Perforation of sigmoid colon due to diverticulitis General Surgery recommended conservative management monitor for complications. Continue IV fluids and IV antibiotics. She has defervesced leukocytosis resolved. Further management per general surgery. Slightly better she relates she is hungry.   Depression, major, single episode, in partial remission Continue hold meds will resume once surgery with surgery to start a diet.  Essential hypertension Off antihypertensive medication blood pressure seems to be well-controlled, continue hydralazine  as needed. Continue to monitor.  Hyperlipidemia: Noted.  History of ureteral stenting in November 2024: CT showed resolution of hydronephrosis.  DVT prophylaxis: lovenox  Family Communication:none Status is: Inpatient Remains inpatient appropriate because: Acute perforated diverticulitis    Code Status:     Code Status Orders  (From admission, onward)           Start     Ordered   03/25/24 2259  Full code  Continuous       Question:  By:  Answer:  Consent: discussion documented in EHR   03/25/24 2259           Code Status History     Date Active Date Inactive Code Status Order ID Comments User Context   10/28/2019 0152 11/07/2019 0004 Full Code [id number]  Howerter, Eva NOVAK, DO ED      Advance Directive  Documentation    Flowsheet Row Most Recent Value  Type of Advance Directive Healthcare Power of Attorney  Pre-existing out of facility DNR order (yellow form or pink MOST form) --  MOST Form in Place? --      IV Access:   Peripheral IV   Procedures and diagnostic studies:   CT ABDOMEN PELVIS WO CONTRAST Result Date: 03/25/2024 EXAM: CT ABDOMEN AND PELVIS WITHOUT CONTRAST 03/25/2024 08:10:27 PM TECHNIQUE: CT of the abdomen and pelvis was performed without the administration of intravenous contrast. Multiplanar reformatted images are provided for review. Automated exposure control, iterative reconstruction, and/or weight-based adjustment of the mA/kV was utilized to reduce the radiation dose to as low as reasonably achievable. COMPARISON: Comparison with the 03/08/2024. CLINICAL HISTORY: Abdominal pain, acute, nonlocalized. FINDINGS: LOWER CHEST: No acute abnormality. LIVER: The liver is unremarkable. GALLBLADDER AND BILE DUCTS: Cholelithiasis without acute cholecystitis. No biliary ductal dilatation. SPLEEN: No acute abnormality. PANCREAS: No acute abnormality. ADRENAL GLANDS: No acute abnormality. KIDNEYS, URETERS AND BLADDER: Left ureteral stent. No stones in the kidneys or ureters. No hydronephrosis. No perinephric or periureteral stranding. Urinary bladder is unremarkable. GI AND BOWEL: Stomach demonstrates no acute abnormality. Wall thickening about loops of small bowel in the low pelvis. Extensive mesenteric edema and vascular engorgement surrounding a focus of gas extraluminal measuring 3 x 2 cm (series 2 image 58). This is favored to arise from a perforated sigmoid diverticulum (series 8 image 60). There is no bowel obstruction. PERITONEUM AND RETROPERITONEUM: No ascites. No free air. VASCULATURE: Aorta is normal in caliber. LYMPH NODES: No lymphadenopathy. REPRODUCTIVE ORGANS: Fibroid  uterus. BONES AND SOFT TISSUES: Screw fixation of the right femoral neck. No acute osseous abnormality.  No focal soft tissue abnormality. IMPRESSION: 1. Perforated sigmoid diverticulitis with contained perforation in the low anterior pelvis. 2. Wall thickening about loops of small bowel in the low pelvis is favored reactive secondary to perforated sigmoid diverticulitis. 3. Resolution of the previous hydronephrosis after placement of a left ureteral stent. Critical value/emergent results were called by telephone at the time of interpretation on 03/25/24 @ 9:10 PM to Dr. Ula, who verbally acknowledged these results. Electronically signed by: Norman Gatlin MD 03/25/2024 09:13 PM EST RP Workstation: [id number]     Medical Consultants:   None.   Subjective:    April Castillo is to have watery stool no abdominal pain.,  She relates she feels slightly better.  Objective:    Vitals:   03/26/24 1817 03/26/24 2040 03/27/24 0139 03/27/24 0500  BP: 129/66 130/68 (!) 121/58 124/62  Pulse: 77 76 75 81  Resp: 18 15 16 15   Temp: 98.6 F (37 C) 98.8 F (37.1 C) 98.4 F (36.9 C) 98.4 F (36.9 C)  TempSrc: Oral Oral Oral Oral  SpO2: 99% 96% 95% 97%  Weight:      Height:       SpO2: 97 %   Intake/Output Summary (Last 24 hours) at 03/27/2024 0836 Last data filed at 03/27/2024 0542 Gross per 24 hour  Intake 129.57 ml  Output --  Net 129.57 ml   Filed Weights   03/25/24 1456  Weight: 69.9 kg    Exam: General exam: In no acute distress. Respiratory system: Good air movement and clear to auscultation. Cardiovascular system: S1 & S2 heard, RRR. No JVD. Gastrointestinal system: Abdomen is nondistended, soft and nontender.  Extremities: No pedal edema. Skin: No rashes, lesions or ulcers Psychiatry: Judgement and insight appear normal. Mood & affect appropriate.  Data Reviewed:    Labs: Basic Metabolic Panel: Recent Labs  Lab 03/25/24 1532 03/26/24 0430  NA 136 139  K 4.2 4.2  CL 101 103  CO2 22 25  GLUCOSE 124* 89  BUN 17 18  CREATININE 0.77 0.77  CALCIUM  9.1 8.7*    GFR Estimated Creatinine Clearance: 65.2 mL/min (by C-G formula based on SCr of 0.77 mg/dL). Liver Function Tests: Recent Labs  Lab 03/25/24 1532  AST 26  ALT 27  ALKPHOS 135*  BILITOT 0.8  PROT 7.1  ALBUMIN 3.7   Recent Labs  Lab 03/25/24 1532  LIPASE <10*   No results for input(s): AMMONIA in the last 168 hours. Coagulation profile No results for input(s): INR, PROTIME in the last 168 hours. COVID-19 Labs  No results for input(s): DDIMER, FERRITIN, LDH, CRP in the last 72 hours.  Lab Results  Component Value Date   SARSCOV2NAA NEGATIVE 04/28/2020   SARSCOV2NAA NEGATIVE 11/06/2019   SARSCOV2NAA NEGATIVE 10/27/2019    CBC: Recent Labs  Lab 03/25/24 1532 03/26/24 0430  WBC 18.6* 11.9*  HGB 11.2* 10.0*  HCT 33.8* 31.2*  MCV 97.4 98.7  PLT 345 309   Cardiac Enzymes: No results for input(s): CKTOTAL, CKMB, CKMBINDEX, TROPONINI in the last 168 hours. BNP (last 3 results) No results for input(s): PROBNP in the last 8760 hours. CBG: No results for input(s): GLUCAP in the last 168 hours. D-Dimer: No results for input(s): DDIMER in the last 72 hours. Hgb A1c: No results for input(s): HGBA1C in the last 72 hours. Lipid Profile: No results for input(s): CHOL, HDL, LDLCALC, TRIG, CHOLHDL, LDLDIRECT in  the last 72 hours. Thyroid function studies: No results for input(s): TSH, T4TOTAL, T3FREE, THYROIDAB in the last 72 hours.  Invalid input(s): FREET3 Anemia work up: No results for input(s): VITAMINB12, FOLATE, FERRITIN, TIBC, IRON, RETICCTPCT in the last 72 hours. Sepsis Labs: Recent Labs  Lab 03/25/24 1532 03/25/24 1538 03/25/24 1747 03/26/24 0430  WBC 18.6*  --   --  11.9*  LATICACIDVEN  --  1.0 1.1  --    Microbiology No results found for this or any previous visit (from the past 240 hours).   Medications:    sodium chloride  flush  3 mL Intravenous Q12H   Continuous Infusions:   sodium chloride      piperacillin -tazobactam (ZOSYN )  IV 3.375 g (03/27/24 0603)      LOS: 2 days   Erle Odell Castor  [facility]  03/27/2024, 8:36 AM

## 2024-03-27 NOTE — Progress Notes (Signed)
 Subjective/Chief Complaint: Pt doing well this AM Less pain   Objective: Vital signs in last 24 hours: Temp:  [97.7 F (36.5 C)-98.8 F (37.1 C)] 98.4 F (36.9 C) (12/05 0500) Pulse Rate:  [75-81] 81 (12/05 0500) Resp:  [15-18] 15 (12/05 0500) BP: (115-132)/(58-68) 124/62 (12/05 0500) SpO2:  [95 %-99 %] 97 % (12/05 0500) Last BM Date : 03/26/24  Intake/Output from previous day: 12/04 0701 - 12/05 0700 In: 129.6 [IV Piggyback:129.6] Out: -  Intake/Output this shift: No intake/output data recorded.  PE:  Constitutional: No acute distress, conversant, appears states age. Eyes: Anicteric sclerae, moist conjunctiva, no lid lag Lungs: Clear to auscultation bilaterally, normal respiratory effort CV: regular rate and rhythm, no murmurs, no peripheral edema, pedal pulses 2+ GI: Soft, no masses or hepatosplenomegaly, non-tender to palpation Skin: No rashes, palpation reveals normal turgor Psychiatric: appropriate judgment and insight, oriented to person, place, and time   Lab Results:  Recent Labs    03/25/24 1532 03/26/24 0430  WBC 18.6* 11.9*  HGB 11.2* 10.0*  HCT 33.8* 31.2*  PLT 345 309   BMET Recent Labs    03/25/24 1532 03/26/24 0430  NA 136 139  K 4.2 4.2  CL 101 103  CO2 22 25  GLUCOSE 124* 89  BUN 17 18  CREATININE 0.77 0.77  CALCIUM  9.1 8.7*   PT/INR No results for input(s): LABPROT, INR in the last 72 hours. ABG No results for input(s): PHART, HCO3 in the last 72 hours.  Invalid input(s): PCO2, PO2  Studies/Results: CT ABDOMEN PELVIS WO CONTRAST Result Date: 03/25/2024 EXAM: CT ABDOMEN AND PELVIS WITHOUT CONTRAST 03/25/2024 08:10:27 PM TECHNIQUE: CT of the abdomen and pelvis was performed without the administration of intravenous contrast. Multiplanar reformatted images are provided for review. Automated exposure control, iterative reconstruction, and/or weight-based adjustment of the mA/kV was utilized to reduce the radiation  dose to as low as reasonably achievable. COMPARISON: Comparison with the 03/08/2024. CLINICAL HISTORY: Abdominal pain, acute, nonlocalized. FINDINGS: LOWER CHEST: No acute abnormality. LIVER: The liver is unremarkable. GALLBLADDER AND BILE DUCTS: Cholelithiasis without acute cholecystitis. No biliary ductal dilatation. SPLEEN: No acute abnormality. PANCREAS: No acute abnormality. ADRENAL GLANDS: No acute abnormality. KIDNEYS, URETERS AND BLADDER: Left ureteral stent. No stones in the kidneys or ureters. No hydronephrosis. No perinephric or periureteral stranding. Urinary bladder is unremarkable. GI AND BOWEL: Stomach demonstrates no acute abnormality. Wall thickening about loops of small bowel in the low pelvis. Extensive mesenteric edema and vascular engorgement surrounding a focus of gas extraluminal measuring 3 x 2 cm (series 2 image 58). This is favored to arise from a perforated sigmoid diverticulum (series 8 image 60). There is no bowel obstruction. PERITONEUM AND RETROPERITONEUM: No ascites. No free air. VASCULATURE: Aorta is normal in caliber. LYMPH NODES: No lymphadenopathy. REPRODUCTIVE ORGANS: Fibroid uterus. BONES AND SOFT TISSUES: Screw fixation of the right femoral neck. No acute osseous abnormality. No focal soft tissue abnormality. IMPRESSION: 1. Perforated sigmoid diverticulitis with contained perforation in the low anterior pelvis. 2. Wall thickening about loops of small bowel in the low pelvis is favored reactive secondary to perforated sigmoid diverticulitis. 3. Resolution of the previous hydronephrosis after placement of a left ureteral stent. Critical value/emergent results were called by telephone at the time of interpretation on 03/25/24 @ 9:10 PM to Dr. Ula, who verbally acknowledged these results. Electronically signed by: Norman Gatlin MD 03/25/2024 09:13 PM EST RP Workstation: [id number]    Anti-infectives: Anti-infectives (From admission, onward)    Start  Dose/Rate Route  Frequency Ordered Stop   03/25/24 2200  piperacillin -tazobactam (ZOSYN ) IVPB 3.375 g        3.375 g 12.5 mL/hr over 240 Minutes Intravenous Every 8 hours 03/25/24 2125         Assessment/Plan: 59F with perforated diverticulitis -Con't with abx -mobilize -OK to start clears  LOS: 2 days    Lynda Leos 03/27/2024

## 2024-03-27 NOTE — Plan of Care (Signed)
  Problem: Activity: Goal: Risk for activity intolerance will decrease Outcome: Progressing   Problem: Pain Managment: Goal: General experience of comfort will improve and/or be controlled Outcome: Progressing

## 2024-03-28 DIAGNOSIS — K5720 Diverticulitis of large intestine with perforation and abscess without bleeding: Secondary | ICD-10-CM | POA: Diagnosis not present

## 2024-03-28 LAB — CBC
HCT: 32.2 % — ABNORMAL LOW (ref 36.0–46.0)
Hemoglobin: 10.5 g/dL — ABNORMAL LOW (ref 12.0–15.0)
MCH: 31.9 pg (ref 26.0–34.0)
MCHC: 32.6 g/dL (ref 30.0–36.0)
MCV: 97.9 fL (ref 80.0–100.0)
Platelets: 348 K/uL (ref 150–400)
RBC: 3.29 MIL/uL — ABNORMAL LOW (ref 3.87–5.11)
RDW: 12.2 % (ref 11.5–15.5)
WBC: 7.6 K/uL (ref 4.0–10.5)
nRBC: 0 % (ref 0.0–0.2)

## 2024-03-28 MED ORDER — POLYVINYL ALCOHOL 1.4 % OP SOLN: 1.0000 [drp] | Freq: Every day | OPHTHALMIC | Status: DC | PRN

## 2024-03-28 MED ORDER — ASPIRIN 81 MG PO TBEC
81.0000 mg | DELAYED_RELEASE_TABLET | Freq: Every day | ORAL | Status: DC
  Administered 2024-03-28 – 2024-04-01 (×5): 81 mg via ORAL
  Filled 2024-03-28 (×5): qty 1

## 2024-03-28 MED ORDER — ROSUVASTATIN CALCIUM 20 MG PO TABS
20.0000 mg | ORAL_TABLET | Freq: Every day | ORAL | Status: DC
  Administered 2024-03-28 – 2024-03-31 (×4): 20 mg via ORAL
  Filled 2024-03-28 (×4): qty 1

## 2024-03-28 MED ORDER — FLUOXETINE HCL 20 MG PO CAPS
20.0000 mg | ORAL_CAPSULE | Freq: Every day | ORAL | Status: DC
  Administered 2024-03-28 – 2024-04-01 (×5): 20 mg via ORAL
  Filled 2024-03-28 (×5): qty 1

## 2024-03-28 NOTE — Progress Notes (Signed)
   Interval: Low grade temp 99.4. Pain continues to improve. Transient nausea has resolved.   Objective: Vital signs in last 24 hours: Temp:  [98.4 F (36.9 C)-99.4 F (37.4 C)] 98.4 F (36.9 C) (12/06 0642) Pulse Rate:  [76-84] 76 (12/06 0642) Resp:  [16-18] 16 (12/06 0642) BP: (141-144)/(64-73) 141/73 (12/06 0642) SpO2:  [96 %-97 %] 96 % (12/06 0642) Last BM Date : 03/28/24  Intake/Output from previous day: 12/05 0701 - 12/06 0700 In: 2083.6 [P.O.:600; I.V.:1245.1; IV Piggyback:238.5] Out: -  Intake/Output this shift: No intake/output data recorded.  PE:  Constitutional: No acute distress, conversant, appears states age. Eyes: Anicteric sclerae, moist conjunctiva, no lid lag Lungs: NWOB of room air CV: regular rate and rhythm GI: Soft, minimal tenderness to deep palpation of suprapubic region Psychiatric: appropriate judgment and insight, oriented to person, place, and time   Lab Results:  Recent Labs    03/25/24 1532 03/26/24 0430  WBC 18.6* 11.9*  HGB 11.2* 10.0*  HCT 33.8* 31.2*  PLT 345 309   BMET Recent Labs    03/25/24 1532 03/26/24 0430  NA 136 139  K 4.2 4.2  CL 101 103  CO2 22 25  GLUCOSE 124* 89  BUN 17 18  CREATININE 0.77 0.77  CALCIUM  9.1 8.7*   PT/INR No results for input(s): LABPROT, INR in the last 72 hours. ABG No results for input(s): PHART, HCO3 in the last 72 hours.  Invalid input(s): PCO2, PO2  Studies/Results: No results found.   Anti-infectives: Anti-infectives (From admission, onward)    Start     Dose/Rate Route Frequency Ordered Stop   03/25/24 2200  piperacillin -tazobactam (ZOSYN ) IVPB 3.375 g        3.375 g 12.5 mL/hr over 240 Minutes Intravenous Every 8 hours 03/25/24 2125         Assessment/Plan: 14F with perforated diverticulitis -Con't with abx -mobilize -Advance to FLD - Check CBC given low grade temp  - If WBC stable, can advance to low fiber diet in PM if tolerates FLD. If rising  WBC, would keep on FLD for today.  LOS: 3 days    Orie Silversmith, MD General Surgery, Surgical Critical Care and Trauma

## 2024-03-28 NOTE — Progress Notes (Signed)
 [facility] PROGRESS NOTE    Progress Note  STEPHAN DRAUGHN  FMW:[id number] DOB: Mar 24, 1956 DOA: 03/25/2024 PCP: Cesario Mutton, MD     Brief Narrative:   April Castillo is an 68 y.o. female past medical history significant for essential hypertension, hyperlipidemia and nephrolithiasis status post left ureteral stenting on November 2024 comes into the ED for abdominal pain, was found to have a white count of 18,000 CT scan of the abdomen pelvis showed perforated sigmoid diverticulitis with contained perforation started empirically on IV Zosyn  IV Toradol  and fluids General Surgery was consulted.  Assessment/Plan:   Perforation of sigmoid colon due to diverticulitis General Surgery recommended conservative management monitor for complications. Continue IV Zosyn  KVO IV fluids. Tmax 99.4. Further management per general surgery. Surgery recommended to advance to soft diet.  Depression, major, single episode, in partial remission Resume home regimen.  Essential hypertension Off antihypertensive medication blood pressure seems to be well-controlled, continue hydralazine  as needed. Blood pressure seems to be relatively controlled continue to hold antihypertensive medication.  Hyperlipidemia: Noted.  History of ureteral stenting in November 2024: CT showed resolution of hydronephrosis.  DVT prophylaxis: lovenox  Family Communication:none Status is: Inpatient Remains inpatient appropriate because: Acute perforated diverticulitis    Code Status:     Code Status Orders  (From admission, onward)           Start     Ordered   03/25/24 2259  Full code  Continuous       Question:  By:  Answer:  Consent: discussion documented in EHR   03/25/24 2259           Code Status History     Date Active Date Inactive Code Status Order ID Comments User Context   10/28/2019 0152 11/07/2019 0004 Full Code [id number]  [name], DO ED      Advance Directive Documentation     Flowsheet Row Most Recent Value  Type of Advance Directive Healthcare Power of Attorney  Pre-existing out of facility DNR order (yellow form or pink MOST form) --  MOST Form in Place? --      IV Access:   Peripheral IV   Procedures and diagnostic studies:   No results found.    Medical Consultants:   None.   Subjective:    April Castillo feels better wants to advance her diet.  Objective:    Vitals:   03/27/24 0500 03/27/24 1405 03/27/24 2208 03/28/24 0642  BP: 124/62 (!) 141/70 (!) 144/64 (!) 141/73  Pulse: 81 84 83 76  Resp: 15 18 16 16   Temp: 98.4 F (36.9 C)  99.4 F (37.4 C) 98.4 F (36.9 C)  TempSrc: Oral  Oral Oral  SpO2: 97% 97% 96% 96%  Weight:      Height:       SpO2: 96 %   Intake/Output Summary (Last 24 hours) at 03/28/2024 0932 Last data filed at 03/28/2024 9350 Gross per 24 hour  Intake 2083.55 ml  Output --  Net 2083.55 ml   Filed Weights   03/25/24 1456  Weight: 69.9 kg    Exam: General exam: In no acute distress. Respiratory system: Good air movement and clear to auscultation. Cardiovascular system: S1 & S2 heard, RRR. No JVD. Gastrointestinal system: Abdomen is nondistended, soft and nontender.  Extremities: No pedal edema. Skin: No rashes, lesions or ulcers Psychiatry: Judgement and insight appear normal. Mood & affect appropriate.  Data Reviewed:    Labs: Basic Metabolic Panel: Recent Labs  Lab 03/25/24 1532 03/26/24 0430  NA 136 139  K 4.2 4.2  CL 101 103  CO2 22 25  GLUCOSE 124* 89  BUN 17 18  CREATININE 0.77 0.77  CALCIUM  9.1 8.7*   GFR Estimated Creatinine Clearance: 65.2 mL/min (by C-G formula based on SCr of 0.77 mg/dL). Liver Function Tests: Recent Labs  Lab 03/25/24 1532  AST 26  ALT 27  ALKPHOS 135*  BILITOT 0.8  PROT 7.1  ALBUMIN 3.7   Recent Labs  Lab 03/25/24 1532  LIPASE <10*   No results for input(s): AMMONIA in the last 168 hours. Coagulation profile No results for  input(s): INR, PROTIME in the last 168 hours. COVID-19 Labs  No results for input(s): DDIMER, FERRITIN, LDH, CRP in the last 72 hours.  Lab Results  Component Value Date   SARSCOV2NAA NEGATIVE 04/28/2020   SARSCOV2NAA NEGATIVE 11/06/2019   SARSCOV2NAA NEGATIVE 10/27/2019    CBC: Recent Labs  Lab 03/25/24 1532 03/26/24 0430  WBC 18.6* 11.9*  HGB 11.2* 10.0*  HCT 33.8* 31.2*  MCV 97.4 98.7  PLT 345 309   Cardiac Enzymes: No results for input(s): CKTOTAL, CKMB, CKMBINDEX, TROPONINI in the last 168 hours. BNP (last 3 results) No results for input(s): PROBNP in the last 8760 hours. CBG: No results for input(s): GLUCAP in the last 168 hours. D-Dimer: No results for input(s): DDIMER in the last 72 hours. Hgb A1c: No results for input(s): HGBA1C in the last 72 hours. Lipid Profile: No results for input(s): CHOL, HDL, LDLCALC, TRIG, CHOLHDL, LDLDIRECT in the last 72 hours. Thyroid function studies: No results for input(s): TSH, T4TOTAL, T3FREE, THYROIDAB in the last 72 hours.  Invalid input(s): FREET3 Anemia work up: No results for input(s): VITAMINB12, FOLATE, FERRITIN, TIBC, IRON, RETICCTPCT in the last 72 hours. Sepsis Labs: Recent Labs  Lab 03/25/24 1532 03/25/24 1538 03/25/24 1747 03/26/24 0430  WBC 18.6*  --   --  11.9*  LATICACIDVEN  --  1.0 1.1  --    Microbiology No results found for this or any previous visit (from the past 240 hours).   Medications:    sodium chloride  flush  3 mL Intravenous Q12H   Continuous Infusions:  piperacillin -tazobactam (ZOSYN )  IV 3.375 g (03/28/24 0522)      LOS: 3 days   Erle Odell Castor  [facility]  03/28/2024, 9:32 AM

## 2024-03-29 DIAGNOSIS — K5720 Diverticulitis of large intestine with perforation and abscess without bleeding: Secondary | ICD-10-CM | POA: Diagnosis not present

## 2024-03-29 LAB — CBC
HCT: 33.6 % — ABNORMAL LOW (ref 36.0–46.0)
Hemoglobin: 10.9 g/dL — ABNORMAL LOW (ref 12.0–15.0)
MCH: 31.4 pg (ref 26.0–34.0)
MCHC: 32.4 g/dL (ref 30.0–36.0)
MCV: 96.8 fL (ref 80.0–100.0)
Platelets: 385 K/uL (ref 150–400)
RBC: 3.47 MIL/uL — ABNORMAL LOW (ref 3.87–5.11)
RDW: 12.2 % (ref 11.5–15.5)
WBC: 8.9 K/uL (ref 4.0–10.5)
nRBC: 0 % (ref 0.0–0.2)

## 2024-03-29 NOTE — Progress Notes (Signed)
 [facility] PROGRESS NOTE    Progress Note  FARRIS BLASH  FMW:[id number] DOB: 15-Oct-1955 DOA: 03/25/2024 PCP: Cesario Mutton, MD     Brief Narrative:   TIWANA CHAVIS is an 68 y.o. female past medical history significant for essential hypertension, hyperlipidemia and nephrolithiasis status post left ureteral stenting on November 2024 comes into the ED for abdominal pain, was found to have a white count of 18,000 CT scan of the abdomen pelvis showed perforated sigmoid diverticulitis with contained perforation started empirically on IV Zosyn  IV Toradol  and fluids General Surgery was consulted.  Assessment/Plan:   Perforation of sigmoid colon due to diverticulitis Continue conservative management Continue IV Zosyn , she has had increased abdominal pain today. Tmax 98.7.  Leukocytosis is down today.  Anorexic continues to have bowel movements. Appreciate general surgery's assistance. Back to a full liquid diet.  Depression, major, single episode, in partial remission Resume home regimen.  Essential hypertension Off antihypertensive medication blood pressure seems to be well-controlled, continue hydralazine  as needed. Blood pressure seems to be relatively controlled continue to hold antihypertensive medication.  Hyperlipidemia: Noted.  History of ureteral stenting in November 2024: CT showed resolution of hydronephrosis.  DVT prophylaxis: lovenox  Family Communication:none Status is: Inpatient Remains inpatient appropriate because: Acute perforated diverticulitis    Code Status:     Code Status Orders  (From admission, onward)           Start     Ordered   03/25/24 2259  Full code  Continuous       Question:  By:  Answer:  Consent: discussion documented in EHR   03/25/24 2259           Code Status History     Date Active Date Inactive Code Status Order ID Comments User Context   10/28/2019 0152 11/07/2019 0004 Full Code [id number]  [name], DO ED       Advance Directive Documentation    Flowsheet Row Most Recent Value  Type of Advance Directive Healthcare Power of Attorney  Pre-existing out of facility DNR order (yellow form or pink MOST form) --  MOST Form in Place? --      IV Access:   Peripheral IV   Procedures and diagnostic studies:   No results found.    Medical Consultants:   None.   Subjective:    [name] anorexic today.  Objective:    Vitals:   03/28/24 0642 03/28/24 1349 03/28/24 2040 03/29/24 0511  BP: (!) 141/73 (!) 154/78 (!) 144/73 136/77  Pulse: 76 88 87 74  Resp: 16 18 15 15   Temp: 98.4 F (36.9 C) 98 F (36.7 C) 98.7 F (37.1 C) 98.2 F (36.8 C)  TempSrc: Oral Oral Oral Oral  SpO2: 96% 97% 97% 95%  Weight:      Height:       SpO2: 95 %   Intake/Output Summary (Last 24 hours) at 03/29/2024 0823 Last data filed at 03/29/2024 0640 Gross per 24 hour  Intake 824.06 ml  Output 0 ml  Net 824.06 ml   Filed Weights   03/25/24 1456  Weight: 69.9 kg    Exam: General exam: In no acute distress. Respiratory system: Good air movement and clear to auscultation. Cardiovascular system: S1 & S2 heard, RRR. No JVD. Gastrointestinal system: Positive bowel sounds soft abdomen less distended but she has significant left lower quadrant pain.  No rebound or guarding the Extremities: No pedal edema. Skin: No rashes, lesions or ulcers Psychiatry:  Judgement and insight appear normal. Mood & affect appropriate.  Data Reviewed:    Labs: Basic Metabolic Panel: Recent Labs  Lab 03/25/24 1532 03/26/24 0430  NA 136 139  K 4.2 4.2  CL 101 103  CO2 22 25  GLUCOSE 124* 89  BUN 17 18  CREATININE 0.77 0.77  CALCIUM  9.1 8.7*   GFR Estimated Creatinine Clearance: 65.2 mL/min (by C-G formula based on SCr of 0.77 mg/dL). Liver Function Tests: Recent Labs  Lab 03/25/24 1532  AST 26  ALT 27  ALKPHOS 135*  BILITOT 0.8  PROT 7.1  ALBUMIN 3.7   Recent Labs  Lab  03/25/24 1532  LIPASE <10*   No results for input(s): AMMONIA in the last 168 hours. Coagulation profile No results for input(s): INR, PROTIME in the last 168 hours. COVID-19 Labs  No results for input(s): DDIMER, FERRITIN, LDH, CRP in the last 72 hours.  Lab Results  Component Value Date   SARSCOV2NAA NEGATIVE 04/28/2020   SARSCOV2NAA NEGATIVE 11/06/2019   SARSCOV2NAA NEGATIVE 10/27/2019    CBC: Recent Labs  Lab 03/25/24 1532 03/26/24 0430 03/28/24 0929 03/29/24 0508  WBC 18.6* 11.9* 7.6 8.9  HGB 11.2* 10.0* 10.5* 10.9*  HCT 33.8* 31.2* 32.2* 33.6*  MCV 97.4 98.7 97.9 96.8  PLT 345 309 348 385   Cardiac Enzymes: No results for input(s): CKTOTAL, CKMB, CKMBINDEX, TROPONINI in the last 168 hours. BNP (last 3 results) No results for input(s): PROBNP in the last 8760 hours. CBG: No results for input(s): GLUCAP in the last 168 hours. D-Dimer: No results for input(s): DDIMER in the last 72 hours. Hgb A1c: No results for input(s): HGBA1C in the last 72 hours. Lipid Profile: No results for input(s): CHOL, HDL, LDLCALC, TRIG, CHOLHDL, LDLDIRECT in the last 72 hours. Thyroid function studies: No results for input(s): TSH, T4TOTAL, T3FREE, THYROIDAB in the last 72 hours.  Invalid input(s): FREET3 Anemia work up: No results for input(s): VITAMINB12, FOLATE, FERRITIN, TIBC, IRON, RETICCTPCT in the last 72 hours. Sepsis Labs: Recent Labs  Lab 03/25/24 1532 03/25/24 1538 03/25/24 1747 03/26/24 0430 03/28/24 0929 03/29/24 0508  WBC 18.6*  --   --  11.9* 7.6 8.9  LATICACIDVEN  --  1.0 1.1  --   --   --    Microbiology No results found for this or any previous visit (from the past 240 hours).   Medications:    aspirin  EC  81 mg Oral Daily   FLUoxetine   20 mg Oral Daily   rosuvastatin   20 mg Oral QHS   sodium chloride  flush  3 mL Intravenous Q12H   Continuous Infusions:  piperacillin -tazobactam  (ZOSYN )  IV 3.375 g (03/29/24 0505)      LOS: 4 days   Erle Odell Castor  [facility]  03/29/2024, 8:23 AM

## 2024-03-29 NOTE — Progress Notes (Addendum)
   Interval: Afebrile. Felt a bit more pain last night and this AM. Still minimal   Objective: Vital signs in last 24 hours: Temp:  [98 F (36.7 C)-98.7 F (37.1 C)] 98.2 F (36.8 C) (12/07 0511) Pulse Rate:  [74-88] 74 (12/07 0511) Resp:  [15-18] 15 (12/07 0511) BP: (136-154)/(73-78) 136/77 (12/07 0511) SpO2:  [95 %-97 %] 95 % (12/07 0511) Last BM Date : 03/28/24  Intake/Output from previous day: 12/06 0701 - 12/07 0700 In: 824.1 [P.O.:690; IV Piggyback:134.1] Out: 0  Intake/Output this shift: No intake/output data recorded.  PE:  Constitutional: No acute distress, conversant, appears states age. Eyes: Anicteric sclerae, moist conjunctiva, no lid lag Lungs: NWOB of room air CV: regular rate and rhythm GI: Soft, mild tenderness to palpation in LLQ without rebound or guarding Psychiatric: appropriate judgment and insight, oriented to person, place, and time   Lab Results:  Recent Labs    03/28/24 0929 03/29/24 0508  WBC 7.6 8.9  HGB 10.5* 10.9*  HCT 32.2* 33.6*  PLT 348 385   BMET No results for input(s): NA, K, CL, CO2, GLUCOSE, BUN, CREATININE, CALCIUM  in the last 72 hours.  PT/INR No results for input(s): LABPROT, INR in the last 72 hours. ABG No results for input(s): PHART, HCO3 in the last 72 hours.  Invalid input(s): PCO2, PO2  Studies/Results: No results found.   Anti-infectives: Anti-infectives (From admission, onward)    Start     Dose/Rate Route Frequency Ordered Stop   03/25/24 2200  piperacillin -tazobactam (ZOSYN ) IVPB 3.375 g        3.375 g 12.5 mL/hr over 240 Minutes Intravenous Every 8 hours 03/25/24 2125         Assessment/Plan: 64F with perforated diverticulitis -Con't with IV antibiotics given slight increase in pain - No leukocytosis - mobilize - Given pain is still relatively minimal, can continue diet, TRH has deescalated to FLD which is reasonable. If has any increase in pain or nausea would  make NPO and repeat CT scan. - If not worsening but no improvement over next 24h will tentatively repeat CT tomorrow AM  LOS: 4 days   Discussed plan with Dr. Odell Celinda Orie Ann, MD General Surgery, Surgical Critical Care and Trauma

## 2024-03-30 NOTE — Progress Notes (Signed)
 Mobility Specialist - Progress Note   03/30/24 1142  Mobility  Activity Ambulated independently  Level of Assistance Independent after set-up  Assistive Device None  Distance Ambulated (ft) 500 ft  Range of Motion/Exercises Active  Activity Response Tolerated well  Mobility Referral Yes  Mobility visit 1 Mobility  Mobility Specialist Start Time (ACUTE ONLY) 1142  Mobility Specialist Stop Time (ACUTE ONLY) 1154  Mobility Specialist Time Calculation (min) (ACUTE ONLY) 12 min   Received in bed and agreed to mobility, had no issues throughout session, returned to bed with all needs met.   Cyndee Ada Mobility Specialist

## 2024-03-30 NOTE — Plan of Care (Signed)
   Problem: Education: Goal: Knowledge of General Education information will improve Description Including pain rating scale, medication(s)/side effects and non-pharmacologic comfort measures Outcome: Progressing   Problem: Clinical Measurements: Goal: Ability to maintain clinical measurements within normal limits will improve Outcome: Progressing   Problem: Clinical Measurements: Goal: Will remain free from infection Outcome: Progressing

## 2024-03-30 NOTE — Progress Notes (Signed)
 [facility] PROGRESS NOTE    Progress Note  CUMA POLYAKOV  FMW:[id number] DOB: 23-Dec-1955 DOA: 03/25/2024 PCP: Cesario Mutton, MD   Brief Narrative:   April Castillo is an 68 y.o. female past medical history significant for essential hypertension, hyperlipidemia and nephrolithiasis status post left ureteral stenting on November 2024 comes into the ED for abdominal pain, was found to have a white count of 18,000 CT scan of the abdomen pelvis showed perforated sigmoid diverticulitis with contained perforation started empirically on IV Zosyn  IV Toradol  and fluids General Surgery was consulted.  Assessment/Plan:  Perforation of sigmoid colon due to diverticulitis: Continue conservative management. Continue IV Zosyn , she has had increased abdominal pain today. Has remained afebrile tolerated her diet yesterday. She relates her abdominal pain has improved today. Appreciate general surgery's assistance. Now on a soft diet  Depression, major, single episode, in partial remission Resume home regimen.  Essential hypertension Off antihypertensive medication blood pressure seems to be well-controlled, continue hydralazine  as needed. Blood pressure seems to be relatively controlled continue to hold antihypertensive medication.  Hyperlipidemia: Noted.  History of ureteral stenting in November 2024: CT showed resolution of hydronephrosis.  DVT prophylaxis: lovenox  Family Communication:none Status is: Inpatient Remains inpatient appropriate because: Acute perforated diverticulitis    Code Status:     Code Status Orders  (From admission, onward)           Start     Ordered   03/25/24 2259  Full code  Continuous       Question:  By:  Answer:  Consent: discussion documented in EHR   03/25/24 2259           Code Status History     Date Active Date Inactive Code Status Order ID Comments User Context   10/28/2019 0152 11/07/2019 0004 Full Code [id number]  [name], DO  ED      Advance Directive Documentation    Flowsheet Row Most Recent Value  Type of Advance Directive Healthcare Power of Attorney  Pre-existing out of facility DNR order (yellow form or pink MOST form) --  MOST Form in Place? --      IV Access:   Peripheral IV   Procedures and diagnostic studies:   No results found.    Medical Consultants:   None.   Subjective:    April Castillo hungry today  Objective:    Vitals:   03/29/24 0511 03/29/24 1323 03/29/24 2012 03/30/24 0520  BP: 136/77 (!) 144/78 (!) 151/79 (!) 144/79  Pulse: 74 78 72 76  Resp: 15 17 18 19   Temp: 98.2 F (36.8 C) 98 F (36.7 C) 98.2 F (36.8 C) 98.3 F (36.8 C)  TempSrc: Oral Oral Oral Oral  SpO2: 95% 96% 97% 95%  Weight:      Height:       SpO2: 95 %   Intake/Output Summary (Last 24 hours) at 03/30/2024 0835 Last data filed at 03/30/2024 0543 Gross per 24 hour  Intake 580 ml  Output --  Net 580 ml   Filed Weights   03/25/24 1456  Weight: 69.9 kg    Exam: General exam: In no acute distress. Respiratory system: Good air movement and clear to auscultation. Cardiovascular system: S1 & S2 heard, RRR. No JVD. Gastrointestinal system: Positive bowel sounds soft less distended some very mild tenderness no rebound or guarding Extremities: No pedal edema. Skin: No rashes, lesions or ulcers Psychiatry: Judgement and insight appear normal. Mood & affect appropriate.  Data  Reviewed:    Labs: Basic Metabolic Panel: Recent Labs  Lab 03/25/24 1532 03/26/24 0430  NA 136 139  K 4.2 4.2  CL 101 103  CO2 22 25  GLUCOSE 124* 89  BUN 17 18  CREATININE 0.77 0.77  CALCIUM  9.1 8.7*   GFR Estimated Creatinine Clearance: 65.2 mL/min (by C-G formula based on SCr of 0.77 mg/dL). Liver Function Tests: Recent Labs  Lab 03/25/24 1532  AST 26  ALT 27  ALKPHOS 135*  BILITOT 0.8  PROT 7.1  ALBUMIN 3.7   Recent Labs  Lab 03/25/24 1532  LIPASE <10*   No results for input(s):  AMMONIA in the last 168 hours. Coagulation profile No results for input(s): INR, PROTIME in the last 168 hours. COVID-19 Labs  No results for input(s): DDIMER, FERRITIN, LDH, CRP in the last 72 hours.  Lab Results  Component Value Date   SARSCOV2NAA NEGATIVE 04/28/2020   SARSCOV2NAA NEGATIVE 11/06/2019   SARSCOV2NAA NEGATIVE 10/27/2019    CBC: Recent Labs  Lab 03/25/24 1532 03/26/24 0430 03/28/24 0929 03/29/24 0508  WBC 18.6* 11.9* 7.6 8.9  HGB 11.2* 10.0* 10.5* 10.9*  HCT 33.8* 31.2* 32.2* 33.6*  MCV 97.4 98.7 97.9 96.8  PLT 345 309 348 385   Cardiac Enzymes: No results for input(s): CKTOTAL, CKMB, CKMBINDEX, TROPONINI in the last 168 hours. BNP (last 3 results) No results for input(s): PROBNP in the last 8760 hours. CBG: No results for input(s): GLUCAP in the last 168 hours. D-Dimer: No results for input(s): DDIMER in the last 72 hours. Hgb A1c: No results for input(s): HGBA1C in the last 72 hours. Lipid Profile: No results for input(s): CHOL, HDL, LDLCALC, TRIG, CHOLHDL, LDLDIRECT in the last 72 hours. Thyroid function studies: No results for input(s): TSH, T4TOTAL, T3FREE, THYROIDAB in the last 72 hours.  Invalid input(s): FREET3 Anemia work up: No results for input(s): VITAMINB12, FOLATE, FERRITIN, TIBC, IRON, RETICCTPCT in the last 72 hours. Sepsis Labs: Recent Labs  Lab 03/25/24 1532 03/25/24 1538 03/25/24 1747 03/26/24 0430 03/28/24 0929 03/29/24 0508  WBC 18.6*  --   --  11.9* 7.6 8.9  LATICACIDVEN  --  1.0 1.1  --   --   --    Microbiology No results found for this or any previous visit (from the past 240 hours).   Medications:    aspirin  EC  81 mg Oral Daily   FLUoxetine   20 mg Oral Daily   rosuvastatin   20 mg Oral QHS   sodium chloride  flush  3 mL Intravenous Q12H   Continuous Infusions:  piperacillin -tazobactam (ZOSYN )  IV 3.375 g (03/30/24 0524)      LOS: 5 days    Erle Odell Castor  [facility]  03/30/2024, 8:35 AM

## 2024-03-30 NOTE — Progress Notes (Signed)
   Interval: Pain improved   Objective: Vital signs in last 24 hours: Temp:  [98 F (36.7 C)-98.3 F (36.8 C)] 98.3 F (36.8 C) (12/08 0520) Pulse Rate:  [72-78] 76 (12/08 0520) Resp:  [17-19] 19 (12/08 0520) BP: (144-151)/(78-79) 144/79 (12/08 0520) SpO2:  [95 %-97 %] 95 % (12/08 0520) Last BM Date : 03/28/24  Intake/Output from previous day: 12/07 0701 - 12/08 0700 In: 580 [P.O.:530; IV Piggyback:50] Out: -  Intake/Output this shift: No intake/output data recorded.  PE:  Constitutional: No acute distress, conversant, appears states age. GI: Soft, mild tenderness to palpation in LLQ without rebound or guarding Psychiatric: appropriate judgment and insight, oriented to person, place, and time   Lab Results:  Recent Labs    03/28/24 0929 03/29/24 0508  WBC 7.6 8.9  HGB 10.5* 10.9*  HCT 32.2* 33.6*  PLT 348 385   BMET No results for input(s): NA, K, CL, CO2, GLUCOSE, BUN, CREATININE, CALCIUM  in the last 72 hours.  PT/INR No results for input(s): LABPROT, INR in the last 72 hours. ABG No results for input(s): PHART, HCO3 in the last 72 hours.  Invalid input(s): PCO2, PO2  Studies/Results: No results found.   Anti-infectives: Anti-infectives (From admission, onward)    Start     Dose/Rate Route Frequency Ordered Stop   03/25/24 2200  piperacillin -tazobactam (ZOSYN ) IVPB 3.375 g        3.375 g 12.5 mL/hr over 240 Minutes Intravenous Every 8 hours 03/25/24 2125         Assessment/Plan: 57F with perforated diverticulitis -Con't with IV antibiotics  - No leukocytosis, will recheck in AM - mobilize - Will try advancing diet today again.  If has any increase in pain or nausea would make NPO and repeat CT scan. - If continues to improve over next 24h will be ready for d/c.  If not, will need repeat CT   LOS: 5 days     Bernarda JAYSON Ned, MD  Colorectal and General Surgery [facility] Surgery

## 2024-03-31 ENCOUNTER — Inpatient Hospital Stay (HOSPITAL_COMMUNITY)

## 2024-03-31 LAB — CBC
HCT: 34.6 % — ABNORMAL LOW (ref 36.0–46.0)
Hemoglobin: 11.4 g/dL — ABNORMAL LOW (ref 12.0–15.0)
MCH: 31.8 pg (ref 26.0–34.0)
MCHC: 32.9 g/dL (ref 30.0–36.0)
MCV: 96.4 fL (ref 80.0–100.0)
Platelets: 395 K/uL (ref 150–400)
RBC: 3.59 MIL/uL — ABNORMAL LOW (ref 3.87–5.11)
RDW: 12.2 % (ref 11.5–15.5)
WBC: 11.2 K/uL — ABNORMAL HIGH (ref 4.0–10.5)
nRBC: 0 % (ref 0.0–0.2)

## 2024-03-31 MED ORDER — IOHEXOL 9 MG/ML PO SOLN
1000.0000 mL | ORAL | Status: AC
  Administered 2024-03-31: 1000 mL via ORAL

## 2024-03-31 MED ORDER — IOHEXOL 300 MG/ML  SOLN
100.0000 mL | Freq: Once | INTRAMUSCULAR | Status: AC | PRN
  Administered 2024-03-31: 100 mL via INTRAVENOUS

## 2024-03-31 MED ORDER — SODIUM CHLORIDE (PF) 0.9 % IJ SOLN
INTRAMUSCULAR | Status: AC
  Filled 2024-03-31: qty 50

## 2024-03-31 NOTE — Progress Notes (Signed)
 [facility] PROGRESS NOTE    Progress Note  DAWANNA GRAUBERGER  FMW:[id number] DOB: 17-Jul-1955 DOA: 03/25/2024 PCP: Cesario Mutton, MD   Brief Narrative:   April Castillo is an 68 y.o. female past medical history significant for essential hypertension, hyperlipidemia and nephrolithiasis status post left ureteral stenting on November 2024 comes into the ED for abdominal pain, was found to have a white count of 18,000 CT scan of the abdomen pelvis showed perforated sigmoid diverticulitis with contained perforation started empirically on IV Zosyn  IV Toradol  and fluids General Surgery was consulted.  Assessment/Plan:  Perforation of sigmoid colon due to diverticulitis: Continue conservative management. Continue IV Zosyn .  Abdominal exam is benign she denies any pain tolerating her diet. White count is slightly elevated today. CT scan of the abdomen pelvis with contrast to rule out abscess.  Depression, major, single episode, in partial remission Resume home regimen.  Essential hypertension Off antihypertensive medication blood pressure seems to be well-controlled, continue hydralazine  as needed. Blood pressure seems to be relatively controlled continue to hold antihypertensive medication.  Hyperlipidemia: Noted.  History of ureteral stenting in November 2024: CT showed resolution of hydronephrosis.  DVT prophylaxis: lovenox  Family Communication:none Status is: Inpatient Remains inpatient appropriate because: Acute perforated diverticulitis    Code Status:     Code Status Orders  (From admission, onward)           Start     Ordered   03/25/24 2259  Full code  Continuous       Question:  By:  Answer:  Consent: discussion documented in EHR   03/25/24 2259           Code Status History     Date Active Date Inactive Code Status Order ID Comments User Context   10/28/2019 0152 11/07/2019 0004 Full Code [id number]  [name], DO ED      Advance Directive  Documentation    Flowsheet Row Most Recent Value  Type of Advance Directive Healthcare Power of Attorney  Pre-existing out of facility DNR order (yellow form or pink MOST form) --  MOST Form in Place? --      IV Access:   Peripheral IV   Procedures and diagnostic studies:   No results found.    Medical Consultants:   None.   Subjective:    April Castillo no abdominal pain today has been tolerating her diet.  Objective:    Vitals:   03/30/24 0520 03/30/24 1340 03/30/24 2017 03/31/24 0549  BP: (!) 144/79 (!) 141/75 (!) 142/78 139/74  Pulse: 76 80 78 81  Resp: 19 16 16 16   Temp: 98.3 F (36.8 C) 98.6 F (37 C) 98.3 F (36.8 C) 97.8 F (36.6 C)  TempSrc: Oral Oral Oral Oral  SpO2: 95% 96% 96% 97%  Weight:      Height:       SpO2: 97 %   Intake/Output Summary (Last 24 hours) at 03/31/2024 0830 Last data filed at 03/31/2024 0600 Gross per 24 hour  Intake 2347.5 ml  Output 0 ml  Net 2347.5 ml   Filed Weights   03/25/24 1456  Weight: 69.9 kg    Exam: General exam: In no acute distress. Respiratory system: Good air movement and clear to auscultation. Cardiovascular system: S1 & S2 heard, RRR. No JVD. Gastrointestinal system: Positive bowel sounds soft less distended some very mild tenderness no rebound or guarding Extremities: No pedal edema. Skin: No rashes, lesions or ulcers Psychiatry: Judgement and insight appear  normal. Mood & affect appropriate.  Data Reviewed:    Labs: Basic Metabolic Panel: Recent Labs  Lab 03/25/24 1532 03/26/24 0430  NA 136 139  K 4.2 4.2  CL 101 103  CO2 22 25  GLUCOSE 124* 89  BUN 17 18  CREATININE 0.77 0.77  CALCIUM  9.1 8.7*   GFR Estimated Creatinine Clearance: 65.2 mL/min (by C-G formula based on SCr of 0.77 mg/dL). Liver Function Tests: Recent Labs  Lab 03/25/24 1532  AST 26  ALT 27  ALKPHOS 135*  BILITOT 0.8  PROT 7.1  ALBUMIN 3.7   Recent Labs  Lab 03/25/24 1532  LIPASE <10*   No  results for input(s): AMMONIA in the last 168 hours. Coagulation profile No results for input(s): INR, PROTIME in the last 168 hours. COVID-19 Labs  No results for input(s): DDIMER, FERRITIN, LDH, CRP in the last 72 hours.  Lab Results  Component Value Date   SARSCOV2NAA NEGATIVE 04/28/2020   SARSCOV2NAA NEGATIVE 11/06/2019   SARSCOV2NAA NEGATIVE 10/27/2019    CBC: Recent Labs  Lab 03/25/24 1532 03/26/24 0430 03/28/24 0929 03/29/24 0508 03/31/24 0446  WBC 18.6* 11.9* 7.6 8.9 11.2*  HGB 11.2* 10.0* 10.5* 10.9* 11.4*  HCT 33.8* 31.2* 32.2* 33.6* 34.6*  MCV 97.4 98.7 97.9 96.8 96.4  PLT 345 309 348 385 395   Cardiac Enzymes: No results for input(s): CKTOTAL, CKMB, CKMBINDEX, TROPONINI in the last 168 hours. BNP (last 3 results) No results for input(s): PROBNP in the last 8760 hours. CBG: No results for input(s): GLUCAP in the last 168 hours. D-Dimer: No results for input(s): DDIMER in the last 72 hours. Hgb A1c: No results for input(s): HGBA1C in the last 72 hours. Lipid Profile: No results for input(s): CHOL, HDL, LDLCALC, TRIG, CHOLHDL, LDLDIRECT in the last 72 hours. Thyroid function studies: No results for input(s): TSH, T4TOTAL, T3FREE, THYROIDAB in the last 72 hours.  Invalid input(s): FREET3 Anemia work up: No results for input(s): VITAMINB12, FOLATE, FERRITIN, TIBC, IRON, RETICCTPCT in the last 72 hours. Sepsis Labs: Recent Labs  Lab 03/25/24 1538 03/25/24 1747 03/26/24 0430 03/28/24 0929 03/29/24 0508 03/31/24 0446  WBC  --   --  11.9* 7.6 8.9 11.2*  LATICACIDVEN 1.0 1.1  --   --   --   --    Microbiology No results found for this or any previous visit (from the past 240 hours).   Medications:    aspirin  EC  81 mg Oral Daily   FLUoxetine   20 mg Oral Daily   rosuvastatin   20 mg Oral QHS   sodium chloride  flush  3 mL Intravenous Q12H   Continuous Infusions:   piperacillin -tazobactam (ZOSYN )  IV 3.375 g (03/31/24 0604)      LOS: 6 days   Erle Odell Castor  [facility]  03/31/2024, 8:30 AM

## 2024-03-31 NOTE — Progress Notes (Signed)
   Interval: Pain improved still, but WBC up this morning.  Tolerating soft diet.     Objective: Vital signs in last 24 hours: Temp:  [97.8 F (36.6 C)-98.6 F (37 C)] 97.8 F (36.6 C) (12/09 0549) Pulse Rate:  [78-81] 81 (12/09 0549) Resp:  [16] 16 (12/09 0549) BP: (139-142)/(74-78) 139/74 (12/09 0549) SpO2:  [96 %-97 %] 97 % (12/09 0549) Last BM Date : 03/30/24  Intake/Output from previous day: 12/08 0701 - 12/09 0700 In: 2347.5 [P.O.:2260; IV Piggyback:87.5] Out: 0  Intake/Output this shift: Total I/O In: 3 [I.V.:3] Out: -   PE:  Constitutional: No acute distress GI: Soft, mild tenderness to palpation in LLQ without rebound or guarding   Lab Results:  Recent Labs    03/29/24 0508 03/31/24 0446  WBC 8.9 11.2*  HGB 10.9* 11.4*  HCT 33.6* 34.6*  PLT 385 395   BMET No results for input(s): NA, K, CL, CO2, GLUCOSE, BUN, CREATININE, CALCIUM  in the last 72 hours.  PT/INR No results for input(s): LABPROT, INR in the last 72 hours. ABG No results for input(s): PHART, HCO3 in the last 72 hours.  Invalid input(s): PCO2, PO2  Studies/Results: No results found.   Anti-infectives: Anti-infectives (From admission, onward)    Start     Dose/Rate Route Frequency Ordered Stop   03/25/24 2200  piperacillin -tazobactam (ZOSYN ) IVPB 3.375 g        3.375 g 12.5 mL/hr over 240 Minutes Intravenous Every 8 hours 03/25/24 2125         Assessment/Plan: 1F with perforated diverticulitis -Con't with IV antibiotics  - WBC bumped slightly up to 11.2 today.  Will CT to assure no complications prior to discharge. - mobilize - cont soft diet - will cont to monitor  FEN - soft VTE - SCDs ID - [email or web address]    LOS: 6 days   Burnard FORBES Banter, PA-C  General Surgery [facility]

## 2024-03-31 NOTE — Progress Notes (Signed)
 Patient discharged, IV removed, discharge paperwork provided and explained, patient verbalized understanding, MD aware that patient qualifies for oxygen but patient is unable to accommodate oxygen tanks due to being homeless, patient being discharged with oxygen tanks due to living status.

## 2024-03-31 NOTE — Plan of Care (Signed)
 ?  Problem: Clinical Measurements: ?Goal: Will remain free from infection ?Outcome: Progressing ?  ?

## 2024-04-01 ENCOUNTER — Other Ambulatory Visit (HOSPITAL_COMMUNITY): Payer: Self-pay

## 2024-04-01 ENCOUNTER — Other Ambulatory Visit: Payer: Self-pay

## 2024-04-01 DIAGNOSIS — F324 Major depressive disorder, single episode, in partial remission: Secondary | ICD-10-CM

## 2024-04-01 DIAGNOSIS — I10 Essential (primary) hypertension: Secondary | ICD-10-CM

## 2024-04-01 DIAGNOSIS — E782 Mixed hyperlipidemia: Secondary | ICD-10-CM

## 2024-04-01 MED ORDER — AMOXICILLIN-POT CLAVULANATE 400-57 MG/5ML PO SUSR
800.0000 mg | Freq: Two times a day (BID) | ORAL | 0 refills | Status: AC
  Filled 2024-04-01 (×2): qty 100, 5d supply, fill #0

## 2024-04-01 MED ORDER — POLYETHYLENE GLYCOL 3350 17 G PO PACK
17.0000 g | PACK | Freq: Every day | ORAL | Status: DC
  Filled 2024-04-01: qty 1

## 2024-04-01 NOTE — Progress Notes (Signed)
 Discharge medication delivered to patient at the bedside, discharge instructions given to patient, ride called ready to discharge

## 2024-04-01 NOTE — Discharge Instructions (Signed)
 Low fiber x 4 weeks, then high fiber diet to follow

## 2024-04-01 NOTE — Progress Notes (Signed)
 Nutrition Note  RD consulted for nutrition education regarding Nutrition Management for Diverticulosis/Diverticulitis.  RD provided both Low Fiber Nutrition Therapy and High Fiber Nutrition Therapy handout from the Academy of [employer]. Reviewed home diet with pt and suggested ways to meet nutrition goals over the next several weeks. Explained reasons to follow Low Fiber diet for the next 4 weeks and discussed ways to achieve. Discussed best practice for long-term management of diverticulosis is a High Fiber diet and discussed ways to increase fiber content in an appropriate time frame. Encouraged fresh fruits and vegetables as well as whole grain sources of carbohydrates to maximize fiber intake. Encouraged fluid intake. Discussed symptom management should abdominal pain occur while on high fiber diet.  Pt verbalizes understanding of information provided. Expect good compliance.  Body mass index is 25.63 kg/m^2. Pt meets criteria for normal based on current BMI.  Current diet order is Soft, patient is consuming approximately 100% of meals at this time. Labs and medications reviewed. No further nutrition interventions warranted at this time. If additional nutrition issues arise, please re-consult Rd.  Morna Lee, MS, RD, LDN Inpatient Clinical Dietitian Contact via Secure chat

## 2024-04-01 NOTE — Plan of Care (Signed)
 ?  Problem: Clinical Measurements: ?Goal: Ability to maintain clinical measurements within normal limits will improve ?Outcome: Progressing ?Goal: Will remain free from infection ?Outcome: Progressing ?Goal: Diagnostic test results will improve ?Outcome: Progressing ?  ?

## 2024-04-01 NOTE — Progress Notes (Signed)
 Interval: Feels well.  Minimal to no pain.  Tolerating a solid diet.  Discussed her CT scan results with her.   Objective: Vital signs in last 24 hours: Temp:  [98.3 F (36.8 C)-98.6 F (37 C)] 98.5 F (36.9 C) (12/10 0529) Pulse Rate:  [84-91] 86 (12/10 0529) Resp:  [15-16] 15 (12/10 0529) BP: (140-156)/(68-79) 140/68 (12/10 0529) SpO2:  [95 %-97 %] 96 % (12/10 0529) Last BM Date : 03/31/24  Intake/Output from previous day: 12/09 0701 - 12/10 0700 In: 751.7 [P.O.:620; I.V.:3; IV Piggyback:128.7] Out: -  Intake/Output this shift: No intake/output data recorded.  PE:  Constitutional: No acute distress GI: Soft, NT to palpation in LLQ  today. ND  Lab Results:  Recent Labs    03/31/24 0446  WBC 11.2*  HGB 11.4*  HCT 34.6*  PLT 395   BMET No results for input(s): NA, K, CL, CO2, GLUCOSE, BUN, CREATININE, CALCIUM  in the last 72 hours.  PT/INR No results for input(s): LABPROT, INR in the last 72 hours. ABG No results for input(s): PHART, HCO3 in the last 72 hours.  Invalid input(s): PCO2, PO2  Studies/Results: CT ABDOMEN PELVIS W CONTRAST Result Date: 03/31/2024 CLINICAL DATA:  Intra-abdominal abscess. EXAM: CT ABDOMEN AND PELVIS WITH CONTRAST TECHNIQUE: Multidetector CT imaging of the abdomen and pelvis was performed using the standard protocol following bolus administration of intravenous contrast. RADIATION DOSE REDUCTION: This exam was performed according to the departmental dose-optimization program which includes automated exposure control, adjustment of the mA and/or kV according to patient size and/or use of iterative reconstruction technique. CONTRAST:  OMNIPAQUE  IOHEXOL  300 MG/ML  SOLN COMPARISON:  CT dated 03/25/2024. FINDINGS: Lower chest: Bibasilar atelectasis. No intra-abdominal free air.  Small free fluid in the pelvis. Hepatobiliary: The liver is unremarkable. No biliary dilatation. Layering clusters of stones in the  gallbladder. No pericholecystic fluid or evidence of acute cholecystitis by CT. Pancreas: Unremarkable. No pancreatic ductal dilatation or surrounding inflammatory changes. Spleen: Normal in size without focal abnormality. Adrenals/Urinary Tract: The adrenal glands are unremarkable. There is mild left hydronephrosis. Left ureteral stent with proximal tip in the renal pelvis and distal end in the urinary bladder. The right kidney is unremarkable. There is symmetric enhancement and excretion of contrast by both kidneys. The urinary bladder is unremarkable. Stomach/Bowel: There is segmental thickening of the rectosigmoid. Several small sigmoid diverticula noted. No significant interval change in the size of the loculated pocket of air adjacent to the sigmoid colon consistent with a focally contained perforation. No fluid collection or abscess. Interval decrease in the inflammatory infiltrate within the mesentery. There is no bowel obstruction. The appendix is normal. Vascular/Lymphatic: Mild aortoiliac atherosclerotic disease. The IVC is unremarkable. No portal venous gas. There is no adenopathy. Reproductive: The uterus is anteverted. Calcified uterine fibroids noted. No suspicious adnexal mass. Other: None Musculoskeletal: Right femoral neck screws. No acute osseous pathology. IMPRESSION: 1. No significant interval change in the size of the loculated pocket of air adjacent to the sigmoid colon consistent with a focally contained perforation. No fluid collection or abscess. 2. Mild left hydronephrosis with left ureteral stent in place. 3. Cholelithiasis. 4.  Aortic Atherosclerosis (ICD10-I70.0). Electronically Signed   By: Vanetta Chou M.D.   On: 03/31/2024 15:42     Anti-infectives: Anti-infectives (From admission, onward)    Start     Dose/Rate Route Frequency Ordered Stop   03/25/24 2200  piperacillin -tazobactam (ZOSYN ) IVPB 3.375 g        3.375 g 12.5 mL/hr  over 240 Minutes Intravenous Every 8 hours  03/25/24 2125         Assessment/Plan: 27F with perforated diverticulitis -transition to oral abx for 7 more days. - repeat CT shows stability with continued inflammatory changes to her sigmoid colon, but no new abscess or perforation.  Collection of air is stable - she just had a colonoscopy this year so doesn't need a new one.  She does not need surgical follow up at this time unless she were to develop other complicating features such as a stricture or have recurrent episodes. - mobilize - cont soft diet x 4 weeks, patient request a dietitian consult to discuss this.  Then high fiber to follow. - surgically stable for DC home.  D/w primary service  FEN - soft VTE - SCDs ID - [email or web address]    LOS: 7 days   Burnard FORBES Banter, PA-C  General Surgery [facility]

## 2024-04-01 NOTE — Discharge Summary (Signed)
 Physician Discharge Summary   Patient: April Castillo MRN: [id number] DOB: [birth year]  Admit date:     03/25/2024  Discharge date: 04/01/2024  Discharge Physician: Concepcion Riser   PCP: Cesario Mutton, MD   Recommendations at discharge:   PCP follow-up in 1 week. Urology follow-up as scheduled.  Discharge Diagnoses: Principal Problem:   Perforation of sigmoid colon due to diverticulitis Active Problems:   Depression, major, single episode, in partial remission   Essential hypertension   Hyperlipidemia  Resolved Problems:   * No resolved hospital problems. [facility] Course: April Castillo is a 68 y.o. female with medical history significant for HTN, HLD, depression, nephrolithiasis s/p left ureteral stenting (03/16/2024) who is admitted with sigmoid diverticulitis with contained perforation.  Patient is started empirically on IV Zosyn  therapy, pain control, general surgery consulted. General surgery advised oral antibiotics for 7 days, repeat CT abdomen pelvis remained stable.  She is hemodynamically stable to be discharged home with PCP follow-up.  Assessment and Plan: Perforation of sigmoid colon due to diverticulitis: Patient is seen by general surgery team, continue conservative management. Patient got IV Zosyn  therapy while hospitalized. IV Zosyn  changed to Augmentin  for 7 more days.  Her WBC improved, symptoms much improved. Repeat CT scan of the abdomen pelvis with contrast remained stable. Patient is able to tolerate diet, wishes to go home.  I advised her to follow-up PCP upon discharge as instructed.  Patient understands and agrees with the discharge plan.   Depression- Continue fluoxetine    Essential hypertension Blood pressure stable, resumed home dose lisinopril    Hyperlipidemia: Resumed home Crestor    History of ureteral stenting in November 2024: CT showed resolution of hydronephrosis. Urology follow-up as scheduled.       Consultants: General  surgery Procedures performed: None Disposition: Home Diet recommendation:  Discharge Diet Orders (From admission, onward)     Start     Ordered   04/01/24 0000  Diet - low sodium heart healthy        04/01/24 1131           Cardiac diet DISCHARGE MEDICATION: Allergies as of 04/01/2024       Reactions   Latex Rash        Medication List     STOP taking these medications    doxycycline 100 MG tablet Commonly known as: ADOXA       TAKE these medications    amoxicillin -clavulanate 400-57 MG/5ML suspension Commonly known as: AUGMENTIN  Take 10 mLs (800 mg total) by mouth 2 (two) times daily for 7 days.   aspirin  EC 81 MG tablet Take 81 mg by mouth daily. Swallow whole.   CALCIUM  + VITAMIN D3 PO Take 1 tablet by mouth daily.   carboxymethylcellulose 1 % ophthalmic solution Place 1 drop into both eyes daily as needed (Moisturing eye).   DENOSUMAB [place] Inject into the skin every 6 (six) months.   FLUoxetine  20 MG capsule Commonly known as: PROZAC  Take 1 capsule (20 mg total) by mouth daily.   lisinopril  10 MG tablet Commonly known as: ZESTRIL  TAKE 1 TABLET DAILY   multivitamin capsule Take 1 capsule by mouth daily.   ondansetron  4 MG disintegrating tablet Commonly known as: ZOFRAN -ODT Take 1 tablet (4 mg total) by mouth every 8 (eight) hours as needed for nausea or vomiting.   oxyCODONE -acetaminophen  5-325 MG tablet Commonly known as: PERCOCET/ROXICET Take 1-2 tablets by mouth every 6 (six) hours as needed for up to 18 doses for severe pain (pain  score 7-10).   rosuvastatin  20 MG tablet Commonly known as: CRESTOR  Take 20 mg by mouth at bedtime.   tamsulosin  0.4 MG Caps capsule Commonly known as: FLOMAX  Take 1 capsule (0.4 mg total) by mouth 2 (two) times daily. What changed:  when to take this reasons to take this        Follow-up Information     Cesario Mutton, MD Follow up.   Specialty: Internal Medicine Why: As needed Contact  information: [id number] [place] [address] [phone]                Discharge Exam: Filed Weights   03/25/24 1456  Weight: 69.9 kg      04/01/2024    5:29 AM 03/31/2024    8:28 PM 03/31/2024    6:29 PM  Vitals with BMI  Systolic 140   140 140 145  Diastolic 68   68 79 76  Pulse 86   87 88 91   General - Elderly Caucasian female, no apparent distress HEENT - PERRLA, EOMI, atraumatic head, non tender sinuses. Lung - Clear, no rales, rhonchi, wheezes. Heart - S1, S2 heard, no murmurs, rubs, no pedal edema. Abdomen - Soft, non tender, bowel sounds good Neuro - Alert, awake and oriented x 3, non focal exam. Skin - Warm and dry.  Condition at discharge: stable  The results of significant diagnostics from this hospitalization (including imaging, microbiology, ancillary and laboratory) are listed below for reference.   Imaging Studies: CT ABDOMEN PELVIS W CONTRAST Result Date: 03/31/2024 CLINICAL DATA:  Intra-abdominal abscess. EXAM: CT ABDOMEN AND PELVIS WITH CONTRAST TECHNIQUE: Multidetector CT imaging of the abdomen and pelvis was performed using the standard protocol following bolus administration of intravenous contrast. RADIATION DOSE REDUCTION: This exam was performed according to the departmental dose-optimization program which includes automated exposure control, adjustment of the mA and/or kV according to patient size and/or use of iterative reconstruction technique. CONTRAST:  OMNIPAQUE  IOHEXOL  300 MG/ML  SOLN COMPARISON:  CT dated 03/25/2024. FINDINGS: Lower chest: Bibasilar atelectasis. No intra-abdominal free air.  Small free fluid in the pelvis. Hepatobiliary: The liver is unremarkable. No biliary dilatation. Layering clusters of stones in the gallbladder. No pericholecystic fluid or evidence of acute cholecystitis by CT. Pancreas: Unremarkable. No pancreatic ductal dilatation or surrounding inflammatory changes. Spleen: Normal in size  without focal abnormality. Adrenals/Urinary Tract: The adrenal glands are unremarkable. There is mild left hydronephrosis. Left ureteral stent with proximal tip in the renal pelvis and distal end in the urinary bladder. The right kidney is unremarkable. There is symmetric enhancement and excretion of contrast by both kidneys. The urinary bladder is unremarkable. Stomach/Bowel: There is segmental thickening of the rectosigmoid. Several small sigmoid diverticula noted. No significant interval change in the size of the loculated pocket of air adjacent to the sigmoid colon consistent with a focally contained perforation. No fluid collection or abscess. Interval decrease in the inflammatory infiltrate within the mesentery. There is no bowel obstruction. The appendix is normal. Vascular/Lymphatic: Mild aortoiliac atherosclerotic disease. The IVC is unremarkable. No portal venous gas. There is no adenopathy. Reproductive: The uterus is anteverted. Calcified uterine fibroids noted. No suspicious adnexal mass. Other: None Musculoskeletal: Right femoral neck screws. No acute osseous pathology. IMPRESSION: 1. No significant interval change in the size of the loculated pocket of air adjacent to the sigmoid colon consistent with a focally contained perforation. No fluid collection or abscess. 2. Mild left hydronephrosis with left ureteral stent in place.  3. Cholelithiasis. 4.  Aortic Atherosclerosis (ICD10-I70.0). Electronically Signed   By: Vanetta Chou M.D.   On: 03/31/2024 15:42   CT ABDOMEN PELVIS WO CONTRAST Result Date: 03/25/2024 EXAM: CT ABDOMEN AND PELVIS WITHOUT CONTRAST 03/25/2024 08:10:27 PM TECHNIQUE: CT of the abdomen and pelvis was performed without the administration of intravenous contrast. Multiplanar reformatted images are provided for review. Automated exposure control, iterative reconstruction, and/or weight-based adjustment of the mA/kV was utilized to reduce the radiation dose to as low as  reasonably achievable. COMPARISON: Comparison with the 03/08/2024. CLINICAL HISTORY: Abdominal pain, acute, nonlocalized. FINDINGS: LOWER CHEST: No acute abnormality. LIVER: The liver is unremarkable. GALLBLADDER AND BILE DUCTS: Cholelithiasis without acute cholecystitis. No biliary ductal dilatation. SPLEEN: No acute abnormality. PANCREAS: No acute abnormality. ADRENAL GLANDS: No acute abnormality. KIDNEYS, URETERS AND BLADDER: Left ureteral stent. No stones in the kidneys or ureters. No hydronephrosis. No perinephric or periureteral stranding. Urinary bladder is unremarkable. GI AND BOWEL: Stomach demonstrates no acute abnormality. Wall thickening about loops of small bowel in the low pelvis. Extensive mesenteric edema and vascular engorgement surrounding a focus of gas extraluminal measuring 3 x 2 cm (series 2 image 58). This is favored to arise from a perforated sigmoid diverticulum (series 8 image 60). There is no bowel obstruction. PERITONEUM AND RETROPERITONEUM: No ascites. No free air. VASCULATURE: Aorta is normal in caliber. LYMPH NODES: No lymphadenopathy. REPRODUCTIVE ORGANS: Fibroid uterus. BONES AND SOFT TISSUES: Screw fixation of the right femoral neck. No acute osseous abnormality. No focal soft tissue abnormality. IMPRESSION: 1. Perforated sigmoid diverticulitis with contained perforation in the low anterior pelvis. 2. Wall thickening about loops of small bowel in the low pelvis is favored reactive secondary to perforated sigmoid diverticulitis. 3. Resolution of the previous hydronephrosis after placement of a left ureteral stent. Critical value/emergent results were called by telephone at the time of interpretation on 03/25/24 @ 9:10 PM to Dr. Ula, who verbally acknowledged these results. Electronically signed by: Norman Gatlin MD 03/25/2024 09:13 PM EST RP Workstation: [id number]   DG C-Arm 1-60 Min-No Report Result Date: 03/16/2024 Fluoroscopy was utilized by the requesting physician.  No  radiographic interpretation.   CT Renal Stone Study Result Date: 03/08/2024 EXAM: CT ABDOMEN AND PELVIS WITHOUT CONTRAST 03/08/2024 02:13:45 AM TECHNIQUE: CT of the abdomen and pelvis was performed without the administration of intravenous contrast. Multiplanar reformatted images are provided for review. Automated exposure control, iterative reconstruction, and/or weight-based adjustment of the mA/kV was utilized to reduce the radiation dose to as low as reasonably achievable. COMPARISON: CT without contrast 03/22/2009. CLINICAL HISTORY: Abdominal/flank pain, stone suspected. FINDINGS: LOWER CHEST: There is scarring in the lung bases. There are small posterior diaphragmatic fat herniations. The cardiac size is normal. LIVER: The liver is unremarkable. GALLBLADDER AND BILE DUCTS: There are layering stones in the distal gallbladder. No wall thickening. No biliary ductal dilatation. SPLEEN: No acute abnormality. PANCREAS: No acute abnormality. ADRENAL GLANDS: No adrenal mass. KIDNEYS, URETERS AND BLADDER: On the left, there is asymmetric perinephric edema and renal edema and moderate hydronephrosis due to a 9 x 5 x 5 mm UPJ stone. There is stranding along the renal pelvis. No contour deforming mass of the unenhanced bilateral kidneys. No stones in the right kidney , right ureter or left renal calyces . No right hydronephrosis. No right perinephric or periureteral stranding. Both ureters are otherwise clear. Urinary bladder is unremarkable. GI AND BOWEL: Stomach demonstrates no acute abnormality. There is no bowel obstruction. There is chronic haziness of the mesenteric root  fat. PERITONEUM AND RETROPERITONEUM: No ascites. No free air. VASCULATURE: Aorta is normal in caliber. There is mild aortic atherosclerosis without aneurysm. LYMPH NODES: No lymphadenopathy. REPRODUCTIVE ORGANS: The uterus is lobulated and myomatous. There are calcified fibroids on the right. The ovaries are not enlarged. BONES AND SOFT TISSUES:  Degenerative change and mild dextroscoliosis thoracic spine with osteopenia. Interval right hip pinning and asymmetric mild right hip djd. 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 There is an umbilical fat hernia. Otherwise, no focal soft tissue abnormality. IMPRESSION: 1. Moderate left hydronephrosis due to a 9 x 5 x 5 mm UPJ stone, with associated asymmetric perinephric and renal edema and stranding along the renal pelvis. 2. No intrarenal stones bilaterally. No further ureteral stones. 3. Cholelithiasis without cholecystitis findings or biliary dilatation. 4. Small umbilical fat  hernia. 5. Chronic haziness of the mesenteric root fat . Electronically signed by: Francis Quam MD 03/08/2024 02:41 AM EST RP Workstation: [id number]    Microbiology: Results for orders placed or performed during the hospital encounter of 04/28/20  SARS CORONAVIRUS 2 (TAT 6-24 HRS) Nasopharyngeal Nasopharyngeal Swab     Status: None   Collection Time: 04/28/20  8:38 AM   Specimen: Nasopharyngeal Swab  Result Value Ref Range Status   SARS Coronavirus 2 NEGATIVE NEGATIVE Final    Comment: (NOTE) SARS-CoV-2 target nucleic acids are NOT DETECTED.  The SARS-CoV-2 RNA is generally detectable in upper and lower respiratory specimens during the acute phase of infection. Negative results do not preclude SARS-CoV-2 infection, do not rule out co-infections with other pathogens, and should not be used as the sole basis for treatment or other patient management decisions. Negative results must be combined with clinical observations, patient history, and epidemiological information. The expected result is Negative.  Fact Sheet for Patients: hairslick.no  Fact Sheet for Healthcare Providers: quierodirigir.com  This test is not yet approved or cleared by the [place]  FDA and  has been authorized for detection and/or diagnosis of SARS-CoV-2 by FDA under an  Emergency Use Authorization (EUA). This EUA will remain  in effect (meaning this test can be used) for the duration of the COVID-19 declaration under Se ction 564(b)(1) of the Act, 21 U.S.C. section 360bbb-3(b)(1), unless the authorization is terminated or revoked sooner.  Performed at [facility], [address] [place], [place] [address]     Labs: CBC: Recent Labs  Lab 03/25/24 1532 03/26/24 0430 03/28/24 0929 03/29/24 0508 03/31/24 0446  WBC 18.6* 11.9* 7.6 8.9 11.2*  HGB 11.2* 10.0* 10.5* 10.9* 11.4*  HCT 33.8* 31.2* 32.2* 33.6* 34.6*  MCV 97.4 98.7 97.9 96.8 96.4  PLT 345 309 348 385 395   Basic Metabolic Panel: Recent Labs  Lab 03/25/24 1532 03/26/24 0430  NA 136 139  K 4.2 4.2  CL 101 103  CO2 22 25  GLUCOSE 124* 89  BUN 17 18  CREATININE 0.77 0.77  CALCIUM  9.1 8.7*   Liver Function Tests: Recent Labs  Lab 03/25/24 1532  AST 26  ALT 27  ALKPHOS 135*  BILITOT 0.8  PROT 7.1  ALBUMIN 3.7   CBG: No results for input(s): GLUCAP in the last 168 hours.  Discharge time spent: 36 minutes.  Signed: Concepcion Riser, MD [facility] 04/01/2024

## 2024-04-02 ENCOUNTER — Other Ambulatory Visit (HOSPITAL_COMMUNITY): Payer: Self-pay
